# Patient Record
Sex: Female | Born: 1980 | Race: White | Hispanic: No | Marital: Married | Smoking: Never smoker
Health system: Southern US, Community
[De-identification: ages and names within clinical notes are randomized; demographics above are authoritative.]

## PROBLEM LIST (undated history)

## (undated) DIAGNOSIS — Z9221 Personal history of antineoplastic chemotherapy: Secondary | ICD-10-CM

## (undated) DIAGNOSIS — K219 Gastro-esophageal reflux disease without esophagitis: Secondary | ICD-10-CM

## (undated) DIAGNOSIS — J45909 Unspecified asthma, uncomplicated: Secondary | ICD-10-CM

## (undated) DIAGNOSIS — Z923 Personal history of irradiation: Secondary | ICD-10-CM

## (undated) DIAGNOSIS — C50411 Malignant neoplasm of upper-outer quadrant of right female breast: Secondary | ICD-10-CM

## (undated) DIAGNOSIS — Z170 Estrogen receptor positive status [ER+]: Secondary | ICD-10-CM

## (undated) DIAGNOSIS — R030 Elevated blood-pressure reading, without diagnosis of hypertension: Secondary | ICD-10-CM

## (undated) DIAGNOSIS — C50919 Malignant neoplasm of unspecified site of unspecified female breast: Secondary | ICD-10-CM

## (undated) DIAGNOSIS — Z1379 Encounter for other screening for genetic and chromosomal anomalies: Principal | ICD-10-CM

## (undated) HISTORY — PX: NO PAST SURGERIES: SHX2092

## (undated) HISTORY — DX: Encounter for other screening for genetic and chromosomal anomalies: Z13.79

## (undated) HISTORY — DX: Malignant neoplasm of unspecified site of unspecified female breast: C50.919

---

## 2015-11-28 ENCOUNTER — Encounter: Payer: Self-pay | Admitting: Podiatry

## 2015-11-28 ENCOUNTER — Ambulatory Visit (INDEPENDENT_AMBULATORY_CARE_PROVIDER_SITE_OTHER): Payer: 59

## 2015-11-28 ENCOUNTER — Ambulatory Visit (INDEPENDENT_AMBULATORY_CARE_PROVIDER_SITE_OTHER): Payer: 59 | Admitting: Podiatry

## 2015-11-28 VITALS — BP 119/86 | HR 110 | Resp 18

## 2015-11-28 DIAGNOSIS — M6740 Ganglion, unspecified site: Secondary | ICD-10-CM | POA: Diagnosis not present

## 2015-11-28 DIAGNOSIS — R52 Pain, unspecified: Secondary | ICD-10-CM

## 2015-11-28 NOTE — Patient Instructions (Signed)
Ganglion Cyst  A ganglion cyst is a noncancerous, fluid-filled lump that occurs near joints or tendons. The ganglion cyst grows out of a joint or the lining of a tendon. It most often develops in the hand or wrist, but it can also develop in the shoulder, elbow, hip, knee, ankle, or foot. The round or oval ganglion cyst can be the size of a pea or larger than a grape. Increased activity may enlarge the size of the cyst because more fluid starts to build up.   CAUSES  It is not known what causes a ganglion cyst to grow. However, it may be related to:  · Inflammation or irritation around the joint.  · An injury.  · Repetitive movements or overuse.  · Arthritis.  RISK FACTORS  Risk factors include:  · Being a woman.  · Being age 20-50.  SIGNS AND SYMPTOMS  Symptoms may include:   · A lump. This most often appears on the hand or wrist, but it can occur in other areas of the body.  · Tingling.  · Pain.  · Numbness.  · Muscle weakness.  · Weak grip.  · Less movement in a joint.  DIAGNOSIS  Ganglion cysts are most often diagnosed based on a physical exam. Your health care provider will feel the lump and may shine a light alongside it. If it is a ganglion cyst, a light often shines through it. Your health care provider may order an X-ray, ultrasound, or MRI to rule out other conditions.  TREATMENT  Ganglion cysts usually go away on their own without treatment. If pain or other symptoms are involved, treatment may be needed. Treatment is also needed if the ganglion cyst limits your movement or if it gets infected. Treatment may include:  · Wearing a brace or splint on your wrist or finger.  · Taking anti-inflammatory medicine.  · Draining fluid from the lump with a needle (aspiration).  · Injecting a steroid into the joint.  · Surgery to remove the ganglion cyst.  HOME CARE INSTRUCTIONS  · Do not press on the ganglion cyst, poke it with a needle, or hit it.  · Take medicines only as directed by your health care  provider.  · Wear your brace or splint as directed by your health care provider.  · Watch your ganglion cyst for any changes.  · Keep all follow-up visits as directed by your health care provider. This is important.  SEEK MEDICAL CARE IF:  · Your ganglion cyst becomes larger or more painful.  · You have increased redness, red streaks, or swelling.  · You have pus coming from the lump.  · You have weakness or numbness in the affected area.  · You have a fever or chills.     This information is not intended to replace advice given to you by your health care provider. Make sure you discuss any questions you have with your health care provider.     Document Released: 06/28/2000 Document Revised: 07/22/2014 Document Reviewed: 12/14/2013  Elsevier Interactive Patient Education ©2016 [employer]

## 2015-11-28 NOTE — Progress Notes (Signed)
   Subjective:    Patient ID: Anne Wu, female    DOB: October 08, 1980, 35 y.o.   MRN: [id number]  HPI  2 she'll female presents the also concerns of a knot on the top of her left foot which is been ongoing for about a month however she states that it does not hurt and does not cause any discomfort was shoes or with pressure. No redness over the area no recent injury or trauma. She is in no previous treatment. She states that she will get a check to see what it is.   Review of Systems  All other systems reviewed and are negative.      Objective:   Physical Exam General: AAO x3, NAD  Dermatological: Skin is warm, dry and supple bilateral. Nails x 10 are well manicured; remaining integument appears unremarkable at this time. There are no open sores, no preulcerative lesions, no rash or signs of infection present.  Vascular: Dorsalis Pedis artery and Posterior Tibial artery pedal pulses are 2/4 bilateral with immedate capillary fill time. Pedal hair growth present. No varicosities and no lower extremity edema present bilateral. There is no pain with calf compression, swelling, warmth, erythema.   Neruologic: Grossly intact via light touch bilateral. Vibratory intact via tuning fork bilateral. Protective threshold with Semmes Wienstein monofilament intact to all pedal sites bilateral. Patellar and Achilles deep tendon reflexes 2+ bilateral. No Babinski or clonus noted bilateral.   Musculoskeletal: On the dorsal lateral aspect of left foot approximately Lisfranc joint is a lateral 1.2 x 1 cm fluid-filled mobile soft tissue mass appears to be more of a ganglion cyst. There is a small bony exostosis palpable underneath this area. There is no overlying erythema or increase in warmth. There is no skin breakdown. No pain, crepitus, or limitation noted with foot and ankle range of motion bilateral. Muscular strength 5/5 in all groups tested bilateral.  Gait: Unassisted, Nonantalgic.       Assessment  & Plan:  35 year-old female left foot ganglion cyst likely -Treatment options discussed including all alternatives, risks, and complications -Etiology of symptoms were discussed -X-rays were obtained and reviewed with the patient. Nose of foreign body. No acute osseous deformity is present. -I discussed with the only way to definitively no is to remove the mass or to tried aspirated. She wishes to hold off on this for now. She'll continue to monitor. If it becomes painful or increases in size she'll call the office.  Celesta Gentile, DPM

## 2016-07-15 DIAGNOSIS — C50919 Malignant neoplasm of unspecified site of unspecified female breast: Secondary | ICD-10-CM

## 2016-07-15 HISTORY — DX: Malignant neoplasm of unspecified site of unspecified female breast: C50.919

## 2017-06-10 ENCOUNTER — Other Ambulatory Visit: Payer: Self-pay | Admitting: Nurse Practitioner

## 2017-06-10 DIAGNOSIS — N6310 Unspecified lump in the right breast, unspecified quadrant: Secondary | ICD-10-CM

## 2017-06-12 ENCOUNTER — Other Ambulatory Visit: Payer: Self-pay | Admitting: Nurse Practitioner

## 2017-06-12 ENCOUNTER — Ambulatory Visit: Payer: Self-pay | Admitting: Physician Assistant

## 2017-06-12 DIAGNOSIS — N6310 Unspecified lump in the right breast, unspecified quadrant: Secondary | ICD-10-CM

## 2017-06-20 ENCOUNTER — Other Ambulatory Visit: Payer: Self-pay | Admitting: Nurse Practitioner

## 2017-06-20 ENCOUNTER — Ambulatory Visit
Admission: RE | Admit: 2017-06-20 | Discharge: 2017-06-20 | Disposition: A | Discharge status: Home / Self Care | Payer: 59 | Source: Ambulatory Visit | Attending: Nurse Practitioner | Admitting: Nurse Practitioner

## 2017-06-20 ENCOUNTER — Encounter: Payer: Self-pay | Admitting: Radiology

## 2017-06-20 DIAGNOSIS — R928 Other abnormal and inconclusive findings on diagnostic imaging of breast: Secondary | ICD-10-CM

## 2017-06-20 DIAGNOSIS — N6310 Unspecified lump in the right breast, unspecified quadrant: Secondary | ICD-10-CM

## 2017-06-20 DIAGNOSIS — N63 Unspecified lump in unspecified breast: Secondary | ICD-10-CM | POA: Diagnosis present

## 2017-06-20 DIAGNOSIS — C50411 Malignant neoplasm of upper-outer quadrant of right female breast: Secondary | ICD-10-CM | POA: Diagnosis not present

## 2017-06-20 DIAGNOSIS — Z170 Estrogen receptor positive status [ER+]: Secondary | ICD-10-CM | POA: Diagnosis not present

## 2017-06-20 HISTORY — PX: BREAST BIOPSY: SHX20

## 2017-06-23 ENCOUNTER — Other Ambulatory Visit: Payer: Self-pay | Admitting: Pathology

## 2017-06-26 NOTE — Progress Notes (Signed)
  Oncology Nurse Navigator Documentation  Navigator Location: [facility] (06/26/17 1600)   )Navigator Encounter Type: Introductory phone call (06/26/17 1600)   Abnormal Finding Date: 06/20/17 (06/26/17 1600) Confirmed Diagnosis Date: 06/20/17 (06/26/17 1600)                   Barriers/Navigation Needs: Education;Coordination of Care (06/26/17 1600)   Interventions: Coordination of Care;Education (06/26/17 1600)   Coordination of Care: Appts (06/26/17 1600) Education Method: Written;Verbal (06/26/17 1600)                Time Spent with Patient: 90 (06/26/17 1600)   Introduced patient to IT trainer. Met with her after work to give her Breast Cancer Treatment Handbook/folder with hospital services.  Confirmed appointments with Dr. [name], and Dr. Tasia Catchings. Patient has strong family history of breast cancer.  Aunt tested BRCA negative.  Patient has information.

## 2017-07-01 NOTE — Progress Notes (Signed)
Hematology/Oncology Consult note [facility] [phone] [phone]   Patient Care Team: [name], NP as PCP - General (Nurse Practitioner)  REFERRING PROVIDER: [name], NP CHIEF COMPLAINTS/PURPOSE OF CONSULTATION:  Evaluation of breast cancer   HISTORY OF PRESENTING ILLNESS:  Anne Wu is a  36 y.o.  female with PMH listed below who was referred to me for evaluation of newly diagnosed breast cancer.  She has significant family history on both maternal side as well as paternal side. She felt a right breast mass 2 months ago and diagnostic mammogram showed right upper quandrant 2.2 cm mass, which was confirmed on [place] and biopsied. Right axillary no clinically suspicious lymph node. Pathology showed invasive mammary carcinoma, ER/PR positive,, HER2 IHC  equivocal, FISH negative.  Patient has been evaluated by Dr.Smith, lumpectomy vs mastectomy options were discussed.   Patient is married, no children. She has no complaints today. Parents and husband accompanied her to clinic today.   Review of Systems  Constitutional: Negative for chills, fever and weight loss.  HENT: Negative for hearing loss.   Eyes: Negative for blurred vision.  Respiratory: Negative for cough.   Cardiovascular: Negative for chest pain.  Gastrointestinal: Negative for heartburn.  Genitourinary: Negative for dysuria.  Musculoskeletal: Negative for myalgias.  Skin: Negative for rash.  Neurological: Negative for dizziness.  Endo/Heme/Allergies: Does not bruise/bleed easily.  Psychiatric/Behavioral: Negative for depression.    MEDICAL HISTORY:  Past Medical History:  Diagnosis Date  . Breast cancer ([place])     SURGICAL HISTORY: History reviewed. No pertinent surgical history.  SOCIAL HISTORY: Social History   Socioeconomic History  . Marital status: Married    Spouse name: Not on file  . Number of children: Not on file  . Years of  education: Not on file  . Highest education level: Not on file  Social Needs  . Financial resource strain: Not on file  . Food insecurity - worry: Not on file  . Food insecurity - inability: Not on file  . Transportation needs - medical: Not on file  . Transportation needs - non-medical: Not on file  Occupational History  . Not on file  Tobacco Use  . Smoking status: Never Smoker  . Smokeless tobacco: Never Used  Substance and Sexual Activity  . Alcohol use: No    Alcohol/week: 0.0 oz  . Drug use: No  . Sexual activity: Yes  Other Topics Concern  . Not on file  Social History Narrative  . Not on file    FAMILY HISTORY: Family History  Problem Relation Age of Onset  . Breast cancer Maternal Aunt   . Breast cancer Maternal Grandmother   . Breast cancer Cousin   . Cancer Maternal Uncle   . Breast cancer Paternal Aunt   . Breast cancer Paternal Grandmother     ALLERGIES:  is allergic to codeine.  MEDICATIONS:  Current Outpatient Medications  Medication Sig Dispense Refill  . acetaminophen (TYLENOL) 325 MG tablet Take 650 mg by mouth every 6 (six) hours as needed.    . Multiple Vitamin (MULTIVITAMIN) tablet Take 1 tablet by mouth daily.     No current facility-administered medications for this visit.      PHYSICAL EXAMINATION: ECOG PERFORMANCE STATUS: 0 - Asymptomatic Vitals:   07/02/17 1047  BP: (!) 148/93  Pulse: (!) 105  Resp: 16  Temp: 98.5 F (36.9 C)   Filed Weights   07/02/17 1047  Weight: 168 lb (76.2 kg)  Physical Exam  Constitutional: She is oriented to person, place, and time and well-developed, well-nourished, and in no distress. No distress.  HENT:  Head: Normocephalic and atraumatic.  Eyes: EOM are normal. Pupils are equal, round, and reactive to light. Left eye exhibits no discharge. No scleral icterus.  Neck: Normal range of motion. Neck supple. No JVD present.  Cardiovascular: Normal rate, regular rhythm and normal heart sounds.  No  murmur heard. Pulmonary/Chest: Effort normal and breath sounds normal. No respiratory distress.  Abdominal: Soft. Bowel sounds are normal. She exhibits no distension.  Musculoskeletal: Normal range of motion. She exhibits no edema.  Lymphadenopathy:    She has no cervical adenopathy.  Neurological: She is alert and oriented to person, place, and time.  Skin: Skin is warm and dry.  Psychiatric: Affect and judgment normal.  Breast exam was performed in seated and lying down position. No nipple discharge. Right breast 10'oclock palpable mobile 2cm mass. No evidence of right axillary adenopathy No evidence of any palpable mass on left breast. No evidence of left axillary adenopathy.    No results found for: WBC, HGB, HCT, MCV, PLT No results for input(s): NA, K, CL, CO2, GLUCOSE, BUN, CREATININE, CALCIUM, GFRNONAA, GFRAA, PROT, ALBUMIN, AST, ALT, ALKPHOS, BILITOT, BILIDIR, IBILI in the last 8760 hours.     ASSESSMENT & PLAN:  1. Malignant neoplasm of upper-outer quadrant of right breast in female, estrogen receptor positive ([place])    cT2N0M0, stage IIA disease, grade 2 Image results and pathological reports discussed with patient. Treatment options was pending on discussion with surgery.  Discussed with Dr.Smith and he feels that neoadjuvant chemotherapy may help to achieve better margin status. Will offer neoadjuvant chemotherapy with AC->T. Chemotherapy was discussed with patient and her family members. Patient does not desire fertility. Discussed with patient and advice her to see gyn physician to discuss about non hormone contraceptive options. Will test urine HCG prior to chemotherapy.   Refer to Ina for Medi port placement and chemotherapy class. Will meet with patient and discuss more details about chemotherapy    All questions were answered. The patient knows to call the clinic with any problems questions or concerns.  Return visit as scheduled.  Thank you for this kind  referral and the opportunity to participate in the care of this patient. A copy of today's note is routed to referring provider    [name], MD, PhD Hematology Oncology [facility] Pager- [phone] 07/02/2017

## 2017-07-02 ENCOUNTER — Encounter: Payer: Self-pay | Admitting: *Deleted

## 2017-07-02 ENCOUNTER — Encounter: Payer: Self-pay | Admitting: Oncology

## 2017-07-02 ENCOUNTER — Inpatient Hospital Stay: Payer: 59 | Attending: Oncology | Admitting: Oncology

## 2017-07-02 VITALS — BP 148/93 | HR 105 | Temp 98.5°F | Resp 16 | Ht 64.3 in | Wt 168.0 lb

## 2017-07-02 DIAGNOSIS — C50411 Malignant neoplasm of upper-outer quadrant of right female breast: Secondary | ICD-10-CM | POA: Diagnosis present

## 2017-07-02 DIAGNOSIS — Z170 Estrogen receptor positive status [ER+]: Secondary | ICD-10-CM

## 2017-07-02 LAB — SURGICAL PATHOLOGY

## 2017-07-02 NOTE — Progress Notes (Signed)
New consult for initial evaluation and treatment planning. Patient states that she feels well and denies having any pain.

## 2017-07-03 ENCOUNTER — Encounter: Payer: Self-pay | Admitting: Diagnostic Radiology

## 2017-07-06 ENCOUNTER — Encounter: Payer: Self-pay | Admitting: Oncology

## 2017-07-06 NOTE — Progress Notes (Signed)
START ON PATHWAY REGIMEN - Breast   Doxorubicin + Cyclophosphamide (AC):   A cycle is every 21 days:     Doxorubicin      Cyclophosphamide   **Always confirm dose/schedule in your pharmacy ordering system**    Paclitaxel 80 mg/m2 Weekly:   Administer weekly:     Paclitaxel   **Always confirm dose/schedule in your pharmacy ordering system**    Patient Characteristics: Preoperative or Nonsurgical Candidate (Clinical Staging), Neoadjuvant Therapy followed by Surgery, Invasive Disease, Chemotherapy, HER2 Negative/Unknown/Equivocal, ER Positive Therapeutic Status: Preoperative or Nonsurgical Candidate (Clinical Staging) AJCC M Category: cM0 AJCC Grade: G2 Breast Surgical Plan: Neoadjuvant Therapy followed by Surgery ER Status: Positive (+) AJCC 8 Stage Grouping: IB HER2 Status: Negative (-) AJCC T Category: cT2 AJCC N Category: cN0 PR Status: Positive (+) Intent of Therapy: Curative Intent, Discussed with Patient 

## 2017-07-09 ENCOUNTER — Other Ambulatory Visit: Payer: Self-pay

## 2017-07-09 ENCOUNTER — Encounter
Admission: RE | Admit: 2017-07-09 | Discharge: 2017-07-09 | Disposition: A | Discharge status: Home / Self Care | Payer: 59 | Source: Ambulatory Visit | Attending: Surgery | Admitting: Surgery

## 2017-07-09 HISTORY — DX: Unspecified asthma, uncomplicated: J45.909

## 2017-07-09 HISTORY — DX: Gastro-esophageal reflux disease without esophagitis: K21.9

## 2017-07-09 NOTE — Patient Instructions (Signed)
  Your procedure is scheduled on: 07-11-17 Report to Same Day Surgery 2nd floor medical mall [address] [facility] Entrance-take elevator on left to 2nd floor.  Check in with surgery information desk.) To find out your arrival time please call [phone] between 1PM - 3PM on 07-10-17  Remember: Instructions that are not followed completely may result in serious medical risk, up to and including death, or upon the discretion of your surgeon and anesthesiologist your surgery may need to be rescheduled.    _x___ 1. Do not eat food after midnight the night before your procedure. NO GUM OR CANDY AFTER MIDNIGHT. You may drink clear liquids up to 2 hours before you are scheduled to arrive at the hospital for your procedure.  Do not drink clear liquids within 2 hours of your scheduled arrival to the hospital.  Clear liquids include  --Water or Apple juice without pulp  --Clear carbohydrate beverage such as ClearFast or Gatorade  --Black Coffee or Clear Tea (No milk, no creamers, do not add anything to the coffee or Tea     __x__ 2. No Alcohol for 24 hours before or after surgery.   __x__3. No Smoking for 24 prior to surgery.   ____  4. Bring all medications with you on the day of surgery if instructed.    __x__ 5. Notify your doctor if there is any change in your medical condition     (cold, fever, infections).     Do not wear jewelry, make-up, hairpins, clips or nail polish.  Do not wear lotions, powders, or perfumes. You may wear deodorant.  Do not shave 48 hours prior to surgery. Men may shave face and neck.  Do not bring valuables to the hospital.    [facility] is not responsible for any belongings or valuables.               Contacts, dentures or bridgework may not be worn into surgery.  Leave your suitcase in the car. After surgery it may be brought to your room.  For patients admitted to the hospital, discharge time is determined by your treatment team.   Patients discharged the day of  surgery will not be allowed to drive home.  You will need someone to drive you home and stay with you the night of your procedure.     ____ Take anti-hypertensive listed below, cardiac, seizure, asthma, anti-reflux and psychiatric medicines. These include:  1. NONE  2.  3.  4.  5.  6.  ____Fleets enema or Magnesium Citrate as directed.   ____ Use CHG Soap or sage wipes as directed on instruction sheet   ____ Use inhalers on the day of surgery and bring to hospital day of surgery  ____ Stop Metformin and Janumet 2 days prior to surgery.    ____ Take 1/2 of usual insulin dose the night before surgery and none on the morning surgery.   ____ Follow recommendations from Cardiologist, Pulmonologist or PCP regarding stopping Aspirin, Coumadin, Plavix ,Eliquis, Effient, or Pradaxa, and Pletal.  X____Stop Anti-inflammatories such as Advil, Aleve, Ibuprofen, Motrin, Naproxen, Naprosyn, Goodies powders or aspirin products NOW-OK to take Tylenol-PT HAS ALREADY STOPPED HER IBUPROFEN   ____ Stop supplements until after surgery.     ____ Bring C-Pap to the hospital.

## 2017-07-10 ENCOUNTER — Encounter: Payer: Self-pay | Admitting: *Deleted

## 2017-07-10 MED ORDER — CEFAZOLIN SODIUM-DEXTROSE 2-4 GM/100ML-% IV SOLN
2.0000 g | Freq: Once | INTRAVENOUS | Status: AC
  Administered 2017-07-11: 2 g via INTRAVENOUS

## 2017-07-11 ENCOUNTER — Encounter
Admission: RE | Disposition: A | Discharge status: Home / Self Care | Payer: Self-pay | Source: Ambulatory Visit | Attending: Surgery

## 2017-07-11 ENCOUNTER — Ambulatory Visit: Payer: 59

## 2017-07-11 ENCOUNTER — Encounter: Payer: Self-pay | Admitting: Anesthesiology

## 2017-07-11 ENCOUNTER — Other Ambulatory Visit: Payer: Self-pay

## 2017-07-11 ENCOUNTER — Ambulatory Visit: Payer: 59 | Admitting: Certified Registered"

## 2017-07-11 ENCOUNTER — Ambulatory Visit
Admission: RE | Admit: 2017-07-11 | Discharge: 2017-07-11 | Disposition: A | Discharge status: Home / Self Care | Payer: 59 | Source: Ambulatory Visit | Attending: Surgery | Admitting: Surgery

## 2017-07-11 DIAGNOSIS — Z79899 Other long term (current) drug therapy: Secondary | ICD-10-CM | POA: Diagnosis not present

## 2017-07-11 DIAGNOSIS — Z170 Estrogen receptor positive status [ER+]: Principal | ICD-10-CM

## 2017-07-11 DIAGNOSIS — K219 Gastro-esophageal reflux disease without esophagitis: Secondary | ICD-10-CM | POA: Diagnosis not present

## 2017-07-11 DIAGNOSIS — C50911 Malignant neoplasm of unspecified site of right female breast: Secondary | ICD-10-CM | POA: Insufficient documentation

## 2017-07-11 DIAGNOSIS — J45909 Unspecified asthma, uncomplicated: Secondary | ICD-10-CM | POA: Insufficient documentation

## 2017-07-11 DIAGNOSIS — I10 Essential (primary) hypertension: Secondary | ICD-10-CM | POA: Diagnosis not present

## 2017-07-11 DIAGNOSIS — C50411 Malignant neoplasm of upper-outer quadrant of right female breast: Secondary | ICD-10-CM

## 2017-07-11 HISTORY — PX: PORTACATH PLACEMENT: SHX2246

## 2017-07-11 HISTORY — DX: Elevated blood-pressure reading, without diagnosis of hypertension: R03.0

## 2017-07-11 LAB — POCT PREGNANCY, URINE: Preg Test, Ur: NEGATIVE

## 2017-07-11 SURGERY — INSERTION, TUNNELED CENTRAL VENOUS DEVICE, WITH PORT
Anesthesia: General | Wound class: Clean

## 2017-07-11 MED ORDER — LIDOCAINE HCL (PF) 1 % IJ SOLN
INTRAMUSCULAR | Status: DC | PRN
  Administered 2017-07-11: 7 mL

## 2017-07-11 MED ORDER — HYDROCODONE-ACETAMINOPHEN 5-325 MG PO TABS
1.0000 | ORAL_TABLET | Freq: Once | ORAL | Status: AC
Start: 2017-07-11 — End: 2017-07-11
  Administered 2017-07-11: 1 via ORAL

## 2017-07-11 MED ORDER — FAMOTIDINE 20 MG PO TABS
20.0000 mg | ORAL_TABLET | Freq: Once | ORAL | Status: AC
  Administered 2017-07-11: 20 mg via ORAL

## 2017-07-11 MED ORDER — PROPOFOL 10 MG/ML IV BOLUS
INTRAVENOUS | Status: AC
  Filled 2017-07-11: qty 20

## 2017-07-11 MED ORDER — GLYCOPYRROLATE 0.2 MG/ML IJ SOLN
INTRAMUSCULAR | Status: AC
  Filled 2017-07-11: qty 1

## 2017-07-11 MED ORDER — CEFAZOLIN SODIUM-DEXTROSE 2-4 GM/100ML-% IV SOLN
INTRAVENOUS | Status: AC
  Filled 2017-07-11: qty 100

## 2017-07-11 MED ORDER — HYDROCODONE-ACETAMINOPHEN 5-325 MG PO TABS
ORAL_TABLET | ORAL | Status: AC
  Administered 2017-07-11: 1 via ORAL
  Filled 2017-07-11: qty 1

## 2017-07-11 MED ORDER — LIDOCAINE 2% (20 MG/ML) 5 ML SYRINGE
INTRAMUSCULAR | Status: DC | PRN
  Administered 2017-07-11: 50 mg via INTRAVENOUS

## 2017-07-11 MED ORDER — MIDAZOLAM HCL 2 MG/2ML IJ SOLN
INTRAMUSCULAR | Status: AC
  Filled 2017-07-11: qty 2

## 2017-07-11 MED ORDER — SODIUM CHLORIDE 0.9 % IJ SOLN
INTRAMUSCULAR | Status: AC
  Filled 2017-07-11: qty 50

## 2017-07-11 MED ORDER — PROPOFOL 10 MG/ML IV BOLUS
INTRAVENOUS | Status: DC | PRN
  Administered 2017-07-11: 20 mg via INTRAVENOUS

## 2017-07-11 MED ORDER — FAMOTIDINE 20 MG PO TABS
ORAL_TABLET | ORAL | Status: AC
  Filled 2017-07-11: qty 1

## 2017-07-11 MED ORDER — HYDROCODONE-ACETAMINOPHEN 5-325 MG PO TABS: 1.0000 | ORAL_TABLET | Freq: Four times a day (QID) | ORAL | 0 refills | Status: DC | PRN

## 2017-07-11 MED ORDER — DEXAMETHASONE SODIUM PHOSPHATE 10 MG/ML IJ SOLN
INTRAMUSCULAR | Status: DC | PRN
  Administered 2017-07-11: 5 mg via INTRAVENOUS

## 2017-07-11 MED ORDER — FENTANYL CITRATE (PF) 100 MCG/2ML IJ SOLN
INTRAMUSCULAR | Status: DC | PRN
  Administered 2017-07-11 (×4): 50 ug via INTRAVENOUS

## 2017-07-11 MED ORDER — LACTATED RINGERS IV SOLN
INTRAVENOUS | Status: DC
  Administered 2017-07-11: 13:00:00 via INTRAVENOUS

## 2017-07-11 MED ORDER — ONDANSETRON HCL 4 MG/2ML IJ SOLN
INTRAMUSCULAR | Status: AC
  Filled 2017-07-11: qty 2

## 2017-07-11 MED ORDER — LIDOCAINE HCL (PF) 2 % IJ SOLN
INTRAMUSCULAR | Status: AC
  Filled 2017-07-11: qty 10

## 2017-07-11 MED ORDER — GLYCOPYRROLATE 0.2 MG/ML IJ SOLN
INTRAMUSCULAR | Status: DC | PRN
  Administered 2017-07-11: 0.2 mg via INTRAVENOUS

## 2017-07-11 MED ORDER — ONDANSETRON HCL 4 MG/2ML IJ SOLN: 4.0000 mg | Freq: Once | INTRAMUSCULAR | Status: DC | PRN

## 2017-07-11 MED ORDER — FENTANYL CITRATE (PF) 100 MCG/2ML IJ SOLN: 25.0000 ug | INTRAMUSCULAR | Status: DC | PRN

## 2017-07-11 MED ORDER — MIDAZOLAM HCL 5 MG/5ML IJ SOLN
INTRAMUSCULAR | Status: DC | PRN
  Administered 2017-07-11 (×2): 2 mg via INTRAVENOUS

## 2017-07-11 MED ORDER — HEPARIN SODIUM (PORCINE) 5000 UNIT/ML IJ SOLN
INTRAMUSCULAR | Status: AC
  Filled 2017-07-11: qty 1

## 2017-07-11 MED ORDER — PROPOFOL 500 MG/50ML IV EMUL
INTRAVENOUS | Status: DC | PRN
  Administered 2017-07-11: 50 ug/kg/min via INTRAVENOUS

## 2017-07-11 MED ORDER — ONDANSETRON HCL 4 MG/2ML IJ SOLN
INTRAMUSCULAR | Status: DC | PRN
  Administered 2017-07-11: 4 mg via INTRAVENOUS

## 2017-07-11 MED ORDER — FENTANYL CITRATE (PF) 100 MCG/2ML IJ SOLN
INTRAMUSCULAR | Status: AC
  Filled 2017-07-11: qty 2

## 2017-07-11 MED ORDER — LIDOCAINE HCL (PF) 1 % IJ SOLN
INTRAMUSCULAR | Status: AC
  Filled 2017-07-11: qty 30

## 2017-07-11 SURGICAL SUPPLY — 23 items
CANISTER SUCT 1200ML W/VALVE (MISCELLANEOUS) ×3 IMPLANT
CHLORAPREP W/TINT 26ML (MISCELLANEOUS) ×3 IMPLANT
COVER LIGHT HANDLE STERIS (MISCELLANEOUS) ×6 IMPLANT
DERMABOND ADVANCED (GAUZE/BANDAGES/DRESSINGS) ×2
DERMABOND ADVANCED .7 DNX12 (GAUZE/BANDAGES/DRESSINGS) ×1 IMPLANT
DRAPE C-ARM XRAY 36X54 (DRAPES) ×3 IMPLANT
ELECT REM PT RETURN 9FT ADLT (ELECTROSURGICAL) ×3
ELECTRODE REM PT RTRN 9FT ADLT (ELECTROSURGICAL) ×1 IMPLANT
GLOVE BIO SURGEON STRL SZ7.5 (GLOVE) ×3 IMPLANT
GOWN STRL REUS W/ TWL LRG LVL3 (GOWN DISPOSABLE) ×3 IMPLANT
GOWN STRL REUS W/TWL LRG LVL3 (GOWN DISPOSABLE) ×6
KIT PORT POWER 8FR ISP CVUE (Miscellaneous) ×3 IMPLANT
KIT RM TURNOVER STRD PROC AR (KITS) ×3 IMPLANT
LABEL OR SOLS (LABEL) ×3 IMPLANT
NEEDLE FILTER BLUNT 18X 1/2SAF (NEEDLE) ×2
NEEDLE FILTER BLUNT 18X1 1/2 (NEEDLE) ×1 IMPLANT
PACK PORT-A-CATH (MISCELLANEOUS) ×3 IMPLANT
SUT MNCRL+ 5-0 UNDYED PC-3 (SUTURE) ×1 IMPLANT
SUT MONOCRYL 5-0 (SUTURE) ×2
SUT SILK 4 0 SH (SUTURE) ×3 IMPLANT
SUT VIC AB 5-0 RB1 27 (SUTURE) ×3 IMPLANT
SYR 10ML LL (SYRINGE) ×3 IMPLANT
SYR 3ML LL SCALE MARK (SYRINGE) ×3 IMPLANT

## 2017-07-11 NOTE — H&P (Signed)
  She comes in today prepared for insertion of central venous catheter with subcutaneous infusion port.  We have reviewed her recent findings of right breast cancer.  Plan is to insert the catheter on the left side.  The left side was marked YES.  She reports no change in overall condition since the recent office visit.  I reviewed her recent lab work done on November 27.  I discussed the plan for surgery and also in anticipation of neoadjuvant chemotherapy

## 2017-07-11 NOTE — Patient Instructions (Signed)
Doxorubicin injection What is this medicine? DOXORUBICIN (dox oh ROO bi sin) is a chemotherapy drug. It is used to treat many kinds of cancer like leukemia, lymphoma, neuroblastoma, sarcoma, and Wilms' tumor. It is also used to treat bladder cancer, breast cancer, lung cancer, ovarian cancer, stomach cancer, and thyroid cancer. This medicine may be used for other purposes; ask your health care provider or pharmacist if you have questions. COMMON BRAND NAME(S): Adriamycin, Adriamycin PFS, Adriamycin RDF, Rubex What should I tell my health care provider before I take this medicine? They need to know if you have any of these conditions: -heart disease -history of low blood counts caused by a medicine -liver disease -recent or ongoing radiation therapy -an unusual or allergic reaction to doxorubicin, other chemotherapy agents, other medicines, foods, dyes, or preservatives -pregnant or trying to get pregnant -breast-feeding How should I use this medicine? This drug is given as an infusion into a vein. It is administered in a hospital or clinic by a specially trained health care professional. If you have pain, swelling, burning or any unusual feeling around the site of your injection, tell your health care professional right away. Talk to your pediatrician regarding the use of this medicine in children. Special care may be needed. Overdosage: If you think you have taken too much of this medicine contact a poison control center or emergency room at once. NOTE: This medicine is only for you. Do not share this medicine with others. What if I miss a dose? It is important not to miss your dose. Call your doctor or health care professional if you are unable to keep an appointment. What may interact with this medicine? This medicine may interact with the following medications: -6-mercaptopurine -paclitaxel -phenytoin -St. John's Wort -trastuzumab -verapamil This list may not describe all possible  interactions. Give your health care provider a list of all the medicines, herbs, non-prescription drugs, or dietary supplements you use. Also tell them if you smoke, drink alcohol, or use illegal drugs. Some items may interact with your medicine. What should I watch for while using this medicine? This drug may make you feel generally unwell. This is not uncommon, as chemotherapy can affect healthy cells as well as cancer cells. Report any side effects. Continue your course of treatment even though you feel ill unless your doctor tells you to stop. There is a maximum amount of this medicine you should receive throughout your life. The amount depends on the medical condition being treated and your overall health. Your doctor will watch how much of this medicine you receive in your lifetime. Tell your doctor if you have taken this medicine before. You may need blood work done while you are taking this medicine. Your urine may turn red for a few days after your dose. This is not blood. If your urine is dark or brown, call your doctor. In some cases, you may be given additional medicines to help with side effects. Follow all directions for their use. Call your doctor or health care professional for advice if you get a fever, chills or sore throat, or other symptoms of a cold or flu. Do not treat yourself. This drug decreases your body's ability to fight infections. Try to avoid being around people who are sick. This medicine may increase your risk to bruise or bleed. Call your doctor or health care professional if you notice any unusual bleeding. Talk to your doctor about your risk of cancer. You may be more at risk for certain   types of cancers if you take this medicine. Do not become pregnant while taking this medicine or for 6 months after stopping it. Women should inform their doctor if they wish to become pregnant or think they might be pregnant. Men should not father a child while taking this medicine and  for 6 months after stopping it. There is a potential for serious side effects to an unborn child. Talk to your health care professional or pharmacist for more information. Do not breast-feed an infant while taking this medicine. This medicine has caused ovarian failure in some women and reduced sperm counts in some men This medicine may interfere with the ability to have a child. Talk with your doctor or health care professional if you are concerned about your fertility. What side effects may I notice from receiving this medicine? Side effects that you should report to your doctor or health care professional as soon as possible: -allergic reactions like skin rash, itching or hives, swelling of the face, lips, or tongue -breathing problems -chest pain -fast or irregular heartbeat -low blood counts - this medicine may decrease the number of white blood cells, red blood cells and platelets. You may be at increased risk for infections and bleeding. -pain, redness, or irritation at site where injected -signs of infection - fever or chills, cough, sore throat, pain or difficulty passing urine -signs of decreased platelets or bleeding - bruising, pinpoint red spots on the skin, black, tarry stools, blood in the urine -swelling of the ankles, feet, hands -tiredness -weakness Side effects that usually do not require medical attention (report to your doctor or health care professional if they continue or are bothersome): -diarrhea -hair loss -mouth sores -nail discoloration or damage -nausea -red colored urine -vomiting This list may not describe all possible side effects. Call your doctor for medical advice about side effects. You may report side effects to FDA at 1-800-FDA-1088. Where should I keep my medicine? This drug is given in a hospital or clinic and will not be stored at home. NOTE: This sheet is a summary. It may not cover all possible information. If you have questions about this  medicine, talk to your doctor, pharmacist, or health care provider.  2018 Elsevier/Gold Standard (2015-08-28 11:28:51) Cyclophosphamide injection What is this medicine? CYCLOPHOSPHAMIDE (sye kloe FOSS fa mide) is a chemotherapy drug. It slows the growth of cancer cells. This medicine is used to treat many types of cancer like lymphoma, myeloma, leukemia, breast cancer, and ovarian cancer, to name a few. This medicine may be used for other purposes; ask your health care provider or pharmacist if you have questions. COMMON BRAND NAME(S): Cytoxan, Neosar What should I tell my health care provider before I take this medicine? They need to know if you have any of these conditions: -blood disorders -history of other chemotherapy -infection -kidney disease -liver disease -recent or ongoing radiation therapy -tumors in the bone marrow -an unusual or allergic reaction to cyclophosphamide, other chemotherapy, other medicines, foods, dyes, or preservatives -pregnant or trying to get pregnant -breast-feeding How should I use this medicine? This drug is usually given as an injection into a vein or muscle or by infusion into a vein. It is administered in a hospital or clinic by a specially trained health care professional. Talk to your pediatrician regarding the use of this medicine in children. Special care may be needed. Overdosage: If you think you have taken too much of this medicine contact a poison control center or emergency room at   once. NOTE: This medicine is only for you. Do not share this medicine with others. What if I miss a dose? It is important not to miss your dose. Call your doctor or health care professional if you are unable to keep an appointment. What may interact with this medicine? This medicine may interact with the following medications: -amiodarone -amphotericin B -azathioprine -certain antiviral medicines for HIV or AIDS such as protease inhibitors (e.g., indinavir,  ritonavir) and zidovudine -certain blood pressure medications such as benazepril, captopril, enalapril, fosinopril, lisinopril, moexipril, monopril, perindopril, quinapril, ramipril, trandolapril -certain cancer medications such as anthracyclines (e.g., daunorubicin, doxorubicin), busulfan, cytarabine, paclitaxel, pentostatin, tamoxifen, trastuzumab -certain diuretics such as chlorothiazide, chlorthalidone, hydrochlorothiazide, indapamide, metolazone -certain medicines that treat or prevent blood clots like warfarin -certain muscle relaxants such as succinylcholine -cyclosporine -etanercept -indomethacin -medicines to increase blood counts like filgrastim, pegfilgrastim, sargramostim -medicines used as general anesthesia -metronidazole -natalizumab This list may not describe all possible interactions. Give your health care provider a list of all the medicines, herbs, non-prescription drugs, or dietary supplements you use. Also tell them if you smoke, drink alcohol, or use illegal drugs. Some items may interact with your medicine. What should I watch for while using this medicine? Visit your doctor for checks on your progress. This drug may make you feel generally unwell. This is not uncommon, as chemotherapy can affect healthy cells as well as cancer cells. Report any side effects. Continue your course of treatment even though you feel ill unless your doctor tells you to stop. Drink water or other fluids as directed. Urinate often, even at night. In some cases, you may be given additional medicines to help with side effects. Follow all directions for their use. Call your doctor or health care professional for advice if you get a fever, chills or sore throat, or other symptoms of a cold or flu. Do not treat yourself. This drug decreases your body's ability to fight infections. Try to avoid being around people who are sick. This medicine may increase your risk to bruise or bleed. Call your doctor or  health care professional if you notice any unusual bleeding. Be careful brushing and flossing your teeth or using a toothpick because you may get an infection or bleed more easily. If you have any dental work done, tell your dentist you are receiving this medicine. You may get drowsy or dizzy. Do not drive, use machinery, or do anything that needs mental alertness until you know how this medicine affects you. Do not become pregnant while taking this medicine or for 1 year after stopping it. Women should inform their doctor if they wish to become pregnant or think they might be pregnant. Men should not father a child while taking this medicine and for 4 months after stopping it. There is a potential for serious side effects to an unborn child. Talk to your health care professional or pharmacist for more information. Do not breast-feed an infant while taking this medicine. This medicine may interfere with the ability to have a child. This medicine has caused ovarian failure in some women. This medicine has caused reduced sperm counts in some men. You should talk with your doctor or health care professional if you are concerned about your fertility. If you are going to have surgery, tell your doctor or health care professional that you have taken this medicine. What side effects may I notice from receiving this medicine? Side effects that you should report to your doctor or health care professional as   soon as possible: -allergic reactions like skin rash, itching or hives, swelling of the face, lips, or tongue -low blood counts - this medicine may decrease the number of white blood cells, red blood cells and platelets. You may be at increased risk for infections and bleeding. -signs of infection - fever or chills, cough, sore throat, pain or difficulty passing urine -signs of decreased platelets or bleeding - bruising, pinpoint red spots on the skin, black, tarry stools, blood in the urine -signs of  decreased red blood cells - unusually weak or tired, fainting spells, lightheadedness -breathing problems -dark urine -dizziness -palpitations -swelling of the ankles, feet, hands -trouble passing urine or change in the amount of urine -weight gain -yellowing of the eyes or skin Side effects that usually do not require medical attention (report to your doctor or health care professional if they continue or are bothersome): -changes in nail or skin color -hair loss -missed menstrual periods -mouth sores -nausea, vomiting This list may not describe all possible side effects. Call your doctor for medical advice about side effects. You may report side effects to FDA at 1-800-FDA-1088. Where should I keep my medicine? This drug is given in a hospital or clinic and will not be stored at home. NOTE: This sheet is a summary. It may not cover all possible information. If you have questions about this medicine, talk to your doctor, pharmacist, or health care provider.  2018 Elsevier/Gold Standard (2012-05-15 16:22:58) Paclitaxel injection What is this medicine? PACLITAXEL (PAK li TAX el) is a chemotherapy drug. It targets fast dividing cells, like cancer cells, and causes these cells to die. This medicine is used to treat ovarian cancer, breast cancer, and other cancers. This medicine may be used for other purposes; ask your health care provider or pharmacist if you have questions. COMMON BRAND NAME(S): Onxol, Taxol What should I tell my health care provider before I take this medicine? They need to know if you have any of these conditions: -blood disorders -irregular heartbeat -infection (especially a virus infection such as chickenpox, cold sores, or herpes) -liver disease -previous or ongoing radiation therapy -an unusual or allergic reaction to paclitaxel, alcohol, polyoxyethylated castor oil, other chemotherapy agents, other medicines, foods, dyes, or preservatives -pregnant or trying to  get pregnant -breast-feeding How should I use this medicine? This drug is given as an infusion into a vein. It is administered in a hospital or clinic by a specially trained health care professional. Talk to your pediatrician regarding the use of this medicine in children. Special care may be needed. Overdosage: If you think you have taken too much of this medicine contact a poison control center or emergency room at once. NOTE: This medicine is only for you. Do not share this medicine with others. What if I miss a dose? It is important not to miss your dose. Call your doctor or health care professional if you are unable to keep an appointment. What may interact with this medicine? Do not take this medicine with any of the following medications: -disulfiram -metronidazole This medicine may also interact with the following medications: -cyclosporine -diazepam -ketoconazole -medicines to increase blood counts like filgrastim, pegfilgrastim, sargramostim -other chemotherapy drugs like cisplatin, doxorubicin, epirubicin, etoposide, teniposide, vincristine -quinidine -testosterone -vaccines -verapamil Talk to your doctor or health care professional before taking any of these medicines: -acetaminophen -aspirin -ibuprofen -ketoprofen -naproxen This list may not describe all possible interactions. Give your health care provider a list of all the medicines, herbs, non-prescription drugs, or   dietary supplements you use. Also tell them if you smoke, drink alcohol, or use illegal drugs. Some items may interact with your medicine. What should I watch for while using this medicine? Your condition will be monitored carefully while you are receiving this medicine. You will need important blood work done while you are taking this medicine. This medicine can cause serious allergic reactions. To reduce your risk you will need to take other medicine(s) before treatment with this medicine. If you  experience allergic reactions like skin rash, itching or hives, swelling of the face, lips, or tongue, tell your doctor or health care professional right away. In some cases, you may be given additional medicines to help with side effects. Follow all directions for their use. This drug may make you feel generally unwell. This is not uncommon, as chemotherapy can affect healthy cells as well as cancer cells. Report any side effects. Continue your course of treatment even though you feel ill unless your doctor tells you to stop. Call your doctor or health care professional for advice if you get a fever, chills or sore throat, or other symptoms of a cold or flu. Do not treat yourself. This drug decreases your body's ability to fight infections. Try to avoid being around people who are sick. This medicine may increase your risk to bruise or bleed. Call your doctor or health care professional if you notice any unusual bleeding. Be careful brushing and flossing your teeth or using a toothpick because you may get an infection or bleed more easily. If you have any dental work done, tell your dentist you are receiving this medicine. Avoid taking products that contain aspirin, acetaminophen, ibuprofen, naproxen, or ketoprofen unless instructed by your doctor. These medicines may hide a fever. Do not become pregnant while taking this medicine. Women should inform their doctor if they wish to become pregnant or think they might be pregnant. There is a potential for serious side effects to an unborn child. Talk to your health care professional or pharmacist for more information. Do not breast-feed an infant while taking this medicine. Men are advised not to father a child while receiving this medicine. This product may contain alcohol. Ask your pharmacist or healthcare provider if this medicine contains alcohol. Be sure to tell all healthcare providers you are taking this medicine. Certain medicines, like metronidazole  and disulfiram, can cause an unpleasant reaction when taken with alcohol. The reaction includes flushing, headache, nausea, vomiting, sweating, and increased thirst. The reaction can last from 30 minutes to several hours. What side effects may I notice from receiving this medicine? Side effects that you should report to your doctor or health care professional as soon as possible: -allergic reactions like skin rash, itching or hives, swelling of the face, lips, or tongue -low blood counts - This drug may decrease the number of white blood cells, red blood cells and platelets. You may be at increased risk for infections and bleeding. -signs of infection - fever or chills, cough, sore throat, pain or difficulty passing urine -signs of decreased platelets or bleeding - bruising, pinpoint red spots on the skin, black, tarry stools, nosebleeds -signs of decreased red blood cells - unusually weak or tired, fainting spells, lightheadedness -breathing problems -chest pain -high or low blood pressure -mouth sores -nausea and vomiting -pain, swelling, redness or irritation at the injection site -pain, tingling, numbness in the hands or feet -slow or irregular heartbeat -swelling of the ankle, feet, hands Side effects that usually do not   require medical attention (report to your doctor or health care professional if they continue or are bothersome): -bone pain -complete hair loss including hair on your head, underarms, pubic hair, eyebrows, and eyelashes -changes in the color of fingernails -diarrhea -loosening of the fingernails -loss of appetite -muscle or joint pain -red flush to skin -sweating This list may not describe all possible side effects. Call your doctor for medical advice about side effects. You may report side effects to FDA at 1-800-FDA-1088. Where should I keep my medicine? This drug is given in a hospital or clinic and will not be stored at home. NOTE: This sheet is a summary. It  may not cover all possible information. If you have questions about this medicine, talk to your doctor, pharmacist, or health care provider.  2018 Elsevier/Gold Standard (2015-05-02 19:58:00)  

## 2017-07-11 NOTE — Anesthesia Postprocedure Evaluation (Signed)
Anesthesia Post Note  Patient: Anne Wu  Procedure(s) Performed: INSERTION PORT-A-CATH (N/A )  Patient location during evaluation: PACU Anesthesia Type: General Level of consciousness: awake and alert Pain management: pain level controlled Vital Signs Assessment: post-procedure vital signs reviewed and stable Respiratory status: spontaneous breathing, nonlabored ventilation, respiratory function stable and patient connected to nasal cannula oxygen Cardiovascular status: blood pressure returned to baseline and stable Postop Assessment: no apparent nausea or vomiting Anesthetic complications: no     Last Vitals:  Vitals:   07/11/17 1628 07/11/17 1637  BP: 124/74 118/67  Pulse: 85 94  Resp: 14 18  Temp: 37.1 C 37.3 C  SpO2: 100% 100%    Last Pain:  Vitals:   07/11/17 1637  TempSrc:   PainSc: 3                  Precious Haws Andrina Locken

## 2017-07-11 NOTE — Discharge Instructions (Signed)
Take Tylenol or Norco if needed for pain.  Should not drive or do anything dangerous when taking Norco.  May shower and blot dry.

## 2017-07-11 NOTE — Op Note (Addendum)
OPERATIVE REPORT  PREOPERATIVE DIAGNOSIS: Breast cancer  POSTOPERATIVE DIAGNOSIS: Breast cancer  PROCEDURE: Insertion of central venous catheter with subcutaneous infusion port.  ANESTHESIA: Local with monitored anesthesia care.  SURGEON: Rochel Brome MD  With the patient on the operating table in the supine position a rolled sheet was placed behind the shoulder blades to extend the neck. She was monitored by the anesthesia staff and sedated. The neck and chest wall were prepared with ChloraPrep and draped in a sterile manner.  The skin beneath the left clavicle was infiltrated with 1% Xylocaine. A transversely oriented 3 cm incision was made and carried down through subcutaneous tissues. Several small bleeding points were cauterized. A subcutaneous pouch was created large enough to admit the ClearView port. The jugular vein was identified with ultrasound.  The vein appeared smaller than the carotid artery and the patient was placed in Trendelenburg position.  The vein continued to look small in size.  It was compressible.  The skin overlying the jugular vein was infiltrated with 1% Xylocaine. A transversely oriented 5 mm incision was made and carried down through subcutaneous tissues. A needle was inserted into the jugular vein using ultrasound guidance.  Venous blood was aspirated.. A guidewire was advanced down through the needle however the catheter would not thread into central circulation.  The guidewire was removed.  Following this additional study with ultrasound poorly demonstrated the jugular vein and on a second attempt was not able to aspirate blood.  Therefore a needle was inserted into the subclavian vein.  A guidewire was inserted.. Fluoroscopy was used to demonstrate the location of the guidewire in the vena cava. The dilator and introducer sheath were advanced over the guidewire. The guidewire and dilator were removed. The catheter was placed down through the sheath and the sheath was  peeled away. Fluoroscopy was used to demonstrate the tip of the catheter in the superior vena cava. An image was saved for the paper chart..  The catheter was cut to fit and attached to the [place] port.  The port was placed into the subcutaneous pouch and sutured to the deep fascia with 4-0 silk.  A Huber needle was introduced into the port and aspirated a trace of blood and flushed with 10 cc of saline.  The needle was removed.  Hemostasis was intact. Subcutaneous tissues were approximated with 5-0 Vicryl l. Both skin incisions were closed with 5-0 Monocryl subcuticular suture and Dermabond. The patient tolerated surgery satisfactorily and was prepared for transfer to the recovery room.  Rochel Brome MD  ADDENDUM: The postoperative chest x-ray demonstrates good position of the catheter in the subclavian vein.  Lung fields are clear.  Darden Amber MD

## 2017-07-11 NOTE — Transfer of Care (Signed)
Immediate Anesthesia Transfer of Care Note  Patient: Anne Wu  Procedure(s) Performed: INSERTION PORT-A-CATH (N/A )  Patient Location: PACU  Anesthesia Type:General  Level of Consciousness: awake and alert   Airway & Oxygen Therapy: Patient Spontanous Breathing  Post-op Assessment: Report given to RN and Post -op Vital signs reviewed and stable  Post vital signs: Reviewed  Last Vitals:  Vitals:   07/11/17 1231 07/11/17 1526  BP: (!) 147/81 113/73  Pulse: (!) 101   Resp: 16 15  Temp: 36.8 C 36.8 C  SpO2: 100% 100%    Last Pain:  Vitals:   07/11/17 1231  TempSrc: Temporal         Complications: No apparent anesthesia complications

## 2017-07-11 NOTE — Anesthesia Preprocedure Evaluation (Addendum)
Anesthesia Evaluation  Patient identified by MRN, date of birth, ID band Patient awake    Reviewed: Allergy & Precautions, NPO status , Patient's Chart, lab work & pertinent test results, reviewed documented beta blocker date and time   Airway Mallampati: III  TM Distance: >3 FB     Dental  (+) Chipped   Pulmonary asthma ,           Cardiovascular hypertension, Pt. on medications      Neuro/Psych    GI/Hepatic GERD  Controlled,  Endo/Other    Renal/GU      Musculoskeletal   Abdominal   Peds  Hematology   Anesthesia Other Findings One broken tooth.  Reproductive/Obstetrics                           Anesthesia Physical Anesthesia Plan  ASA: II  Anesthesia Plan: MAC   Post-op Pain Management:    Induction:   PONV Risk Score and Plan:   Airway Management Planned:   Additional Equipment:   Intra-op Plan:   Post-operative Plan:   Informed Consent: I have reviewed the patients History and Physical, chart, labs and discussed the procedure including the risks, benefits and alternatives for the proposed anesthesia with the patient or authorized representative who has indicated his/her understanding and acceptance.     Plan Discussed with: CRNA  Anesthesia Plan Comments:         Anesthesia Quick Evaluation

## 2017-07-11 NOTE — Anesthesia Post-op Follow-up Note (Signed)
Anesthesia QCDR form completed.        

## 2017-07-13 ENCOUNTER — Encounter: Payer: Self-pay | Admitting: Surgery

## 2017-07-15 DIAGNOSIS — C50411 Malignant neoplasm of upper-outer quadrant of right female breast: Secondary | ICD-10-CM

## 2017-07-15 DIAGNOSIS — Z170 Estrogen receptor positive status [ER+]: Secondary | ICD-10-CM

## 2017-07-15 HISTORY — DX: Estrogen receptor positive status (ER+): Z17.0

## 2017-07-15 HISTORY — DX: Malignant neoplasm of upper-outer quadrant of right female breast: C50.411

## 2017-07-15 NOTE — Progress Notes (Signed)
Hematology/Oncology Follow up note [facility] Telephone:(336) [phone] Fax:(336) [phone]   Patient Care Team: [name], NP as PCP - General (Nurse Practitioner)  REFERRING PROVIDER: [name], NP CHIEF COMPLAINTS/PURPOSE OF CONSULTATION:  Evaluation of breast cancer   HISTORY OF PRESENTING ILLNESS:  Anne Wu is a  37 y.o.  female with PMH listed below who was referred to me for evaluation of newly diagnosed breast cancer.  She has significant family history on both maternal side as well as paternal side. She felt a right breast mass 2 months ago and diagnostic mammogram showed right upper quandrant 2.2 cm mass, which was confirmed on [place] and biopsied. Right axillary no clinically suspicious lymph node. Pathology showed invasive mammary carcinoma, ER/PR positive,, HER2 IHC  equivocal, FISH negative.  Patient has been evaluated by Dr.Smith, lumpectomy vs mastectomy options were discussed.  Patient is married, no children. She has no complaints today. Parents and husband accompanied her to clinic today.   INTERVAL HISTORY Anne Wu is a 37 y.o. female who has above history reviewed by me today presents for follow up visit for management of breast cancer She has no new compliant today   Review of Systems  Constitutional: Negative for chills, fever and weight loss.  HENT: Negative for hearing loss.   Eyes: Negative for blurred vision.  Respiratory: Negative for cough.   Cardiovascular: Negative for chest pain.  Gastrointestinal: Negative for heartburn.  Genitourinary: Negative for dysuria.  Musculoskeletal: Negative for myalgias.  Skin: Negative for rash.  Neurological: Negative for dizziness.  Endo/Heme/Allergies: Does not bruise/bleed easily.  Psychiatric/Behavioral: Negative for depression.    MEDICAL HISTORY:  Past Medical History:  Diagnosis Date  . Asthma    AS A CHILD-NO INHALERS  . Breast cancer ([place])   . Elevated blood  pressure, situational    PT STATES HER LAST COUPLE OF MD APPOINTMENTS SHE HAS HAD ELEVATED BP-NEVER HAD A PROBLEM WITH THIS PREVIOUSLY  . GERD (gastroesophageal reflux disease)    OCC- NO MEDS    SURGICAL HISTORY: Past Surgical History:  Procedure Laterality Date  . NO PAST SURGERIES    . PORTACATH PLACEMENT N/A 07/11/2017   Procedure: INSERTION PORT-A-CATH;  Surgeon: Leonie Green, MD;  Location: [facility] ORS;  Service: General;  Laterality: N/A;    SOCIAL HISTORY: Social History   Socioeconomic History  . Marital status: Married    Spouse name: Not on file  . Number of children: Not on file  . Years of education: Not on file  . Highest education level: Not on file  Social Needs  . Financial resource strain: Not on file  . Food insecurity - worry: Not on file  . Food insecurity - inability: Not on file  . Transportation needs - medical: Not on file  . Transportation needs - non-medical: Not on file  Occupational History  . Not on file  Tobacco Use  . Smoking status: Never Smoker  . Smokeless tobacco: Never Used  Substance and Sexual Activity  . Alcohol use: No    Alcohol/week: 0.0 oz  . Drug use: No  . Sexual activity: Yes  Other Topics Concern  . Not on file  Social History Narrative  . Not on file    FAMILY HISTORY: Family History  Problem Relation Age of Onset  . Breast cancer Maternal Aunt   . Breast cancer Maternal Grandmother   . Breast cancer Cousin   . Cancer Maternal Uncle   . Breast cancer Paternal Aunt   .  Breast cancer Paternal Grandmother     ALLERGIES:  is allergic to codeine.  MEDICATIONS:  Current Outpatient Medications  Medication Sig Dispense Refill  . acetaminophen (TYLENOL) 325 MG tablet Take 650 mg by mouth every 6 (six) hours as needed (for pain.).     Marland Kitchen HYDROcodone-acetaminophen (NORCO) 5-325 MG tablet Take 1-2 tablets by mouth every 6 (six) hours as needed for moderate pain. 6 tablet 0  . ibuprofen (ADVIL,MOTRIN) 200 MG tablet  Take 400 mg by mouth every 8 (eight) hours as needed (for pain.).    Marland Kitchen Multiple Vitamin (MULTIVITAMIN WITH MINERALS) TABS tablet Take 1 tablet by mouth daily.     No current facility-administered medications for this visit.      PHYSICAL EXAMINATION: ECOG PERFORMANCE STATUS: 0 - Asymptomatic There were no vitals filed for this visit. There were no vitals filed for this visit.  Physical Exam  Constitutional: She is oriented to person, place, and time and well-developed, well-nourished, and in no distress. No distress.  HENT:  Head: Normocephalic and atraumatic.  Mouth/Throat: No oropharyngeal exudate.  Eyes: EOM are normal. Pupils are equal, round, and reactive to light. Left eye exhibits no discharge. No scleral icterus.  Neck: Normal range of motion. Neck supple. No JVD present.  Cardiovascular: Normal rate, regular rhythm and normal heart sounds.  No murmur heard. Pulmonary/Chest: Effort normal and breath sounds normal. No respiratory distress. She has no wheezes.  Abdominal: Soft. Bowel sounds are normal. She exhibits no distension. There is no tenderness.  Musculoskeletal: Normal range of motion. She exhibits no edema or tenderness.  Lymphadenopathy:    She has no cervical adenopathy.  Neurological: She is alert and oriented to person, place, and time. No cranial nerve deficit.  Skin: Skin is warm and dry. No rash noted.  Psychiatric: Affect and judgment normal.  Breast exam was performed in seated and lying down position. No nipple discharge. Right breast 10'oclock palpable mobile 2cm mass. No evidence of right axillary adenopathy No evidence of any palpable mass on left breast. No evidence of left axillary adenopathy.    Lab Results  Component Value Date   WBC 12.4 (H) 07/16/2017   HGB 14.3 07/16/2017   HCT 42.5 07/16/2017   MCV 86.5 07/16/2017   PLT 382 07/16/2017   Recent Labs    07/16/17 1620  NA 138  K 4.3  CL 102  CO2 26  GLUCOSE 115*  BUN 12  CREATININE  0.95  CALCIUM 9.1  GFRNONAA >60  GFRAA >60  PROT 7.7  ALBUMIN 4.2  AST 17  ALT 14  ALKPHOS 62  BILITOT 0.1*       ASSESSMENT & PLAN:  1. Malignant neoplasm of upper-outer quadrant of right breast in female, estrogen receptor positive ([place])    cT2N0M0, stage IIA disease, grade 2 Discussed with Dr.Smith, from surgical standpoint, Dr.Smith recommend neoadjuvant chemotherapy to facilitate surgery to obtain negative margin.  Also given patient's young age, neoadjuvant chemotherapy is reasonable.   Will offer neoadjuvant chemotherapy with AC->T. Patient stated that she does not desire futher fertility.   I explained to the patient the risks and benefits of chemotherapy including all but not limited to hair loss, mouth sore, nausea, vomiting, low blood counts, bleeding, heart failure, neuropathy and risk of life threatening infection and even death, secondary malignancy etc. Patient is made aware of cold cap system and will update me if she is interested.   # Tentatively planning starting chemotherapy on 07/19/2017.  # CBC, CMP, urine  HCG prior to each cycle.  # refer for genetic counseling.   Discussed with patient and advice her to see gyn physician to discuss about non hormone contraceptive options.  # 2D echo prior to chemotherapy.   All questions were answered. The patient knows to call the clinic with any problems questions or concerns.  Return visit 07/19/2016 prior to chemotherapy    [name], MD, PhD Hematology Oncology [facility] at [facility] Pager- [phone] 07/17/2017

## 2017-07-16 ENCOUNTER — Inpatient Hospital Stay: Payer: 59 | Attending: Oncology | Admitting: Oncology

## 2017-07-16 ENCOUNTER — Other Ambulatory Visit: Payer: Self-pay

## 2017-07-16 ENCOUNTER — Inpatient Hospital Stay: Payer: 59

## 2017-07-16 VITALS — BP 143/84 | HR 103 | Temp 98.3°F | Resp 18 | Wt 189.5 lb

## 2017-07-16 DIAGNOSIS — R030 Elevated blood-pressure reading, without diagnosis of hypertension: Secondary | ICD-10-CM | POA: Insufficient documentation

## 2017-07-16 DIAGNOSIS — Z170 Estrogen receptor positive status [ER+]: Secondary | ICD-10-CM | POA: Diagnosis not present

## 2017-07-16 DIAGNOSIS — Z5111 Encounter for antineoplastic chemotherapy: Secondary | ICD-10-CM | POA: Insufficient documentation

## 2017-07-16 DIAGNOSIS — C50411 Malignant neoplasm of upper-outer quadrant of right female breast: Secondary | ICD-10-CM

## 2017-07-16 DIAGNOSIS — K219 Gastro-esophageal reflux disease without esophagitis: Secondary | ICD-10-CM | POA: Insufficient documentation

## 2017-07-16 DIAGNOSIS — Z7689 Persons encountering health services in other specified circumstances: Secondary | ICD-10-CM | POA: Insufficient documentation

## 2017-07-16 DIAGNOSIS — J45909 Unspecified asthma, uncomplicated: Secondary | ICD-10-CM | POA: Diagnosis not present

## 2017-07-16 DIAGNOSIS — Z79899 Other long term (current) drug therapy: Secondary | ICD-10-CM | POA: Insufficient documentation

## 2017-07-16 LAB — COMPREHENSIVE METABOLIC PANEL
ALBUMIN: 4.2 g/dL (ref 3.5–5.0)
ALT: 14 U/L (ref 14–54)
ANION GAP: 10 (ref 5–15)
AST: 17 U/L (ref 15–41)
Alkaline Phosphatase: 62 U/L (ref 38–126)
BUN: 12 mg/dL (ref 6–20)
CHLORIDE: 102 mmol/L (ref 101–111)
CO2: 26 mmol/L (ref 22–32)
Calcium: 9.1 mg/dL (ref 8.9–10.3)
Creatinine, Ser: 0.95 mg/dL (ref 0.44–1.00)
GFR calc Af Amer: >60 mL/min (ref >60)
Glucose, Bld: 115 mg/dL — ABNORMAL HIGH (ref 65–99)
Potassium: 4.3 mmol/L (ref 3.5–5.1)
Sodium: 138 mmol/L (ref 135–145)
Total Bilirubin: 0.1 mg/dL — ABNORMAL LOW (ref 0.3–1.2)
Total Protein: 7.7 g/dL (ref 6.5–8.1)

## 2017-07-16 LAB — CBC WITH DIFFERENTIAL/PLATELET
BASOS ABS: 0.2 10*3/uL — AB (ref 0–0.1)
Basophils Relative: 2 %
EOS PCT: 1 %
Eosinophils Absolute: 0.1 10*3/uL (ref 0–0.7)
HCT: 42.5 % (ref 35.0–47.0)
HEMOGLOBIN: 14.3 g/dL (ref 12.0–16.0)
LYMPHS ABS: 2.7 10*3/uL (ref 1.0–3.6)
LYMPHS PCT: 22 %
MCH: 29.2 pg (ref 26.0–34.0)
MCHC: 33.7 g/dL (ref 32.0–36.0)
MCV: 86.5 fL (ref 80.0–100.0)
Monocytes Absolute: 0.6 10*3/uL (ref 0.2–0.9)
Monocytes Relative: 5 %
NEUTROS ABS: 8.8 10*3/uL — AB (ref 1.4–6.5)
Neutrophils Relative %: 70 %
Platelets: 382 10*3/uL (ref 150–440)
RBC: 4.91 MIL/uL (ref 3.80–5.20)
RDW: 11.7 % (ref 11.5–14.5)
WBC: 12.4 10*3/uL — AB (ref 3.6–11.0)

## 2017-07-16 MED ORDER — LIDOCAINE-PRILOCAINE 2.5-2.5 % EX CREA: TOPICAL_CREAM | CUTANEOUS | 3 refills | Status: DC

## 2017-07-16 MED ORDER — ONDANSETRON HCL 8 MG PO TABS: 8.0000 mg | ORAL_TABLET | Freq: Two times a day (BID) | ORAL | 1 refills | Status: DC | PRN

## 2017-07-16 MED ORDER — PROCHLORPERAZINE MALEATE 10 MG PO TABS: 10.0000 mg | ORAL_TABLET | Freq: Four times a day (QID) | ORAL | 1 refills | Status: DC | PRN

## 2017-07-16 NOTE — Addendum Note (Signed)
Addended by: Magdalene Patricia B on: 07/16/2017 02:18 PM   Modules accepted: Orders

## 2017-07-16 NOTE — Progress Notes (Signed)
Here for follow up

## 2017-07-17 ENCOUNTER — Ambulatory Visit
Admission: RE | Admit: 2017-07-17 | Discharge: 2017-07-17 | Disposition: A | Discharge status: Home / Self Care | Payer: 59 | Source: Ambulatory Visit | Attending: Oncology | Admitting: Oncology

## 2017-07-17 ENCOUNTER — Encounter: Payer: Self-pay | Admitting: Oncology

## 2017-07-17 DIAGNOSIS — Z170 Estrogen receptor positive status [ER+]: Secondary | ICD-10-CM | POA: Diagnosis present

## 2017-07-17 DIAGNOSIS — C50411 Malignant neoplasm of upper-outer quadrant of right female breast: Secondary | ICD-10-CM | POA: Insufficient documentation

## 2017-07-17 NOTE — Progress Notes (Signed)
Hematology/Oncology Follow up note [facility] Telephone:(336) [phone] Fax:(336) [phone]   Patient Care Team: [name], NP as PCP - General (Nurse Practitioner)  REFERRING PROVIDER: [name], NP CHIEF COMPLAINTS/PURPOSE OF CONSULTATION:  Follow-up for chemotherapy management of breast cancer   HISTORY OF PRESENTING ILLNESS:  Anne Wu is a  37 y.o.  female with PMH listed below who was referred to me for evaluation of newly diagnosed breast cancer.  She has significant family history on both maternal side as well as paternal side. She felt a right breast mass 2 months ago and diagnostic mammogram showed right upper quandrant 2.2 cm mass, which was confirmed on [place] and biopsied. Right axillary no clinically suspicious lymph node. Pathology showed invasive mammary carcinoma, ER/PR positive,, HER2 IHC  equivocal, FISH negative.   # Patient was evaluated by Dr.Smith. From surgical standpoint, Dr.Smith recommend neoadjuvant chemotherapy to facilitate surgery to obtain negative margin. Also given patient's young age, neoadjuvant chemotherapy is reasonable.    #Patient stated that she does not desire future fertility.  # 2D Echo showed normal systolic function, mild mitral regurgitation.   INTERVAL HISTORY Anne Wu is a 37 y.o. female who has above history reviewed by me today presents for prior to cycle 1 neoadjuvant chemotherapy for treatment of breast cancer (cT2cN0cM0) She has no new compliant today except feeling little nervous. She has been to chemotherapy class. She has obtained anti-emetics and understands the medication instructions.  She tells me that she is not interested in [place] system and would like to proceed with chemotherapy today.    Review of Systems  Constitutional: Negative for chills, fever, malaise/fatigue and weight loss.  HENT: Negative for hearing loss.   Eyes: Negative for blurred vision and double vision.    Respiratory: Negative for cough.   Cardiovascular: Negative for chest pain.  Gastrointestinal: Negative for heartburn and nausea.  Genitourinary: Negative for dysuria.  Musculoskeletal: Negative for myalgias.  Skin: Negative for rash.  Neurological: Negative for dizziness and tingling.  Endo/Heme/Allergies: Does not bruise/bleed easily.  Psychiatric/Behavioral: Negative for depression and suicidal ideas.    MEDICAL HISTORY:  Past Medical History:  Diagnosis Date  . Asthma    AS A CHILD-NO INHALERS  . Breast cancer ([place])   . Elevated blood pressure, situational    PT STATES HER LAST COUPLE OF MD APPOINTMENTS SHE HAS HAD ELEVATED BP-NEVER HAD A PROBLEM WITH THIS PREVIOUSLY  . GERD (gastroesophageal reflux disease)    OCC- NO MEDS    SURGICAL HISTORY: Past Surgical History:  Procedure Laterality Date  . NO PAST SURGERIES    . PORTACATH PLACEMENT N/A 07/11/2017   Procedure: INSERTION PORT-A-CATH;  Surgeon: Leonie Green, MD;  Location: [facility] ORS;  Service: General;  Laterality: N/A;    SOCIAL HISTORY: Social History   Socioeconomic History  . Marital status: Married    Spouse name: Not on file  . Number of children: Not on file  . Years of education: Not on file  . Highest education level: Not on file  Social Needs  . Financial resource strain: Not on file  . Food insecurity - worry: Not on file  . Food insecurity - inability: Not on file  . Transportation needs - medical: Not on file  . Transportation needs - non-medical: Not on file  Occupational History  . Not on file  Tobacco Use  . Smoking status: Never Smoker  . Smokeless tobacco: Never Used  Substance and Sexual Activity  . Alcohol  use: No    Alcohol/week: 0.0 oz  . Drug use: No  . Sexual activity: Yes  Other Topics Concern  . Not on file  Social History Narrative  . Not on file    FAMILY HISTORY: Family History  Problem Relation Age of Onset  . Breast cancer Maternal Aunt   . Breast cancer  Maternal Grandmother   . Breast cancer Cousin   . Cancer Maternal Uncle   . Breast cancer Paternal Aunt   . Breast cancer Paternal Grandmother     ALLERGIES:  is allergic to codeine.  MEDICATIONS:  Current Outpatient Medications  Medication Sig Dispense Refill  . acetaminophen (TYLENOL) 325 MG tablet Take 650 mg by mouth every 6 (six) hours as needed (for pain.).     Marland Kitchen HYDROcodone-acetaminophen (NORCO) 5-325 MG tablet Take 1-2 tablets by mouth every 6 (six) hours as needed for moderate pain. (Patient not taking: Reported on 07/16/2017) 6 tablet 0  . ibuprofen (ADVIL,MOTRIN) 200 MG tablet Take 400 mg by mouth every 8 (eight) hours as needed (for pain.).    Marland Kitchen lidocaine-prilocaine (EMLA) cream Apply to affected area once (Patient not taking: Reported on 07/16/2017) 30 g 3  . Multiple Vitamin (MULTI-VITAMINS) TABS Take by mouth.    . Multiple Vitamin (MULTIVITAMIN WITH MINERALS) TABS tablet Take 1 tablet by mouth daily.    . ondansetron (ZOFRAN) 8 MG tablet Take 1 tablet (8 mg total) by mouth 2 (two) times daily as needed. Start on the third day after chemotherapy. (Patient not taking: Reported on 07/16/2017) 30 tablet 1  . prochlorperazine (COMPAZINE) 10 MG tablet Take 1 tablet (10 mg total) by mouth every 6 (six) hours as needed (Nausea or vomiting). (Patient not taking: Reported on 07/16/2017) 30 tablet 1   No current facility-administered medications for this visit.      PHYSICAL EXAMINATION: ECOG PERFORMANCE STATUS: 0 - Asymptomatic Vitals:   07/18/17 0857  BP: 132/82  Pulse: 86  Resp: 12  Temp: 98.8 F (37.1 C)   There were no vitals filed for this visit.  Physical Exam  Constitutional: She is oriented to person, place, and time and well-developed, well-nourished, and in no distress. No distress.  HENT:  Head: Normocephalic and atraumatic.  Mouth/Throat: No oropharyngeal exudate.  Eyes: EOM are normal. Pupils are equal, round, and reactive to light. Left eye exhibits no discharge.  No scleral icterus.  Neck: Normal range of motion. Neck supple. No JVD present.  Cardiovascular: Normal rate, regular rhythm and normal heart sounds. Exam reveals no friction rub.  No murmur heard. Pulmonary/Chest: Effort normal and breath sounds normal. No respiratory distress. She has no wheezes. She has no rales. She exhibits no tenderness.  Abdominal: Soft. Bowel sounds are normal. She exhibits no distension and no mass. There is no tenderness. There is no rebound.  Musculoskeletal: Normal range of motion. She exhibits no edema, tenderness or deformity.  Lymphadenopathy:    She has no cervical adenopathy.  Neurological: She is alert and oriented to person, place, and time. No cranial nerve deficit.  Skin: Skin is warm and dry. No rash noted. No erythema.  Psychiatric: Mood, affect and judgment normal.  Breast exam was performed in seated and lying down position. No nipple discharge. Right breast 10'oclock palpable mobile 2 cm mass. No evidence of right axillary adenopathy No evidence of any palpable mass on left breast. No evidence of left axillary adenopathy.    Lab Results  Component Value Date   WBC 12.4 (H) 07/16/2017  HGB 14.3 07/16/2017   HCT 42.5 07/16/2017   MCV 86.5 07/16/2017   PLT 382 07/16/2017   Recent Labs    07/16/17 1620  NA 138  K 4.3  CL 102  CO2 26  GLUCOSE 115*  BUN 12  CREATININE 0.95  CALCIUM 9.1  GFRNONAA >60  GFRAA >60  PROT 7.7  ALBUMIN 4.2  AST 17  ALT 14  ALKPHOS 62  BILITOT 0.1*    2D Echo was done which showed LVEF 55-65%, mild mitral valve regurgitation.    ASSESSMENT & PLAN:  1. Malignant neoplasm of upper-outer quadrant of right breast in female, estrogen receptor positive ([place])   2. Encounter for antineoplastic chemotherapy    cT2N0M0, stage IIA disease, grade 2 # Ok to proceed cycle 1 neoadjuvant ddAC with onpro.   I explained to the patient the risks and benefits of chemotherapy including all but not limited to hair loss,  mouth sore, nausea, vomiting, low blood counts, bleeding, heart failure, neuropathy and risk of life threatening infection and even death, secondary malignancy etc. Patient is made aware of cold cap system and will update me if she is interested.   ## CBC, CMP, urine HCG prior to each cycle.  # I have referred her for genetic counseling and testing. If she carries BRCA mutation, will discuss mastectomy.  #  Discussed with patient and advice her to see gyn physician to discuss about non hormone contraceptive options.   All questions were answered. The patient knows to call the clinic with any problems questions or concerns.  Return visit 1 week to evaluate toxicity.   [name], MD, PhD Hematology Oncology [facility] at [facility] Pager- [phone] 07/18/2017

## 2017-07-17 NOTE — Progress Notes (Signed)
*  PRELIMINARY RESULTS* Echocardiogram 2D Echocardiogram has been performed.  Anne Wu 07/17/2017, 10:52 AM

## 2017-07-18 ENCOUNTER — Inpatient Hospital Stay (HOSPITAL_BASED_OUTPATIENT_CLINIC_OR_DEPARTMENT_OTHER): Payer: 59 | Admitting: Oncology

## 2017-07-18 ENCOUNTER — Inpatient Hospital Stay: Payer: 59

## 2017-07-18 ENCOUNTER — Other Ambulatory Visit: Payer: Self-pay

## 2017-07-18 ENCOUNTER — Encounter: Payer: Self-pay | Admitting: Oncology

## 2017-07-18 VITALS — BP 132/82 | HR 86 | Temp 98.8°F | Resp 12

## 2017-07-18 DIAGNOSIS — C50411 Malignant neoplasm of upper-outer quadrant of right female breast: Secondary | ICD-10-CM

## 2017-07-18 DIAGNOSIS — Z5111 Encounter for antineoplastic chemotherapy: Secondary | ICD-10-CM | POA: Diagnosis not present

## 2017-07-18 DIAGNOSIS — Z79899 Other long term (current) drug therapy: Secondary | ICD-10-CM

## 2017-07-18 DIAGNOSIS — Z170 Estrogen receptor positive status [ER+]: Secondary | ICD-10-CM | POA: Diagnosis not present

## 2017-07-18 LAB — CBC WITH DIFFERENTIAL/PLATELET
Basophils Absolute: 0.1 10*3/uL (ref 0–0.1)
Basophils Relative: 1 %
EOS ABS: 0.1 10*3/uL (ref 0–0.7)
Eosinophils Relative: 1 %
HEMATOCRIT: 39.6 % (ref 35.0–47.0)
HEMOGLOBIN: 13.5 g/dL (ref 12.0–16.0)
LYMPHS ABS: 2.2 10*3/uL (ref 1.0–3.6)
Lymphocytes Relative: 27 %
MCH: 29.7 pg (ref 26.0–34.0)
MCHC: 34.1 g/dL (ref 32.0–36.0)
MCV: 87.1 fL (ref 80.0–100.0)
MONO ABS: 0.5 10*3/uL (ref 0.2–0.9)
Monocytes Relative: 6 %
NEUTROS ABS: 5.2 10*3/uL (ref 1.4–6.5)
NEUTROS PCT: 65 %
Platelets: 351 10*3/uL (ref 150–440)
RBC: 4.54 MIL/uL (ref 3.80–5.20)
RDW: 11.6 % (ref 11.5–14.5)
WBC: 8 10*3/uL (ref 3.6–11.0)

## 2017-07-18 LAB — COMPREHENSIVE METABOLIC PANEL
ALBUMIN: 4 g/dL (ref 3.5–5.0)
ALK PHOS: 50 U/L (ref 38–126)
ALT: 13 U/L — AB (ref 14–54)
AST: 18 U/L (ref 15–41)
Anion gap: 7 (ref 5–15)
BUN: 15 mg/dL (ref 6–20)
CALCIUM: 9.2 mg/dL (ref 8.9–10.3)
CHLORIDE: 103 mmol/L (ref 101–111)
CO2: 27 mmol/L (ref 22–32)
CREATININE: 0.57 mg/dL (ref 0.44–1.00)
GFR calc non Af Amer: >60 mL/min (ref >60)
GLUCOSE: 100 mg/dL — AB (ref 65–99)
Potassium: 4.3 mmol/L (ref 3.5–5.1)
SODIUM: 137 mmol/L (ref 135–145)
Total Bilirubin: 0.7 mg/dL (ref 0.3–1.2)
Total Protein: 7.3 g/dL (ref 6.5–8.1)

## 2017-07-18 LAB — PREGNANCY, URINE: PREG TEST UR: NEGATIVE

## 2017-07-18 MED ORDER — SODIUM CHLORIDE 0.9 % IV SOLN
600.0000 mg/m2 | Freq: Once | INTRAVENOUS | Status: AC
  Administered 2017-07-18: 1120 mg via INTRAVENOUS
  Filled 2017-07-18: qty 50

## 2017-07-18 MED ORDER — SODIUM CHLORIDE 0.9 % IV SOLN
Freq: Once | INTRAVENOUS | Status: AC
  Administered 2017-07-18: 10:00:00 via INTRAVENOUS
  Filled 2017-07-18: qty 5

## 2017-07-18 MED ORDER — CHLORHEXIDINE GLUCONATE 0.12 % MT SOLN: 15.0000 mL | Freq: Two times a day (BID) | OROMUCOSAL | 0 refills | Status: DC

## 2017-07-18 MED ORDER — PEGFILGRASTIM 6 MG/0.6ML ~~LOC~~ PSKT
6.0000 mg | PREFILLED_SYRINGE | Freq: Once | SUBCUTANEOUS | Status: AC
  Administered 2017-07-18: 6 mg via SUBCUTANEOUS
  Filled 2017-07-18: qty 0.6

## 2017-07-18 MED ORDER — HEPARIN SOD (PORK) LOCK FLUSH 100 UNIT/ML IV SOLN
500.0000 [IU] | Freq: Once | INTRAVENOUS | Status: AC | PRN
  Administered 2017-07-18: 500 [IU]
  Filled 2017-07-18: qty 5

## 2017-07-18 MED ORDER — SODIUM CHLORIDE 0.9 % IV SOLN
Freq: Once | INTRAVENOUS | Status: AC
  Administered 2017-07-18: 10:00:00 via INTRAVENOUS
  Filled 2017-07-18: qty 1000

## 2017-07-18 MED ORDER — DOXORUBICIN HCL CHEMO IV INJECTION 2 MG/ML
60.0000 mg/m2 | Freq: Once | INTRAVENOUS | Status: AC
  Administered 2017-07-18: 112 mg via INTRAVENOUS
  Filled 2017-07-18: qty 56

## 2017-07-18 MED ORDER — PALONOSETRON HCL INJECTION 0.25 MG/5ML
0.2500 mg | Freq: Once | INTRAVENOUS | Status: AC
  Administered 2017-07-18: 0.25 mg via INTRAVENOUS
  Filled 2017-07-18: qty 5

## 2017-07-18 NOTE — Progress Notes (Signed)
Patient here for first chemo treatment.

## 2017-07-21 ENCOUNTER — Telehealth: Payer: Self-pay | Admitting: Genetic Counselor

## 2017-07-21 ENCOUNTER — Encounter: Payer: Self-pay | Admitting: Genetic Counselor

## 2017-07-21 NOTE — Telephone Encounter (Signed)
Anne Wu was referred for genetic counseling by Dr. Tasia Catchings due to a personal and family history of breast cancer. I spoke with her today to schedule this telegenetics visit to be done by phone at her convenience and she opted to schedule for later this afternoon, 07/21/17 at [place], [place], [facility] Genetic Counselor Phone: [phone]

## 2017-07-21 NOTE — Telephone Encounter (Signed)
Cancer Genetics            Telegenetics Initial Visit    Patient Name: Anne Wu Patient DOB: 1981/07/14 Patient Age: 37 y.o. Phone Call Date: 07/21/2017  Referring Provider: [name], MD  Reason for Visit: Evaluate for hereditary susceptibility to cancer    Assessment and Plan:  . Anne Wu' history is suggestive of a hereditary predisposition to cancer given her young age at diagnosis in combination with her family history.   . Testing is recommended to determine whether she has a pathogenic mutation that will impact her decision for surgery once neoadjuvant chemotherapy is completed as well as her screening and risk-reduction for future cancers.   . Anne Wu did not wish to pursue genetic testing today. Testing is highly recommend and can be coordinated at any time in the future if she changes her mind. We discussed that we can also confirm her insurance coverage and potential out-of-pocket costs now, but she declined.  . Anne Wu is encouraged to remain in contact with Cancer Genetics annually so that we can update the family history and inform her of any changes in cancer genetics and testing that may be of benefit for this family.   Dr. Grayland Ormond was available for questions concerning this case. Total time spent by counseling by phone was approximately 20 minutes.   _____________________________________________________________________   History of Present Illness: Ms. Anne Wu, a 37 y.o. female, was referred for genetic counseling to discuss the possibility of a hereditary predisposition to cancer and discuss whether genetic testing is warranted. This was a telegenetics visit via phone.  Anne Wu was diagnosed with breast cancer at the age of 1. She is currently receiving neoadjuvant chemotherapy.   Past Medical History:  Diagnosis Date  . Asthma    AS A CHILD-NO INHALERS  . Breast cancer ([place])   . Elevated blood pressure, situational    PT  STATES HER LAST COUPLE OF MD APPOINTMENTS SHE HAS HAD ELEVATED BP-NEVER HAD A PROBLEM WITH THIS PREVIOUSLY  . GERD (gastroesophageal reflux disease)    OCC- NO MEDS    Past Surgical History:  Procedure Laterality Date  . NO PAST SURGERIES    . PORTACATH PLACEMENT N/A 07/11/2017   Procedure: INSERTION PORT-A-CATH;  Surgeon: Leonie Green, MD;  Location: [facility] ORS;  Service: General;  Laterality: N/A;    Family History: Significant diagnoses include the following:  Family History  Problem Relation Age of Onset  . Breast cancer Maternal Aunt 38       currently late 70s  . Breast cancer Maternal Grandmother 68       second breast vs. recurrence ca at 8; deceased at 44  . Colon cancer Maternal Uncle 55       deceased in 45s  . Breast cancer Paternal Aunt        age at dx unknown; currently in 84s  . Breast cancer Paternal Grandmother        age at dx unknown  . Melanoma Mother        on leg  . Testicular cancer Maternal Uncle 26       currently 69s    Additionally, Anne Wu has no children. She has one brother (age 31) who has a son. Her mother (age 31s) has 2 sisters and 2 brothers. Her father (age 40s) has a sister and 2 brothers.  There is no known Jewish ancestry and  no consanguinity.  Discussion: We reviewed the characteristics, features and inheritance patterns of hereditary cancer syndromes. We discussed her risk of harboring a mutation in the context of her personal and family history. We discussed the process of genetic testing, insurance coverage and implications of results: positive, negative and variant of unknown significance (VUS).   [name] questions were answered to her satisfaction today and she is welcome to call with any additional questions or concerns. Thank you for the referral and allowing [place] to share in the care of your patient.    Steele Berg, MS, [place] Certified Genetic Counselor phone: [phone]

## 2017-07-24 NOTE — Progress Notes (Signed)
Hematology/Oncology Follow up note [facility] Telephone:([phone] Fax:(336) [phone]   Patient Care Team: [name], NP as PCP - General (Nurse Practitioner)  REFERRING PROVIDER: [name], NP CHIEF COMPLAINTS/PURPOSE OF CONSULTATION:  Follow-up for chemotherapy management of breast cancer   HISTORY OF PRESENTING ILLNESS:  Anne Wu is a  37 y.o.  female with PMH listed below who was referred to me for evaluation of newly diagnosed breast cancer.  She has significant family history on both maternal side as well as paternal side. She felt a right breast mass 2 months ago and diagnostic mammogram showed right upper quandrant 2.2 cm mass, which was confirmed on [place] and biopsied. Right axillary no clinically suspicious lymph node. Pathology showed invasive mammary carcinoma, ER/PR positive,, HER2 IHC  equivocal, FISH negative.   # Patient was evaluated by Dr.Smith. From surgical standpoint, Dr.Smith recommend neoadjuvant chemotherapy to facilitate surgery to obtain negative margin. Also given patient's young age, neoadjuvant chemotherapy is reasonable.    #Patient stated that she does not desire future fertility.  # 2D Echo showed normal systolic function, mild mitral regurgitation.   INTERVAL HISTORY Anne Wu is a 37 y.o. female who has above history reviewed by me today presents for evaluation after cycle 1 neoadjuvant chemotherapy for treatment of breast cancer (cT2cN0cM0). Has no complaints today. She had mild nausea after chemotherapy, relieved by antiemetics.     Review of Systems  Constitutional: Negative for chills, fever, malaise/fatigue and weight loss.  HENT: Negative for hearing loss.   Eyes: Negative for blurred vision, double vision and pain.  Respiratory: Negative for cough.   Cardiovascular: Negative for chest pain and orthopnea.  Gastrointestinal: Negative for abdominal pain, heartburn and nausea.  Genitourinary:  Negative for dysuria and frequency.  Musculoskeletal: Negative for back pain and myalgias.  Skin: Negative for rash.  Neurological: Negative for dizziness, tingling and tremors.  Endo/Heme/Allergies: Does not bruise/bleed easily.  Psychiatric/Behavioral: Negative for depression, substance abuse and suicidal ideas.    MEDICAL HISTORY:  Past Medical History:  Diagnosis Date  . Asthma    AS A CHILD-NO INHALERS  . Breast cancer ([place])   . Elevated blood pressure, situational    PT STATES HER LAST COUPLE OF MD APPOINTMENTS SHE HAS HAD ELEVATED BP-NEVER HAD A PROBLEM WITH THIS PREVIOUSLY  . GERD (gastroesophageal reflux disease)    OCC- NO MEDS    SURGICAL HISTORY: Past Surgical History:  Procedure Laterality Date  . NO PAST SURGERIES    . PORTACATH PLACEMENT N/A 07/11/2017   Procedure: INSERTION PORT-A-CATH;  Surgeon: Leonie Green, MD;  Location: [facility] ORS;  Service: General;  Laterality: N/A;    SOCIAL HISTORY: Social History   Socioeconomic History  . Marital status: Married    Spouse name: Not on file  . Number of children: Not on file  . Years of education: Not on file  . Highest education level: Not on file  Social Needs  . Financial resource strain: Not on file  . Food insecurity - worry: Not on file  . Food insecurity - inability: Not on file  . Transportation needs - medical: Not on file  . Transportation needs - non-medical: Not on file  Occupational History  . Not on file  Tobacco Use  . Smoking status: Never Smoker  . Smokeless tobacco: Never Used  Substance and Sexual Activity  . Alcohol use: No    Alcohol/week: 0.0 oz  . Drug use: No  . Sexual activity: Yes  Other Topics Concern  . Not on file  Social History Narrative  . Not on file    FAMILY HISTORY: Family History  Problem Relation Age of Onset  . Breast cancer Maternal Aunt 93       currently late 66s  . Breast cancer Maternal Grandmother 68       second breast vs. recurrence ca at  76; deceased at 79  . Colon cancer Maternal Uncle 41       deceased in 26s  . Breast cancer Paternal Aunt        age at dx unknown; currently in 73s  . Breast cancer Paternal Grandmother        age at dx unknown  . Melanoma Mother        on leg  . Testicular cancer Maternal Uncle 44       currently 52s    ALLERGIES:  is allergic to codeine.  MEDICATIONS:  Current Outpatient Medications  Medication Sig Dispense Refill  . acetaminophen (TYLENOL) 325 MG tablet Take 650 mg by mouth every 6 (six) hours as needed (for pain.).     Marland Kitchen chlorhexidine (PERIDEX) 0.12 % solution Use as directed 15 mLs in the mouth or throat 2 (two) times daily. 473 mL 0  . HYDROcodone-acetaminophen (NORCO) 5-325 MG tablet Take 1-2 tablets by mouth every 6 (six) hours as needed for moderate pain. 6 tablet 0  . ibuprofen (ADVIL,MOTRIN) 200 MG tablet Take 400 mg by mouth every 8 (eight) hours as needed (for pain.).    Marland Kitchen lidocaine-prilocaine (EMLA) cream Apply to affected area once 30 g 3  . Multiple Vitamin (MULTI-VITAMINS) TABS Take by mouth.    . Multiple Vitamin (MULTIVITAMIN WITH MINERALS) TABS tablet Take 1 tablet by mouth daily.    . ondansetron (ZOFRAN) 8 MG tablet Take 1 tablet (8 mg total) by mouth 2 (two) times daily as needed. Start on the third day after chemotherapy. 30 tablet 1  . prochlorperazine (COMPAZINE) 10 MG tablet Take 1 tablet (10 mg total) by mouth every 6 (six) hours as needed (Nausea or vomiting). 30 tablet 1   No current facility-administered medications for this visit.      PHYSICAL EXAMINATION: ECOG PERFORMANCE STATUS: 0 - Asymptomatic There were no vitals filed for this visit. There were no vitals filed for this visit.  Physical Exam  Constitutional: She is oriented to person, place, and time and well-developed, well-nourished, and in no distress. No distress.  HENT:  Head: Normocephalic and atraumatic.  Eyes: EOM are normal. Pupils are equal, round, and reactive to light. Right  eye exhibits no discharge. Left eye exhibits no discharge.  Neck: Normal range of motion. Neck supple. No JVD present. No thyromegaly present.  Cardiovascular: Normal rate, regular rhythm and normal heart sounds. Exam reveals no gallop and no friction rub.  No murmur heard. Pulmonary/Chest: Effort normal and breath sounds normal. No respiratory distress. She has no wheezes. She has no rales. She exhibits no tenderness.  Abdominal: Soft. Bowel sounds are normal. She exhibits no distension and no mass. There is no tenderness. There is no rebound.  Musculoskeletal: Normal range of motion. She exhibits no edema, tenderness or deformity.  Neurological: She is alert and oriented to person, place, and time. She displays normal reflexes. No cranial nerve deficit.  Skin: Skin is warm and dry. No rash noted. No erythema. No pallor.  Psychiatric: Mood, affect and judgment normal.  Breast exam was performed in seated and lying down position. No  nipple discharge. Right breast 10'oclock palpable mobile 2 cm mass. No evidence of right axillary adenopathy No evidence of any palpable mass on left breast. No evidence of left axillary adenopathy.    Lab Results  Component Value Date   WBC 8.0 07/18/2017   HGB 13.5 07/18/2017   HCT 39.6 07/18/2017   MCV 87.1 07/18/2017   PLT 351 07/18/2017   Recent Labs    07/16/17 1620 07/18/17 0837  NA 138 137  K 4.3 4.3  CL 102 103  CO2 26 27  GLUCOSE 115* 100*  BUN 12 15  CREATININE 0.95 0.57  CALCIUM 9.1 9.2  GFRNONAA >60 >60  GFRAA >60 >60  PROT 7.7 7.3  ALBUMIN 4.2 4.0  AST 17 18  ALT 14 13*  ALKPHOS 62 50  BILITOT 0.1* 0.7    2D Echo was done which showed LVEF 55-65%, mild mitral valve regurgitation.    ASSESSMENT & PLAN:  1. Malignant neoplasm of upper-outer quadrant of right breast in female, estrogen receptor positive ([place])    cT2N0M0, stage IIA disease, grade 2  Tolerates cycle 1 neoadjuvant ddAC with onpro. Counts stable.   ## CBC, CMP,  urine HCG prior to each cycle.  # referred her for genetic counseling and testing. If she carries BRCA mutation, will discuss mastectomy. She declined.     Will discuss with her again.   #  Discussed with patient and advice her to see gyn physician to discuss about non hormone contraceptive options.   All questions were answered. The patient knows to call the clinic with any problems questions or concerns.  Return visit 1 week prior to cycle 2 ddAC  [name], MD, PhD Hematology Oncology [facility] Pager- [phone] 07/25/2017

## 2017-07-25 ENCOUNTER — Other Ambulatory Visit: Payer: Self-pay

## 2017-07-25 ENCOUNTER — Other Ambulatory Visit: Payer: Self-pay | Admitting: *Deleted

## 2017-07-25 ENCOUNTER — Inpatient Hospital Stay (HOSPITAL_BASED_OUTPATIENT_CLINIC_OR_DEPARTMENT_OTHER): Payer: 59 | Admitting: Oncology

## 2017-07-25 ENCOUNTER — Inpatient Hospital Stay: Payer: 59

## 2017-07-25 ENCOUNTER — Encounter: Payer: Self-pay | Admitting: Oncology

## 2017-07-25 VITALS — BP 139/83 | HR 101 | Temp 98.7°F | Wt 185.2 lb

## 2017-07-25 DIAGNOSIS — Z79899 Other long term (current) drug therapy: Secondary | ICD-10-CM | POA: Diagnosis not present

## 2017-07-25 DIAGNOSIS — Z170 Estrogen receptor positive status [ER+]: Secondary | ICD-10-CM | POA: Diagnosis not present

## 2017-07-25 DIAGNOSIS — Z5111 Encounter for antineoplastic chemotherapy: Secondary | ICD-10-CM | POA: Diagnosis not present

## 2017-07-25 DIAGNOSIS — C50411 Malignant neoplasm of upper-outer quadrant of right female breast: Secondary | ICD-10-CM | POA: Diagnosis not present

## 2017-07-25 DIAGNOSIS — C50919 Malignant neoplasm of unspecified site of unspecified female breast: Secondary | ICD-10-CM

## 2017-07-25 LAB — CBC WITH DIFFERENTIAL/PLATELET
BASOS PCT: 1 %
Basophils Absolute: 0 10*3/uL (ref 0–0.1)
EOS ABS: 0.2 10*3/uL (ref 0–0.7)
Eosinophils Relative: 3 %
HCT: 39.9 % (ref 35.0–47.0)
Hemoglobin: 13.3 g/dL (ref 12.0–16.0)
Lymphocytes Relative: 24 %
Lymphs Abs: 1.4 10*3/uL (ref 1.0–3.6)
MCH: 29.1 pg (ref 26.0–34.0)
MCHC: 33.3 g/dL (ref 32.0–36.0)
MCV: 87.6 fL (ref 80.0–100.0)
Monocytes Absolute: 0.3 10*3/uL (ref 0.2–0.9)
Monocytes Relative: 5 %
NEUTROS PCT: 67 %
Neutro Abs: 3.8 10*3/uL (ref 1.4–6.5)
PLATELETS: 237 10*3/uL (ref 150–440)
RBC: 4.56 MIL/uL (ref 3.80–5.20)
RDW: 11.7 % (ref 11.5–14.5)
WBC: 5.8 10*3/uL (ref 3.6–11.0)

## 2017-07-25 LAB — COMPREHENSIVE METABOLIC PANEL
ALT: 11 U/L — AB (ref 14–54)
ANION GAP: 8 (ref 5–15)
AST: 18 U/L (ref 15–41)
Albumin: 4.1 g/dL (ref 3.5–5.0)
Alkaline Phosphatase: 92 U/L (ref 38–126)
BUN: 13 mg/dL (ref 6–20)
CHLORIDE: 101 mmol/L (ref 101–111)
CO2: 25 mmol/L (ref 22–32)
CREATININE: 0.67 mg/dL (ref 0.44–1.00)
Calcium: 8.9 mg/dL (ref 8.9–10.3)
Glucose, Bld: 97 mg/dL (ref 65–99)
Potassium: 4.3 mmol/L (ref 3.5–5.1)
Sodium: 134 mmol/L — ABNORMAL LOW (ref 135–145)
Total Bilirubin: 0.4 mg/dL (ref 0.3–1.2)
Total Protein: 7.3 g/dL (ref 6.5–8.1)

## 2017-07-25 NOTE — Progress Notes (Signed)
Patient here today for follow up.  Patient states no new concerns today  

## 2017-07-31 NOTE — Progress Notes (Signed)
Hematology/Oncology Follow up note [facility] Telephone:(336) [phone] Fax:(336) [phone]   Patient Care Team: [name], NP as PCP - General (Nurse Practitioner)  REFERRING PROVIDER: [name], NP CHIEF COMPLAINTS/PURPOSE OF CONSULTATION:  Follow-up for chemotherapy management of breast cancer   HISTORY OF PRESENTING ILLNESS:  Anne Wu is a  37 y.o.  female with PMH listed below who was referred to me for evaluation of newly diagnosed breast cancer.  She has significant family history on both maternal side as well as paternal side. She felt a right breast mass 2 months ago and diagnostic mammogram showed right upper quandrant 2.2 cm mass, which was confirmed on [place] and biopsied. Right axillary no clinically suspicious lymph node. Pathology showed invasive mammary carcinoma, ER/PR positive,, HER2 IHC  equivocal, FISH negative.   # Patient was evaluated by Dr.Smith. From surgical standpoint, Dr.Smith recommend neoadjuvant chemotherapy to facilitate surgery to obtain negative margin. Also given patient's young age, neoadjuvant chemotherapy is reasonable.    #Patient stated that she does not desire future fertility.  # 2D Echo showed normal systolic function, mild mitral regurgitation.   INTERVAL HISTORY Anne Wu is a 37 y.o. female who has above history reviewed by me today presents for evaluation prior to cycle 2 neoadjuvant chemotherapy for treatment of breast cancer (cT2cN0cM0). Has no complaints today. Patient reports feeling completely fine. Without any concerns. Denies fever or chills mouth sores or diarrhea.    Review of Systems  Constitutional: Negative for chills and fever.  HENT: Negative for congestion and hearing loss.   Eyes: Negative for blurred vision.  Respiratory: Negative for cough and hemoptysis.   Cardiovascular: Negative for chest pain, palpitations and orthopnea.  Gastrointestinal: Negative for abdominal pain, nausea  and vomiting.  Genitourinary: Negative for dysuria, frequency and urgency.  Musculoskeletal: Negative for back pain, myalgias and neck pain.  Skin: Negative for rash.  Neurological: Negative for dizziness, tingling, sensory change and weakness.  Endo/Heme/Allergies: Does not bruise/bleed easily.  Psychiatric/Behavioral: Negative for depression, hallucinations and substance abuse.    MEDICAL HISTORY:  Past Medical History:  Diagnosis Date  . Asthma    AS A CHILD-NO INHALERS  . Breast cancer ([place])   . Elevated blood pressure, situational    PT STATES HER LAST COUPLE OF MD APPOINTMENTS SHE HAS HAD ELEVATED BP-NEVER HAD A PROBLEM WITH THIS PREVIOUSLY  . GERD (gastroesophageal reflux disease)    OCC- NO MEDS    SURGICAL HISTORY: Past Surgical History:  Procedure Laterality Date  . NO PAST SURGERIES    . PORTACATH PLACEMENT N/A 07/11/2017   Procedure: INSERTION PORT-A-CATH;  Surgeon: Leonie Green, MD;  Location: [facility] ORS;  Service: General;  Laterality: N/A;    SOCIAL HISTORY: Social History   Socioeconomic History  . Marital status: Married    Spouse name: Not on file  . Number of children: Not on file  . Years of education: Not on file  . Highest education level: Not on file  Social Needs  . Financial resource strain: Not on file  . Food insecurity - worry: Not on file  . Food insecurity - inability: Not on file  . Transportation needs - medical: Not on file  . Transportation needs - non-medical: Not on file  Occupational History  . Not on file  Tobacco Use  . Smoking status: Never Smoker  . Smokeless tobacco: Never Used  Substance and Sexual Activity  . Alcohol use: No    Alcohol/week: 0.0 oz  .  Drug use: No  . Sexual activity: Yes  Other Topics Concern  . Not on file  Social History Narrative  . Not on file    FAMILY HISTORY: Family History  Problem Relation Age of Onset  . Breast cancer Maternal Aunt 59       currently late 32s  . Breast cancer  Maternal Grandmother 68       second breast vs. recurrence ca at 79; deceased at 18  . Colon cancer Maternal Uncle 64       deceased in 82s  . Breast cancer Paternal Aunt        age at dx unknown; currently in 84s  . Breast cancer Paternal Grandmother        age at dx unknown  . Melanoma Mother        on leg  . Testicular cancer Maternal Uncle 4       currently 6s    ALLERGIES:  is allergic to codeine.  MEDICATIONS:  Current Outpatient Medications  Medication Sig Dispense Refill  . acetaminophen (TYLENOL) 325 MG tablet Take 650 mg by mouth every 6 (six) hours as needed (for pain.).     Marland Kitchen chlorhexidine (PERIDEX) 0.12 % solution Use as directed 15 mLs in the mouth or throat 2 (two) times daily. 473 mL 0  . HYDROcodone-acetaminophen (NORCO) 5-325 MG tablet Take 1-2 tablets by mouth every 6 (six) hours as needed for moderate pain. 6 tablet 0  . ibuprofen (ADVIL,MOTRIN) 200 MG tablet Take 400 mg by mouth every 8 (eight) hours as needed (for pain.).    Marland Kitchen lidocaine-prilocaine (EMLA) cream Apply to affected area once 30 g 3  . loratadine (CLARITIN) 10 MG tablet Take 10 mg by mouth daily. One tablet 3 days after treatment    . Multiple Vitamin (MULTI-VITAMINS) TABS Take by mouth.    . Multiple Vitamin (MULTIVITAMIN WITH MINERALS) TABS tablet Take 1 tablet by mouth daily.    . ondansetron (ZOFRAN) 8 MG tablet Take 1 tablet (8 mg total) by mouth 2 (two) times daily as needed. Start on the third day after chemotherapy. 30 tablet 1  . prochlorperazine (COMPAZINE) 10 MG tablet Take 1 tablet (10 mg total) by mouth every 6 (six) hours as needed (Nausea or vomiting). 30 tablet 1   No current facility-administered medications for this visit.    Facility-Administered Medications Ordered in Other Visits  Medication Dose Route Frequency Provider Last Rate Last Dose  . heparin lock flush 100 unit/mL  500 Units Intravenous Once [name], MD      . heparin lock flush 100 unit/mL  500 Units Intracatheter  Once PRN [name], MD      . sodium chloride flush (NS) 0.9 % injection 10 mL  10 mL Intravenous PRN [name], MD   10 mL at 08/01/17 0829     PHYSICAL EXAMINATION: ECOG PERFORMANCE STATUS: 0 - Asymptomatic Vitals:   08/01/17 0850  BP: 132/83  Pulse: (!) 106  Temp: 98.4 F (36.9 C)   Filed Weights   08/01/17 0850  Weight: 189 lb 2 oz (85.8 kg)    Physical Exam  Constitutional: She is oriented to person, place, and time and well-developed, well-nourished, and in no distress. No distress.  HENT:  Head: Normocephalic and atraumatic.  Mouth/Throat: No oropharyngeal exudate.  Eyes: EOM are normal. Pupils are equal, round, and reactive to light. Right eye exhibits no discharge. No scleral icterus.  Neck: Normal range of motion. Neck supple. No JVD  present.  Cardiovascular: Normal rate, regular rhythm and normal heart sounds.  No murmur heard. Pulmonary/Chest: Effort normal and breath sounds normal. She has no wheezes.  Abdominal: Soft. Bowel sounds are normal. She exhibits no distension.  Musculoskeletal: Normal range of motion. She exhibits no edema.  Lymphadenopathy:    She has no cervical adenopathy.  Neurological: She is alert and oriented to person, place, and time. She displays normal reflexes. No cranial nerve deficit. Coordination normal.  Skin: Skin is warm and dry. No erythema.  Psychiatric: Affect normal.  Breast exam was performed in seated and lying down position. No nipple discharge. Right breast 10'oclock palpable mobile 2 cm mass. No evidence of right axillary adenopathy. No evidence of any palpable mass on left breast. No evidence of left axillary adenopathy.    Lab Results  Component Value Date   WBC 5.8 07/25/2017   HGB 13.3 07/25/2017   HCT 39.9 07/25/2017   MCV 87.6 07/25/2017   PLT 237 07/25/2017   Recent Labs    07/16/17 1620 07/18/17 0837 07/25/17 1338  NA 138 137 134*  K 4.3 4.3 4.3  CL 102 103 101  CO2 '26 27 25  ' GLUCOSE 115* 100* 97  BUN  '12 15 13  ' CREATININE 0.95 0.57 0.67  CALCIUM 9.1 9.2 8.9  GFRNONAA >60 >60 >60  GFRAA >60 >60 >60  PROT 7.7 7.3 7.3  ALBUMIN 4.2 4.0 4.1  AST '17 18 18  ' ALT 14 13* 11*  ALKPHOS 62 50 92  BILITOT 0.1* 0.7 0.4    2D Echo was done which showed LVEF 55-65%, mild mitral valve regurgitation.    ASSESSMENT & PLAN:  1. Malignant neoplasm of female breast, unspecified estrogen receptor status, unspecified laterality, unspecified site of breast ([place])   2. Malignant neoplasm of upper-outer quadrant of right breast in female, estrogen receptor positive ([place])   3. Encounter for antineoplastic chemotherapy    CT2N0M0, grade 2  Tolerates cycle 1 chemotherapy. Counts stable. Okay to proceed cycle 2 neoadjuvant ddAC with onpro.   CBC CMP prior to next cycle of chemotherapy. # referred her for genetic counseling and testing. Patient declined genetic testing. Discussed with patient about the benefit of having genetic testing as did the change her surgical management. If she carries BRCA mutation, Anne discuss mastectomy. Patient would like to think about it and update me.   #  Discussed with patient and advice her to see gyn physician to discuss about non hormone contraceptive options.  Today urine pregnancy test is negative. All questions were answered. The patient knows to call the clinic with any problems questions or concerns.  Return visit: 2 weeks prior to cycle 3 neoadjuvant ddAC  [name], MD, PhD Hematology [place] [facility] Pager- [phone] 08/01/2017

## 2017-08-01 ENCOUNTER — Inpatient Hospital Stay: Payer: 59

## 2017-08-01 ENCOUNTER — Other Ambulatory Visit: Payer: Self-pay

## 2017-08-01 ENCOUNTER — Encounter: Payer: Self-pay | Admitting: Oncology

## 2017-08-01 ENCOUNTER — Inpatient Hospital Stay (HOSPITAL_BASED_OUTPATIENT_CLINIC_OR_DEPARTMENT_OTHER): Payer: 59 | Admitting: Oncology

## 2017-08-01 VITALS — BP 132/83 | HR 106 | Temp 98.4°F | Wt 189.1 lb

## 2017-08-01 DIAGNOSIS — C50919 Malignant neoplasm of unspecified site of unspecified female breast: Secondary | ICD-10-CM

## 2017-08-01 DIAGNOSIS — Z79899 Other long term (current) drug therapy: Secondary | ICD-10-CM

## 2017-08-01 DIAGNOSIS — Z170 Estrogen receptor positive status [ER+]: Principal | ICD-10-CM

## 2017-08-01 DIAGNOSIS — Z5111 Encounter for antineoplastic chemotherapy: Secondary | ICD-10-CM | POA: Diagnosis not present

## 2017-08-01 DIAGNOSIS — C50411 Malignant neoplasm of upper-outer quadrant of right female breast: Secondary | ICD-10-CM

## 2017-08-01 LAB — CBC WITH DIFFERENTIAL/PLATELET
BASOS ABS: 0.1 10*3/uL (ref 0–0.1)
Basophils Relative: 1 %
Eosinophils Absolute: 0.1 10*3/uL (ref 0–0.7)
Eosinophils Relative: 1 %
HEMATOCRIT: 39.4 % (ref 35.0–47.0)
Hemoglobin: 13.4 g/dL (ref 12.0–16.0)
LYMPHS ABS: 2.6 10*3/uL (ref 1.0–3.6)
LYMPHS PCT: 26 %
MCH: 29.5 pg (ref 26.0–34.0)
MCHC: 34.1 g/dL (ref 32.0–36.0)
MCV: 86.5 fL (ref 80.0–100.0)
MONO ABS: 0.7 10*3/uL (ref 0.2–0.9)
MONOS PCT: 7 %
NEUTROS ABS: 6.5 10*3/uL (ref 1.4–6.5)
Neutrophils Relative %: 65 %
Platelets: 250 10*3/uL (ref 150–440)
RBC: 4.56 MIL/uL (ref 3.80–5.20)
RDW: 12 % (ref 11.5–14.5)
WBC: 10 10*3/uL (ref 3.6–11.0)

## 2017-08-01 LAB — COMPREHENSIVE METABOLIC PANEL
ALBUMIN: 4 g/dL (ref 3.5–5.0)
ALT: 18 U/L (ref 14–54)
AST: 23 U/L (ref 15–41)
Alkaline Phosphatase: 72 U/L (ref 38–126)
Anion gap: 6 (ref 5–15)
BUN: 12 mg/dL (ref 6–20)
CHLORIDE: 101 mmol/L (ref 101–111)
CO2: 29 mmol/L (ref 22–32)
Calcium: 9 mg/dL (ref 8.9–10.3)
Creatinine, Ser: 0.49 mg/dL (ref 0.44–1.00)
GFR calc Af Amer: >60 mL/min (ref >60)
GFR calc non Af Amer: >60 mL/min (ref >60)
GLUCOSE: 103 mg/dL — AB (ref 65–99)
POTASSIUM: 4 mmol/L (ref 3.5–5.1)
SODIUM: 136 mmol/L (ref 135–145)
TOTAL PROTEIN: 7.2 g/dL (ref 6.5–8.1)
Total Bilirubin: 0.3 mg/dL (ref 0.3–1.2)

## 2017-08-01 LAB — PREGNANCY, URINE: PREG TEST UR: NEGATIVE

## 2017-08-01 MED ORDER — FOSAPREPITANT DIMEGLUMINE INJECTION 150 MG
Freq: Once | INTRAVENOUS | Status: AC
  Administered 2017-08-01: 10:00:00 via INTRAVENOUS
  Filled 2017-08-01: qty 5

## 2017-08-01 MED ORDER — PALONOSETRON HCL INJECTION 0.25 MG/5ML
0.2500 mg | Freq: Once | INTRAVENOUS | Status: AC
  Administered 2017-08-01: 0.25 mg via INTRAVENOUS
  Filled 2017-08-01: qty 5

## 2017-08-01 MED ORDER — HEPARIN SOD (PORK) LOCK FLUSH 100 UNIT/ML IV SOLN
500.0000 [IU] | Freq: Once | INTRAVENOUS | Status: DC | PRN
  Filled 2017-08-01: qty 5

## 2017-08-01 MED ORDER — SODIUM CHLORIDE 0.9% FLUSH
10.0000 mL | INTRAVENOUS | Status: AC | PRN
  Administered 2017-08-01: 10 mL via INTRAVENOUS
  Filled 2017-08-01: qty 10

## 2017-08-01 MED ORDER — DOXORUBICIN HCL CHEMO IV INJECTION 2 MG/ML
60.0000 mg/m2 | Freq: Once | INTRAVENOUS | Status: AC
  Administered 2017-08-01: 112 mg via INTRAVENOUS
  Filled 2017-08-01: qty 56

## 2017-08-01 MED ORDER — HEPARIN SOD (PORK) LOCK FLUSH 100 UNIT/ML IV SOLN: 500.0000 [IU] | Freq: Once | INTRAVENOUS | Status: AC

## 2017-08-01 MED ORDER — PEGFILGRASTIM 6 MG/0.6ML ~~LOC~~ PSKT
6.0000 mg | PREFILLED_SYRINGE | Freq: Once | SUBCUTANEOUS | Status: AC
  Administered 2017-08-01: 6 mg via SUBCUTANEOUS

## 2017-08-01 MED ORDER — SODIUM CHLORIDE 0.9 % IV SOLN
Freq: Once | INTRAVENOUS | Status: AC
  Administered 2017-08-01: 10:00:00 via INTRAVENOUS
  Filled 2017-08-01: qty 1000

## 2017-08-01 MED ORDER — SODIUM CHLORIDE 0.9 % IV SOLN
600.0000 mg/m2 | Freq: Once | INTRAVENOUS | Status: AC
  Administered 2017-08-01: 1120 mg via INTRAVENOUS
  Filled 2017-08-01: qty 6

## 2017-08-14 NOTE — Progress Notes (Signed)
Hematology/Oncology Follow up note [facility] Telephone:([phone] Fax:([phone]   Patient Care Team: [name], NP as PCP - General (Nurse Practitioner)  REFERRING PROVIDER: [name], NP   REASON FOR VISIT on chemotherapy management of breast cancer   HISTORY OF PRESENTING ILLNESS:  Anne Wu is a  37 y.o.  female with diagnosis of cT2N0M0, grade 2 invasive mammary carcinoma.  She has significant family history on both maternal side as well as paternal side. She felt a right breast mass 2 months ago and diagnostic mammogram showed right upper quandrant 2.2 cm mass, which was confirmed on [place] and biopsied. Right axillary no clinically suspicious lymph node. Pathology showed invasive mammary carcinoma, ER/PR positive,, HER2 IHC  equivocal, FISH negative.   # Patient was evaluated by Dr.Smith. From surgical standpoint, Dr.Smith recommend neoadjuvant chemotherapy to facilitate surgery to obtain negative margin and better surgical outcome. Also given patient's young age, neoadjuvant chemotherapy is reasonable.    #Patient stated that she does not desire future fertility.  # 2D Echo showed normal systolic function, mild mitral regurgitation.   INTERVAL HISTORY Anne Wu is a 37 y.o. female who has above history reviewed by me today presents for evaluation prior to cycle 3  neoadjuvant chemotherapy for treatment of breast cancer (cT2cN0cM0).  Reports feeling fatigue.  Mild nausea relieved by antiemetics.. Denies fever or chills mouth sores or diarrhea.  Hair loss   Review of Systems  Constitutional: Negative for chills and fever.  HENT: Negative for congestion and hearing loss.   Respiratory: Negative for cough and hemoptysis.   Cardiovascular: Negative for chest pain, palpitations and orthopnea.  Gastrointestinal: Positive for nausea. Negative for abdominal pain and vomiting.  Musculoskeletal: Negative for back pain, myalgias and neck  pain.  Skin: Negative for rash.  Neurological: Negative for dizziness, tingling, sensory change and weakness.  Endo/Heme/Allergies: Does not bruise/bleed easily.  Psychiatric/Behavioral: Negative for depression.    MEDICAL HISTORY:  Past Medical History:  Diagnosis Date  . Asthma    AS A CHILD-NO INHALERS  . Breast cancer ([place])   . Elevated blood pressure, situational    PT STATES HER LAST COUPLE OF MD APPOINTMENTS SHE HAS HAD ELEVATED BP-NEVER HAD A PROBLEM WITH THIS PREVIOUSLY  . GERD (gastroesophageal reflux disease)    OCC- NO MEDS    SURGICAL HISTORY: Past Surgical History:  Procedure Laterality Date  . NO PAST SURGERIES    . PORTACATH PLACEMENT N/A 07/11/2017   Procedure: INSERTION PORT-A-CATH;  Surgeon: Leonie Green, MD;  Location: [facility] ORS;  Service: General;  Laterality: N/A;    SOCIAL HISTORY: Social History   Socioeconomic History  . Marital status: Married    Spouse name: Not on file  . Number of children: Not on file  . Years of education: Not on file  . Highest education level: Not on file  Social Needs  . Financial resource strain: Not on file  . Food insecurity - worry: Not on file  . Food insecurity - inability: Not on file  . Transportation needs - medical: Not on file  . Transportation needs - non-medical: Not on file  Occupational History  . Not on file  Tobacco Use  . Smoking status: Never Smoker  . Smokeless tobacco: Never Used  Substance and Sexual Activity  . Alcohol use: No    Alcohol/week: 0.0 oz  . Drug use: No  . Sexual activity: Yes  Other Topics Concern  . Not on file  Social  History Narrative  . Not on file    FAMILY HISTORY: Family History  Problem Relation Age of Onset  . Breast cancer Maternal Aunt 11       currently late 64s  . Breast cancer Maternal Grandmother 68       second breast vs. recurrence ca at 35; deceased at 58  . Colon cancer Maternal Uncle 33       deceased in 10s  . Breast cancer Paternal  Aunt        age at dx unknown; currently in 82s  . Breast cancer Paternal Grandmother        age at dx unknown  . Melanoma Mother        on leg  . Testicular cancer Maternal Uncle 71       currently 60s    ALLERGIES:  is allergic to codeine.  MEDICATIONS:  Current Outpatient Medications  Medication Sig Dispense Refill  . acetaminophen (TYLENOL) 325 MG tablet Take 650 mg by mouth every 6 (six) hours as needed (for pain.).     Marland Kitchen chlorhexidine (PERIDEX) 0.12 % solution Use as directed 15 mLs in the mouth or throat 2 (two) times daily. 473 mL 0  . HYDROcodone-acetaminophen (NORCO) 5-325 MG tablet Take 1-2 tablets by mouth every 6 (six) hours as needed for moderate pain. 6 tablet 0  . ibuprofen (ADVIL,MOTRIN) 200 MG tablet Take 400 mg by mouth every 8 (eight) hours as needed (for pain.).    Marland Kitchen lidocaine-prilocaine (EMLA) cream Apply to affected area once 30 g 3  . loratadine (CLARITIN) 10 MG tablet Take 10 mg by mouth daily. One tablet 3 days after treatment    . Multiple Vitamin (MULTI-VITAMINS) TABS Take by mouth.    . Multiple Vitamin (MULTIVITAMIN WITH MINERALS) TABS tablet Take 1 tablet by mouth daily.    . ondansetron (ZOFRAN) 8 MG tablet Take 1 tablet (8 mg total) by mouth 2 (two) times daily as needed. Start on the third day after chemotherapy. 30 tablet 1  . prochlorperazine (COMPAZINE) 10 MG tablet Take 1 tablet (10 mg total) by mouth every 6 (six) hours as needed (Nausea or vomiting). 30 tablet 1   No current facility-administered medications for this visit.    Facility-Administered Medications Ordered in Other Visits  Medication Dose Route Frequency Provider Last Rate Last Dose  . heparin lock flush 100 unit/mL  500 Units Intravenous Once [name], MD      . sodium chloride flush (NS) 0.9 % injection 10 mL  10 mL Intravenous PRN [name], MD   10 mL at 08/01/17 0829     PHYSICAL EXAMINATION: ECOG PERFORMANCE STATUS: 0 - Asymptomatic Vitals:   08/15/17 0838  BP: 139/85    Pulse: (!) 101  Temp: 98.8 F (37.1 C)   Filed Weights   08/15/17 0838  Weight: 188 lb 1 oz (85.3 kg)    Physical Exam  Constitutional: She is oriented to person, place, and time and well-developed, well-nourished, and in no distress. No distress.  HENT:  Head: Normocephalic and atraumatic.  Mouth/Throat: No oropharyngeal exudate.  Eyes: EOM are normal. Pupils are equal, round, and reactive to light. Right eye exhibits no discharge. No scleral icterus.  Neck: Normal range of motion. Neck supple. No JVD present.  Cardiovascular: Normal rate, regular rhythm and normal heart sounds.  No murmur heard. Pulmonary/Chest: Effort normal and breath sounds normal. She has no wheezes.  Abdominal: Soft. Bowel sounds are normal. She exhibits no distension.  Musculoskeletal: Normal range of motion. She exhibits no edema.  Lymphadenopathy:    She has no cervical adenopathy.  Neurological: She is alert and oriented to person, place, and time. No cranial nerve deficit.  Skin: Skin is warm and dry. No erythema.  Psychiatric: Affect and judgment normal.  Breast exam was performed in seated and lying down position. No nipple discharge. Right breast 10'oclock palpable mobile 2 cm mass, size has not changed much. No evidence of right axillary adenopathy. No evidence of any palpable mass on left breast. No evidence of left axillary adenopathy.    Lab Results  Component Value Date   WBC 10.0 08/01/2017   HGB 13.4 08/01/2017   HCT 39.4 08/01/2017   MCV 86.5 08/01/2017   PLT 250 08/01/2017   Recent Labs    07/18/17 0837 07/25/17 1338 08/01/17 0833  NA 137 134* 136  K 4.3 4.3 4.0  CL 103 101 101  CO2 '27 25 29  ' GLUCOSE 100* 97 103*  BUN '15 13 12  ' CREATININE 0.57 0.67 0.49  CALCIUM 9.2 8.9 9.0  GFRNONAA >60 >60 >60  GFRAA >60 >60 >60  PROT 7.3 7.3 7.2  ALBUMIN 4.0 4.1 4.0  AST '18 18 23  ' ALT 13* 11* 18  ALKPHOS 50 92 72  BILITOT 0.7 0.4 0.3    2D Echo was done which showed LVEF  55-65%, mild mitral valve regurgitation.    ASSESSMENT & PLAN:  1. Malignant neoplasm of upper-outer quadrant of both breasts in female, estrogen receptor positive ([place])   2. Encounter for antineoplastic chemotherapy   3. Family history of breast cancer    cT2N0M0, grade 2 Patient tolerated 2 cycles of dd AC counts are stable.  Okay to proceed with cycle 3 neoadjuvant chemotherapy ddAC with onpro-today..  CBC CMP prior to next cycle of chemotherapy. # referred her for genetic counseling and testing. Patient initially declined genetic testing. Discussed with patient about the benefit of having genetic testing may change her surgical management.  Today we had another lengthy discussion and the patient finally agreed to talk to genetic counselor.  Will refer.  #  Discussed with patient and advice her to see gyn physician to discuss about non hormone contraceptive options.  Today urine pregnancy test is negative. All questions were answered. The patient knows to call the clinic with any problems questions or concerns.  Return visit: 2 weeks prior to cycle 4 neoadjuvant ddAC. She will see my colleague Dr.Rao who covers me that day.  Plan obtain interim mammogram to evaluate treatment response.   [name], MD, PhD Hematology Oncology [facility] Pager- [phone] 08/15/2017

## 2017-08-15 ENCOUNTER — Encounter: Payer: Self-pay | Admitting: Oncology

## 2017-08-15 ENCOUNTER — Inpatient Hospital Stay (HOSPITAL_BASED_OUTPATIENT_CLINIC_OR_DEPARTMENT_OTHER): Payer: 59 | Admitting: Oncology

## 2017-08-15 ENCOUNTER — Inpatient Hospital Stay: Payer: 59

## 2017-08-15 ENCOUNTER — Inpatient Hospital Stay: Payer: 59 | Attending: Oncology

## 2017-08-15 ENCOUNTER — Other Ambulatory Visit: Payer: Self-pay

## 2017-08-15 VITALS — BP 139/85 | HR 101 | Temp 98.8°F | Wt 188.1 lb

## 2017-08-15 DIAGNOSIS — Z79899 Other long term (current) drug therapy: Secondary | ICD-10-CM | POA: Insufficient documentation

## 2017-08-15 DIAGNOSIS — Z5111 Encounter for antineoplastic chemotherapy: Secondary | ICD-10-CM | POA: Diagnosis present

## 2017-08-15 DIAGNOSIS — J45909 Unspecified asthma, uncomplicated: Secondary | ICD-10-CM | POA: Diagnosis not present

## 2017-08-15 DIAGNOSIS — C50411 Malignant neoplasm of upper-outer quadrant of right female breast: Secondary | ICD-10-CM | POA: Insufficient documentation

## 2017-08-15 DIAGNOSIS — C50412 Malignant neoplasm of upper-outer quadrant of left female breast: Secondary | ICD-10-CM | POA: Diagnosis not present

## 2017-08-15 DIAGNOSIS — Z170 Estrogen receptor positive status [ER+]: Secondary | ICD-10-CM | POA: Insufficient documentation

## 2017-08-15 DIAGNOSIS — Z803 Family history of malignant neoplasm of breast: Secondary | ICD-10-CM

## 2017-08-15 LAB — COMPREHENSIVE METABOLIC PANEL
ALBUMIN: 4.1 g/dL (ref 3.5–5.0)
ALT: 15 U/L (ref 14–54)
AST: 26 U/L (ref 15–41)
Alkaline Phosphatase: 70 U/L (ref 38–126)
Anion gap: 8 (ref 5–15)
BILIRUBIN TOTAL: 0.3 mg/dL (ref 0.3–1.2)
BUN: 8 mg/dL (ref 6–20)
CALCIUM: 8.9 mg/dL (ref 8.9–10.3)
CO2: 27 mmol/L (ref 22–32)
Chloride: 101 mmol/L (ref 101–111)
Creatinine, Ser: 0.53 mg/dL (ref 0.44–1.00)
GFR calc Af Amer: >60 mL/min (ref >60)
GFR calc non Af Amer: >60 mL/min (ref >60)
GLUCOSE: 95 mg/dL (ref 65–99)
POTASSIUM: 3.9 mmol/L (ref 3.5–5.1)
SODIUM: 136 mmol/L (ref 135–145)
TOTAL PROTEIN: 7.2 g/dL (ref 6.5–8.1)

## 2017-08-15 LAB — CBC WITH DIFFERENTIAL/PLATELET
BASOS ABS: 0.1 10*3/uL (ref 0–0.1)
Basophils Relative: 1 %
EOS PCT: 0 %
Eosinophils Absolute: 0 10*3/uL (ref 0–0.7)
HEMATOCRIT: 39.3 % (ref 35.0–47.0)
Hemoglobin: 13.5 g/dL (ref 12.0–16.0)
LYMPHS PCT: 20 %
Lymphs Abs: 1.8 10*3/uL (ref 1.0–3.6)
MCH: 29.6 pg (ref 26.0–34.0)
MCHC: 34.3 g/dL (ref 32.0–36.0)
MCV: 86.4 fL (ref 80.0–100.0)
MONO ABS: 0.7 10*3/uL (ref 0.2–0.9)
MONOS PCT: 8 %
Neutro Abs: 6.4 10*3/uL (ref 1.4–6.5)
Neutrophils Relative %: 71 %
Platelets: 281 10*3/uL (ref 150–440)
RBC: 4.55 MIL/uL (ref 3.80–5.20)
RDW: 12.6 % (ref 11.5–14.5)
WBC: 9 10*3/uL (ref 3.6–11.0)

## 2017-08-15 LAB — PREGNANCY, URINE: PREG TEST UR: NEGATIVE

## 2017-08-15 MED ORDER — CYCLOPHOSPHAMIDE CHEMO INJECTION 1 GM
600.0000 mg/m2 | Freq: Once | INTRAMUSCULAR | Status: AC
  Administered 2017-08-15: 1120 mg via INTRAVENOUS
  Filled 2017-08-15: qty 50

## 2017-08-15 MED ORDER — PALONOSETRON HCL INJECTION 0.25 MG/5ML
0.2500 mg | Freq: Once | INTRAVENOUS | Status: AC
  Administered 2017-08-15: 0.25 mg via INTRAVENOUS
  Filled 2017-08-15: qty 5

## 2017-08-15 MED ORDER — PEGFILGRASTIM 6 MG/0.6ML ~~LOC~~ PSKT
6.0000 mg | PREFILLED_SYRINGE | Freq: Once | SUBCUTANEOUS | Status: AC
  Administered 2017-08-15: 6 mg via SUBCUTANEOUS
  Filled 2017-08-15: qty 0.6

## 2017-08-15 MED ORDER — SODIUM CHLORIDE 0.9 % IV SOLN
Freq: Once | INTRAVENOUS | Status: AC
  Administered 2017-08-15: 10:00:00 via INTRAVENOUS
  Filled 2017-08-15: qty 5

## 2017-08-15 MED ORDER — SODIUM CHLORIDE 0.9 % IV SOLN
Freq: Once | INTRAVENOUS | Status: AC
  Administered 2017-08-15: 10:00:00 via INTRAVENOUS
  Filled 2017-08-15: qty 1000

## 2017-08-15 MED ORDER — HEPARIN SOD (PORK) LOCK FLUSH 100 UNIT/ML IV SOLN
500.0000 [IU] | Freq: Once | INTRAVENOUS | Status: AC
  Administered 2017-08-15: 500 [IU] via INTRAVENOUS
  Filled 2017-08-15: qty 5

## 2017-08-15 MED ORDER — SODIUM CHLORIDE 0.9% FLUSH
10.0000 mL | INTRAVENOUS | Status: DC | PRN
  Administered 2017-08-15: 10 mL via INTRAVENOUS
  Filled 2017-08-15: qty 10

## 2017-08-15 MED ORDER — DOXORUBICIN HCL CHEMO IV INJECTION 2 MG/ML
60.0000 mg/m2 | Freq: Once | INTRAVENOUS | Status: AC
  Administered 2017-08-15: 112 mg via INTRAVENOUS
  Filled 2017-08-15: qty 56

## 2017-08-15 NOTE — Progress Notes (Signed)
Patient here today for follow up.   

## 2017-08-15 NOTE — Telephone Encounter (Signed)
She has already left the office, she'll be back in 2 weeks

## 2017-08-15 NOTE — Telephone Encounter (Signed)
Please contact patient about testing again, Dr Tasia Catchings discussed with her again today and is agreeable

## 2017-08-29 ENCOUNTER — Inpatient Hospital Stay: Payer: 59

## 2017-08-29 ENCOUNTER — Inpatient Hospital Stay (HOSPITAL_BASED_OUTPATIENT_CLINIC_OR_DEPARTMENT_OTHER): Payer: 59 | Admitting: Oncology

## 2017-08-29 ENCOUNTER — Encounter: Payer: Self-pay | Admitting: Oncology

## 2017-08-29 VITALS — BP 130/82 | HR 82 | Temp 98.3°F | Resp 16 | Ht 64.0 in | Wt 190.6 lb

## 2017-08-29 DIAGNOSIS — Z79899 Other long term (current) drug therapy: Secondary | ICD-10-CM | POA: Diagnosis not present

## 2017-08-29 DIAGNOSIS — Z170 Estrogen receptor positive status [ER+]: Principal | ICD-10-CM

## 2017-08-29 DIAGNOSIS — Z5111 Encounter for antineoplastic chemotherapy: Secondary | ICD-10-CM

## 2017-08-29 DIAGNOSIS — C50411 Malignant neoplasm of upper-outer quadrant of right female breast: Secondary | ICD-10-CM

## 2017-08-29 DIAGNOSIS — C50412 Malignant neoplasm of upper-outer quadrant of left female breast: Secondary | ICD-10-CM

## 2017-08-29 LAB — CBC WITH DIFFERENTIAL/PLATELET
BASOS PCT: 1 %
Basophils Absolute: 0.1 10*3/uL (ref 0–0.1)
EOS ABS: 0.1 10*3/uL (ref 0–0.7)
Eosinophils Relative: 1 %
HEMATOCRIT: 37.1 % (ref 35.0–47.0)
HEMOGLOBIN: 12.7 g/dL (ref 12.0–16.0)
Lymphocytes Relative: 13 %
Lymphs Abs: 1.6 10*3/uL (ref 1.0–3.6)
MCH: 29.7 pg (ref 26.0–34.0)
MCHC: 34.2 g/dL (ref 32.0–36.0)
MCV: 86.8 fL (ref 80.0–100.0)
MONO ABS: 1 10*3/uL — AB (ref 0.2–0.9)
MONOS PCT: 8 %
NEUTROS ABS: 9.1 10*3/uL — AB (ref 1.4–6.5)
NEUTROS PCT: 77 %
Platelets: 306 10*3/uL (ref 150–440)
RBC: 4.27 MIL/uL (ref 3.80–5.20)
RDW: 13.5 % (ref 11.5–14.5)
WBC: 11.8 10*3/uL — ABNORMAL HIGH (ref 3.6–11.0)

## 2017-08-29 LAB — COMPREHENSIVE METABOLIC PANEL
ALK PHOS: 83 U/L (ref 38–126)
ALT: 15 U/L (ref 14–54)
ANION GAP: 11 (ref 5–15)
AST: 23 U/L (ref 15–41)
Albumin: 4.2 g/dL (ref 3.5–5.0)
BILIRUBIN TOTAL: 0.4 mg/dL (ref 0.3–1.2)
BUN: 13 mg/dL (ref 6–20)
CALCIUM: 8.9 mg/dL (ref 8.9–10.3)
CO2: 22 mmol/L (ref 22–32)
Chloride: 104 mmol/L (ref 101–111)
Creatinine, Ser: 0.45 mg/dL (ref 0.44–1.00)
GFR calc non Af Amer: >60 mL/min (ref >60)
Glucose, Bld: 106 mg/dL — ABNORMAL HIGH (ref 65–99)
Potassium: 3.8 mmol/L (ref 3.5–5.1)
Sodium: 137 mmol/L (ref 135–145)
Total Protein: 7 g/dL (ref 6.5–8.1)

## 2017-08-29 LAB — PREGNANCY, URINE: PREG TEST UR: NEGATIVE

## 2017-08-29 MED ORDER — PEGFILGRASTIM 6 MG/0.6ML ~~LOC~~ PSKT
6.0000 mg | PREFILLED_SYRINGE | Freq: Once | SUBCUTANEOUS | Status: AC
  Administered 2017-08-29: 6 mg via SUBCUTANEOUS
  Filled 2017-08-29: qty 0.6

## 2017-08-29 MED ORDER — DOXORUBICIN HCL CHEMO IV INJECTION 2 MG/ML
60.0000 mg/m2 | Freq: Once | INTRAVENOUS | Status: AC
  Administered 2017-08-29: 112 mg via INTRAVENOUS
  Filled 2017-08-29: qty 56

## 2017-08-29 MED ORDER — SODIUM CHLORIDE 0.9 % IV SOLN
600.0000 mg/m2 | Freq: Once | INTRAVENOUS | Status: AC
  Administered 2017-08-29: 1120 mg via INTRAVENOUS
  Filled 2017-08-29: qty 50

## 2017-08-29 MED ORDER — SODIUM CHLORIDE 0.9 % IV SOLN
Freq: Once | INTRAVENOUS | Status: AC
  Administered 2017-08-29: 10:00:00 via INTRAVENOUS
  Filled 2017-08-29: qty 5

## 2017-08-29 MED ORDER — SODIUM CHLORIDE 0.9% FLUSH
10.0000 mL | Freq: Once | INTRAVENOUS | Status: AC
  Administered 2017-08-29: 10 mL via INTRAVENOUS
  Filled 2017-08-29: qty 10

## 2017-08-29 MED ORDER — SODIUM CHLORIDE 0.9 % IV SOLN
Freq: Once | INTRAVENOUS | Status: AC
  Administered 2017-08-29: 09:00:00 via INTRAVENOUS
  Filled 2017-08-29: qty 1000

## 2017-08-29 MED ORDER — HEPARIN SOD (PORK) LOCK FLUSH 100 UNIT/ML IV SOLN
500.0000 [IU] | Freq: Once | INTRAVENOUS | Status: AC
  Administered 2017-08-29: 500 [IU] via INTRAVENOUS
  Filled 2017-08-29: qty 5

## 2017-08-29 MED ORDER — PALONOSETRON HCL INJECTION 0.25 MG/5ML
0.2500 mg | Freq: Once | INTRAVENOUS | Status: AC
  Administered 2017-08-29: 0.25 mg via INTRAVENOUS
  Filled 2017-08-29: qty 5

## 2017-08-29 NOTE — Progress Notes (Signed)
Hematology/Oncology Consult note [facility]  [phone] Fax:([phone]  Patient Care Team: [name], NP as PCP - General (Nurse Practitioner)   Name of the patient: Anne Wu  [id number]  Feb 01, 1981   Date of visit: 08/29/17  Diagnosis-invasive mammary carcinoma cT2 N0 M0 grade 2  Chief complaint/ Reason for visit-on treatment assessment prior to cycle #4 of dose dense AC chemotherapy  Heme/Onc history: Patient is a 37 year old female diagnosed with cT2 N0 M0 grade 2 invasive mammary carcinoma.  Diagnostic mammogram showed right upper quadrant 2.2 cm mass which was confirmed on biopsy to be ER PR positive and HER-2 FISH negative.  Patient was seen by Dr. [name] and he recommended neoadjuvant chemotherapy to facilitate surgery to obtain a negative margin and a better surgical outcome.  This patient sees Dr. Tasia Catchings and plan per her was to proceed with neoadjuvant chemotherapy.  She has received 3 cycles of dose dense AC so far  Interval history- patient mainly reports fatigue from chemotherapy but denies other complaints.  She denies any nausea or vomiting  ECOG PS- 0 Pain scale- 0   Review of systems- Review of Systems  Constitutional: Positive for malaise/fatigue. Negative for chills, fever and weight loss.  HENT: Negative for congestion, ear discharge and nosebleeds.   Eyes: Negative for blurred vision.  Respiratory: Negative for cough, hemoptysis, sputum production, shortness of breath and wheezing.   Cardiovascular: Negative for chest pain, palpitations, orthopnea and claudication.  Gastrointestinal: Negative for abdominal pain, blood in stool, constipation, diarrhea, heartburn, melena, nausea and vomiting.  Genitourinary: Negative for dysuria, flank pain, frequency, hematuria and urgency.  Musculoskeletal: Negative for back pain, joint pain and myalgias.  Skin: Negative for rash.  Neurological: Negative for dizziness,  tingling, focal weakness, seizures, weakness and headaches.  Endo/Heme/Allergies: Does not bruise/bleed easily.  Psychiatric/Behavioral: Negative for depression and suicidal ideas. The patient does not have insomnia.        Allergies  Allergen Reactions  . Codeine Other (See Comments)    Hands peeled     Past Medical History:  Diagnosis Date  . Asthma    AS A CHILD-NO INHALERS  . Breast cancer ([place])   . Elevated blood pressure, situational    PT STATES HER LAST COUPLE OF MD APPOINTMENTS SHE HAS HAD ELEVATED BP-NEVER HAD A PROBLEM WITH THIS PREVIOUSLY  . GERD (gastroesophageal reflux disease)    OCC- NO MEDS     Past Surgical History:  Procedure Laterality Date  . NO PAST SURGERIES    . PORTACATH PLACEMENT N/A 07/11/2017   Procedure: INSERTION PORT-A-CATH;  Surgeon: Leonie Green, MD;  Location: [facility] ORS;  Service: General;  Laterality: N/A;    Social History   Socioeconomic History  . Marital status: Married    Spouse name: Not on file  . Number of children: Not on file  . Years of education: Not on file  . Highest education level: Not on file  Social Needs  . Financial resource strain: Not on file  . Food insecurity - worry: Not on file  . Food insecurity - inability: Not on file  . Transportation needs - medical: Not on file  . Transportation needs - non-medical: Not on file  Occupational History  . Not on file  Tobacco Use  . Smoking status: Never Smoker  . Smokeless tobacco: Never Used  Substance and Sexual Activity  . Alcohol use: No    Alcohol/week: 0.0 oz  . Drug use: No  .  Sexual activity: Yes  Other Topics Concern  . Not on file  Social History Narrative  . Not on file    Family History  Problem Relation Age of Onset  . Breast cancer Maternal Aunt 48       currently late 10s  . Breast cancer Maternal Grandmother 68       second breast vs. recurrence ca at 45; deceased at 37  . Colon cancer Maternal Uncle 28       deceased in 67s    . Breast cancer Paternal Aunt        age at dx unknown; currently in 19s  . Breast cancer Paternal Grandmother        age at dx unknown  . Melanoma Mother        on leg  . Testicular cancer Maternal Uncle 69       currently 60s     Current Outpatient Medications:  .  acetaminophen (TYLENOL) 325 MG tablet, Take 650 mg by mouth every 6 (six) hours as needed (for pain.). , Disp: , Rfl:  .  HYDROcodone-acetaminophen (NORCO) 5-325 MG tablet, Take 1-2 tablets by mouth every 6 (six) hours as needed for moderate pain., Disp: 6 tablet, Rfl: 0 .  lidocaine-prilocaine (EMLA) cream, Apply to affected area once, Disp: 30 g, Rfl: 3 .  loratadine (CLARITIN) 10 MG tablet, Take 10 mg by mouth daily. One tablet 3 days after treatment, Disp: , Rfl:  .  Multiple Vitamin (MULTI-VITAMINS) TABS, Take by mouth., Disp: , Rfl:  .  Multiple Vitamin (MULTIVITAMIN WITH MINERALS) TABS tablet, Take 1 tablet by mouth daily., Disp: , Rfl:  .  ondansetron (ZOFRAN) 8 MG tablet, Take 1 tablet (8 mg total) by mouth 2 (two) times daily as needed. Start on the third day after chemotherapy., Disp: 30 tablet, Rfl: 1 .  prochlorperazine (COMPAZINE) 10 MG tablet, Take 1 tablet (10 mg total) by mouth every 6 (six) hours as needed (Nausea or vomiting)., Disp: 30 tablet, Rfl: 1 .  chlorhexidine (PERIDEX) 0.12 % solution, Use as directed 15 mLs in the mouth or throat 2 (two) times daily. (Patient not taking: Reported on 08/29/2017), Disp: 473 mL, Rfl: 0 .  ibuprofen (ADVIL,MOTRIN) 200 MG tablet, Take 400 mg by mouth every 8 (eight) hours as needed (for pain.)., Disp: , Rfl:  No current facility-administered medications for this visit.   Facility-Administered Medications Ordered in Other Visits:  .  heparin lock flush 100 unit/mL, 500 Units, Intravenous, Once, [name], MD .  sodium chloride flush (NS) 0.9 % injection 10 mL, 10 mL, Intravenous, PRN, [name], MD, 10 mL at 08/01/17 0829  Physical exam:  Vitals:   08/29/17 0844  BP:  130/82  Pulse: 82  Resp: 16  Temp: 98.3 F (36.8 C)  TempSrc: Tympanic  Weight: 190 lb 9.6 oz (86.5 kg)  Height: _0  (1.626 m)   Physical Exam  Constitutional: She is oriented to person, place, and time and well-developed, well-nourished, and in no distress.  HENT:  Head: Normocephalic and atraumatic.  Eyes: EOM are normal. Pupils are equal, round, and reactive to light.  Neck: Normal range of motion.  Cardiovascular: Normal rate, regular rhythm and normal heart sounds.  Pulmonary/Chest: Effort normal and breath sounds normal.  Abdominal: Soft. Bowel sounds are normal.  Neurological: She is alert and oriented to person, place, and time.  Skin: Skin is warm and dry.  Breast exam not done today  CMP Latest Ref Rng & Units  08/29/2017  Glucose 65 - 99 mg/dL 106(H)  BUN 6 - 20 mg/dL 13  Creatinine 0.44 - 1.00 mg/dL 0.45  Sodium 135 - 145 mmol/L 137  Potassium 3.5 - 5.1 mmol/L 3.8  Chloride 101 - 111 mmol/L 104  CO2 22 - 32 mmol/L 22  Calcium 8.9 - 10.3 mg/dL 8.9  Total Protein 6.5 - 8.1 g/dL 7.0  Total Bilirubin 0.3 - 1.2 mg/dL 0.4  Alkaline Phos 38 - 126 U/L 83  AST 15 - 41 U/L 23  ALT 14 - 54 U/L 15   CBC Latest Ref Rng & Units 08/29/2017  WBC 3.6 - 11.0 K/uL 11.8(H)  Hemoglobin 12.0 - 16.0 g/dL 12.7  Hematocrit 35.0 - 47.0 % 37.1  Platelets 150 - 440 K/uL 306     Assessment and plan- Patient is a 37 y.o. female with grade 2CT2N0M0 invasive mammary carcinoma currently undergoing neoadjuvant chemotherapy here for on treatment assessment prior to cycle #4 of dose dense AC chemotherapy  Counts okay to proceed with cycle #4 of dose dense AC chemotherapy today with on pro-Neulasta support.  Dr. Tasia Catchings will see her in 2 weeks time with CBC and CMP to start cycle 1 of Taxol  I will obtain a mammogram and ultrasound in the interim to assess response to neoadjuvant chemotherapy so far.  She would also be getting her specimen collected for genetic testing    Visit Diagnosis 1.  Malignant neoplasm of upper-outer quadrant of right breast in female, estrogen receptor positive ([place])   2. Encounter for antineoplastic chemotherapy      Dr. Randa Evens, MD, MPH [facility] Pager- [phone] 08/29/2017 11:24 AM

## 2017-08-29 NOTE — Progress Notes (Signed)
No new changes today. 

## 2017-09-05 ENCOUNTER — Encounter: Payer: Self-pay | Admitting: Genetic Counselor

## 2017-09-05 ENCOUNTER — Telehealth: Payer: Self-pay | Admitting: Genetic Counselor

## 2017-09-05 DIAGNOSIS — Z1379 Encounter for other screening for genetic and chromosomal anomalies: Secondary | ICD-10-CM

## 2017-09-05 HISTORY — DX: Encounter for other screening for genetic and chromosomal anomalies: Z13.79

## 2017-09-05 NOTE — Telephone Encounter (Signed)
Cancer Genetics             Telegenetics Results Disclosure   Patient Name: Anne Wu Patient DOB: [birth year] Patient Age: 37 y.o. Phone Call Date: 09/05/2017  Referring Provider: [name], MD    [name] was called today to discuss genetic test results. Please see the Genetics telephone note from 07/21/17 for a detailed discussion of her personal and family histories and the recommendations provided.  Genetic Testing: At the time of [name]'s telegenetics visit, she decided to pursue genetic testing of multiple genes associated with hereditary susceptibility to cancer. Testing included sequencing and deletion/duplication analysis. Testing did not reveal any pathogenic mutation in any of these genes.  A copy of the genetic test report will be scanned into Epic under the Media tab.  The genes analyzed were the 23 genes on [facility]'s Breast/GYN panel (ATM, BARD1, BRCA1, BRCA2, BRIP1, CDH1, CHEK2, DICER1, EPCAM, MLH1,  MSH2, MSH6, NBN, NF1, PALB2, PMS2, PTEN, RAD50, RAD51C, RAD51D,SMARCA4, STK11, and TP53).  Since the current test is not perfect, it is possible that there may be a gene mutation that current testing cannot detect, but that chance is small. It is possible that a different genetic factor, which has not yet been discovered or is not on this panel, is responsible for the cancer diagnoses in the family. Again, the likelihood of this is low. No additional testing is recommended at this time for [name].  Cancer Screening: [name] is recommended to follow the cancer screening guidelines provided by her physicians.   Family Members: Given the young age of breast cancer in the family, and particularly in [name] (age 69), women are recommended to speak with their own providers about initiating breast screenings in their late 72s.  Any relative who had cancer at a young age or had a particularly rare cancer may also wish to pursue genetic testing.  Genetic counselors can be located in other cities, by visiting the website of the [employer] (Artist[employer].se) and Field seismologist for a Dietitian by zip code.    Lastly, cancer genetics is a rapidly advancing field and it is possible that new genetic tests will be appropriate for [name] in the future. We encourage [name] to remain in contact with Genetics on an annual basis so her personal and family histories can be updated.    Steele Berg, MS, [place] Certified Genetic Counselor phone: [phone]

## 2017-09-08 ENCOUNTER — Ambulatory Visit
Admission: RE | Admit: 2017-09-08 | Discharge: 2017-09-08 | Disposition: A | Discharge status: Home / Self Care | Payer: 59 | Source: Ambulatory Visit | Attending: Oncology | Admitting: Oncology

## 2017-09-08 ENCOUNTER — Encounter: Payer: Self-pay | Admitting: Radiology

## 2017-09-08 DIAGNOSIS — C50412 Malignant neoplasm of upper-outer quadrant of left female breast: Secondary | ICD-10-CM | POA: Insufficient documentation

## 2017-09-08 DIAGNOSIS — C50411 Malignant neoplasm of upper-outer quadrant of right female breast: Secondary | ICD-10-CM

## 2017-09-08 DIAGNOSIS — Z170 Estrogen receptor positive status [ER+]: Principal | ICD-10-CM

## 2017-09-08 HISTORY — DX: Personal history of antineoplastic chemotherapy: Z92.21

## 2017-09-09 ENCOUNTER — Encounter: Payer: Self-pay | Admitting: Oncology

## 2017-09-12 ENCOUNTER — Encounter: Payer: Self-pay | Admitting: Oncology

## 2017-09-12 ENCOUNTER — Inpatient Hospital Stay: Payer: 59

## 2017-09-12 ENCOUNTER — Inpatient Hospital Stay (HOSPITAL_BASED_OUTPATIENT_CLINIC_OR_DEPARTMENT_OTHER): Payer: 59 | Admitting: Oncology

## 2017-09-12 ENCOUNTER — Inpatient Hospital Stay: Payer: 59 | Attending: Oncology

## 2017-09-12 VITALS — BP 127/85 | HR 99 | Temp 99.0°F

## 2017-09-12 VITALS — BP 119/82 | HR 112 | Temp 98.0°F | Resp 18 | Ht 64.0 in | Wt 185.8 lb

## 2017-09-12 DIAGNOSIS — C50412 Malignant neoplasm of upper-outer quadrant of left female breast: Principal | ICD-10-CM

## 2017-09-12 DIAGNOSIS — Z79899 Other long term (current) drug therapy: Secondary | ICD-10-CM

## 2017-09-12 DIAGNOSIS — Z5111 Encounter for antineoplastic chemotherapy: Secondary | ICD-10-CM | POA: Diagnosis present

## 2017-09-12 DIAGNOSIS — C50411 Malignant neoplasm of upper-outer quadrant of right female breast: Secondary | ICD-10-CM | POA: Diagnosis not present

## 2017-09-12 DIAGNOSIS — Z170 Estrogen receptor positive status [ER+]: Principal | ICD-10-CM

## 2017-09-12 DIAGNOSIS — K219 Gastro-esophageal reflux disease without esophagitis: Secondary | ICD-10-CM | POA: Insufficient documentation

## 2017-09-12 DIAGNOSIS — J45909 Unspecified asthma, uncomplicated: Secondary | ICD-10-CM | POA: Insufficient documentation

## 2017-09-12 DIAGNOSIS — I10 Essential (primary) hypertension: Secondary | ICD-10-CM | POA: Insufficient documentation

## 2017-09-12 DIAGNOSIS — Z803 Family history of malignant neoplasm of breast: Secondary | ICD-10-CM

## 2017-09-12 LAB — COMPREHENSIVE METABOLIC PANEL
ALT: 18 U/L (ref 14–54)
ANION GAP: 9 (ref 5–15)
AST: 27 U/L (ref 15–41)
Albumin: 4.1 g/dL (ref 3.5–5.0)
Alkaline Phosphatase: 86 U/L (ref 38–126)
BILIRUBIN TOTAL: 0.3 mg/dL (ref 0.3–1.2)
BUN: 11 mg/dL (ref 6–20)
CO2: 26 mmol/L (ref 22–32)
Calcium: 9.2 mg/dL (ref 8.9–10.3)
Chloride: 102 mmol/L (ref 101–111)
Creatinine, Ser: 0.53 mg/dL (ref 0.44–1.00)
Glucose, Bld: 96 mg/dL (ref 65–99)
POTASSIUM: 3.9 mmol/L (ref 3.5–5.1)
Sodium: 137 mmol/L (ref 135–145)
TOTAL PROTEIN: 7.3 g/dL (ref 6.5–8.1)

## 2017-09-12 LAB — CBC WITH DIFFERENTIAL/PLATELET
BASOS ABS: 0 10*3/uL (ref 0–0.1)
Basophils Relative: 1 %
EOS PCT: 0 %
Eosinophils Absolute: 0 10*3/uL (ref 0–0.7)
HCT: 36.7 % (ref 35.0–47.0)
HEMOGLOBIN: 12.8 g/dL (ref 12.0–16.0)
LYMPHS PCT: 19 %
Lymphs Abs: 1.5 10*3/uL (ref 1.0–3.6)
MCH: 30.2 pg (ref 26.0–34.0)
MCHC: 34.8 g/dL (ref 32.0–36.0)
MCV: 86.8 fL (ref 80.0–100.0)
Monocytes Absolute: 0.8 10*3/uL (ref 0.2–0.9)
Monocytes Relative: 10 %
NEUTROS ABS: 5.6 10*3/uL (ref 1.4–6.5)
Neutrophils Relative %: 70 %
Platelets: 333 10*3/uL (ref 150–440)
RBC: 4.23 MIL/uL (ref 3.80–5.20)
RDW: 14.9 % — AB (ref 11.5–14.5)
WBC: 7.9 10*3/uL (ref 3.6–11.0)

## 2017-09-12 LAB — PREGNANCY, URINE: PREG TEST UR: NEGATIVE

## 2017-09-12 MED ORDER — HEPARIN SOD (PORK) LOCK FLUSH 100 UNIT/ML IV SOLN
500.0000 [IU] | Freq: Once | INTRAVENOUS | Status: AC | PRN
  Administered 2017-09-12: 500 [IU]
  Filled 2017-09-12: qty 5

## 2017-09-12 MED ORDER — SODIUM CHLORIDE 0.9 % IV SOLN
20.0000 mg | Freq: Once | INTRAVENOUS | Status: AC
  Administered 2017-09-12: 20 mg via INTRAVENOUS
  Filled 2017-09-12: qty 2

## 2017-09-12 MED ORDER — FAMOTIDINE IN NACL 20-0.9 MG/50ML-% IV SOLN: 20.0000 mg | Freq: Once | INTRAVENOUS | Status: DC

## 2017-09-12 MED ORDER — SODIUM CHLORIDE 0.9 % IV SOLN
Freq: Once | INTRAVENOUS | Status: AC
  Administered 2017-09-12: 10:00:00 via INTRAVENOUS
  Filled 2017-09-12: qty 1000

## 2017-09-12 MED ORDER — DIPHENHYDRAMINE HCL 50 MG/ML IJ SOLN
50.0000 mg | Freq: Once | INTRAMUSCULAR | Status: AC
  Administered 2017-09-12: 50 mg via INTRAVENOUS
  Filled 2017-09-12: qty 1

## 2017-09-12 MED ORDER — SODIUM CHLORIDE 0.9 % IV SOLN
80.0000 mg/m2 | Freq: Once | INTRAVENOUS | Status: AC
  Administered 2017-09-12: 150 mg via INTRAVENOUS
  Filled 2017-09-12: qty 25

## 2017-09-12 MED ORDER — SODIUM CHLORIDE 0.9 % IV SOLN
Freq: Once | INTRAVENOUS | Status: AC
  Administered 2017-09-12: 11:00:00 via INTRAVENOUS
  Filled 2017-09-12: qty 100

## 2017-09-12 NOTE — Progress Notes (Signed)
Hematology/Oncology Follow up note [facility] Telephone:([phone] Fax:([phone]   Patient Care Team: [name], NP as PCP - General (Nurse Practitioner)  REFERRING PROVIDER: [name], NP   REASON FOR VISIT on chemotherapy management of breast cancer   HISTORY OF PRESENTING ILLNESS:  Anne Wu is a  37 y.o.  female with diagnosis of cT2N0M0, grade 2 invasive mammary carcinoma.  She has significant family history on both maternal side as well as paternal side. She felt a right breast mass 2 months ago and diagnostic mammogram showed right upper quandrant 2.2 cm mass, which was confirmed on [place] and biopsied. Right axillary no clinically suspicious lymph node. Pathology showed invasive mammary carcinoma, ER/PR positive,, HER2 IHC  equivocal, FISH negative.   # Patient was evaluated by Dr.Smith. From surgical standpoint, Dr.Smith recommend neoadjuvant chemotherapy to facilitate surgery to obtain negative margin and better surgical outcome. Also given patient's young age, neoadjuvant chemotherapy is reasonable.    #Patient stated that she does not desire future fertility.  # 2D Echo showed normal systolic function, mild mitral regurgitation.   # Testing did not reveal any pathogenic mutation in any of these genes. A copy of the genetic test report will be scanned into Epic under the Media tab.The genes analyzed were the 23 genes on [facility]'s Breast/GYN panel (ATM, BARD1, BRCA1, BRCA2, BRIP1, CDH1, CHEK2, DICER1, EPCAM, MLH1,  MSH2, MSH6, NBN, NF1, PALB2, PMS2, PTEN, RAD50, RAD51C, RAD51D,SMARCA4, STK11, and TP53).    INTERVAL HISTORY Anne Wu is a 37 y.o. female who has above history reviewed by me today presents for evaluation prior to cycle 1  neoadjuvant Taxol for treatment of breast cancer (cT2cN0cM0).  She reports feeling well. She has had interval mammogram done which showed good treatment response. Denies nausea vomiting or  diarrhea.    Review of Systems  Constitutional: Negative for chills and fever.  HENT: Negative for congestion and hearing loss.   Respiratory: Negative for cough and hemoptysis.   Cardiovascular: Negative for chest pain, palpitations and orthopnea.  Gastrointestinal: Positive for nausea. Negative for abdominal pain and vomiting.  Musculoskeletal: Negative for back pain, myalgias and neck pain.  Skin: Negative for rash.  Neurological: Negative for dizziness, tingling, sensory change and weakness.  Endo/Heme/Allergies: Does not bruise/bleed easily.  Psychiatric/Behavioral: Negative for depression.    MEDICAL HISTORY:  Past Medical History:  Diagnosis Date  . Asthma    AS A CHILD-NO INHALERS  . Breast cancer ([employer])   . Elevated blood pressure, situational    PT STATES HER LAST COUPLE OF MD APPOINTMENTS SHE HAS HAD ELEVATED BP-NEVER HAD A PROBLEM WITH THIS PREVIOUSLY  . Genetic testing 09/05/2017   Breast/GYN panel (23 genes) @ [facility] - No pathogenic mutations detected  . GERD (gastroesophageal reflux disease)    OCC- NO MEDS  . Personal history of chemotherapy     SURGICAL HISTORY: Past Surgical History:  Procedure Laterality Date  . BREAST BIOPSY Right 06/20/2017   Invasive Mammary Carcinoma  . NO PAST SURGERIES    . PORTACATH PLACEMENT N/A 07/11/2017   Procedure: INSERTION PORT-A-CATH;  Surgeon: Leonie Green, MD;  Location: [facility] ORS;  Service: General;  Laterality: N/A;    SOCIAL HISTORY: Social History   Socioeconomic History  . Marital status: Married    Spouse name: Not on file  . Number of children: Not on file  . Years of education: Not on file  . Highest education level: Not on file  Social Needs  .  Financial resource strain: Not on file  . Food insecurity - worry: Not on file  . Food insecurity - inability: Not on file  . Transportation needs - medical: Not on file  . Transportation needs - non-medical: Not on file  Occupational History  . Not on  file  Tobacco Use  . Smoking status: Never Smoker  . Smokeless tobacco: Never Used  Substance and Sexual Activity  . Alcohol use: No    Alcohol/week: 0.0 oz  . Drug use: No  . Sexual activity: Yes  Other Topics Concern  . Not on file  Social History Narrative  . Not on file    FAMILY HISTORY: Family History  Problem Relation Age of Onset  . Breast cancer Maternal Aunt 78       currently late 17s  . Breast cancer Maternal Grandmother 68       second breast vs. recurrence ca at 2; deceased at 42  . Colon cancer Maternal Uncle 24       deceased in 63s  . Breast cancer Paternal Aunt        age at dx unknown; currently in 93s  . Breast cancer Paternal Grandmother        age at dx unknown  . Melanoma Mother        on leg  . Testicular cancer Maternal Uncle 58       currently 34s    ALLERGIES:  is allergic to codeine.  MEDICATIONS:  Current Outpatient Medications  Medication Sig Dispense Refill  . acetaminophen (TYLENOL) 325 MG tablet Take 650 mg by mouth every 6 (six) hours as needed (for pain.).     Marland Kitchen chlorhexidine (PERIDEX) 0.12 % solution Use as directed 15 mLs in the mouth or throat 2 (two) times daily. 473 mL 0  . HYDROcodone-acetaminophen (NORCO) 5-325 MG tablet Take 1-2 tablets by mouth every 6 (six) hours as needed for moderate pain. 6 tablet 0  . lidocaine-prilocaine (EMLA) cream Apply to affected area once 30 g 3  . loratadine (CLARITIN) 10 MG tablet Take 10 mg by mouth daily. One tablet 3 days after treatment    . Multiple Vitamin (MULTIVITAMIN WITH MINERALS) TABS tablet Take 1 tablet by mouth daily.    . ondansetron (ZOFRAN) 8 MG tablet Take 1 tablet (8 mg total) by mouth 2 (two) times daily as needed. Start on the third day after chemotherapy. 30 tablet 1  . prochlorperazine (COMPAZINE) 10 MG tablet Take 1 tablet (10 mg total) by mouth every 6 (six) hours as needed (Nausea or vomiting). 30 tablet 1  . ibuprofen (ADVIL,MOTRIN) 200 MG tablet Take 400 mg by mouth  every 8 (eight) hours as needed (for pain.).    Marland Kitchen Multiple Vitamin (MULTI-VITAMINS) TABS Take by mouth.     No current facility-administered medications for this visit.    Facility-Administered Medications Ordered in Other Visits  Medication Dose Route Frequency Provider Last Rate Last Dose  . 0.9 %  sodium chloride infusion   Intravenous Once [name], MD      . dexamethasone (DECADRON) 20 mg in sodium chloride 0.9 % 50 mL IVPB  20 mg Intravenous Once [name], MD      . diphenhydrAMINE (BENADRYL) injection 50 mg  50 mg Intravenous Once [name], MD      . heparin lock flush 100 unit/mL  500 Units Intravenous Once [name], MD      . heparin lock flush 100 unit/mL  500 Units Intracatheter Once  PRN [name], MD      . PACLitaxel (TAXOL) 150 mg in sodium chloride 0.9 % 250 mL chemo infusion (</= 1m/m2)  80 mg/m2 (Treatment Plan Recorded) Intravenous Once YEarlie Server MD      . sodium chloride 0.9 % 100 mL with famotidine (PEPCID) 20 mg infusion   Intravenous Once YEarlie Server MD      . sodium chloride flush (NS) 0.9 % injection 10 mL  10 mL Intravenous PRN YEarlie Server MD   10 mL at 08/01/17 0829     PHYSICAL EXAMINATION: ECOG PERFORMANCE STATUS: 0 - Asymptomatic Vitals:   09/12/17 0850  BP: 119/82  Pulse: (!) 112  Resp: 18  Temp: 98 F (36.7 C)   Filed Weights   09/12/17 0850  Weight: 185 lb 12.8 oz (84.3 kg)    Physical Exam  Constitutional: She is oriented to person, place, and time and well-developed, well-nourished, and in no distress. No distress.  HENT:  Head: Normocephalic and atraumatic.  Mouth/Throat: Oropharynx is clear and moist. No oropharyngeal exudate.  Eyes: EOM are normal. Pupils are equal, round, and reactive to light. Right eye exhibits no discharge. Left eye exhibits no discharge. No scleral icterus.  Neck: Normal range of motion. Neck supple.  Cardiovascular: Normal rate, regular rhythm, normal heart sounds and intact distal pulses.  No murmur  heard. Pulmonary/Chest: Effort normal and breath sounds normal. No stridor. She has no wheezes. She has no rales.  Abdominal: Soft. Bowel sounds are normal. She exhibits no distension and no mass. There is no rebound and no guarding.  Musculoskeletal: Normal range of motion. She exhibits no edema or deformity.  Lymphadenopathy:    She has no cervical adenopathy.  Neurological: She is alert and oriented to person, place, and time. No cranial nerve deficit.  Skin: Skin is warm and dry. No rash noted. She is not diaphoretic. No erythema.  Psychiatric: Mood, memory, affect and judgment normal.      Lab Results  Component Value Date   WBC 7.9 09/12/2017   HGB 12.8 09/12/2017   HCT 36.7 09/12/2017   MCV 86.8 09/12/2017   PLT 333 09/12/2017   Recent Labs    08/15/17 0814 08/29/17 0810 09/12/17 0830  NA 136 137 137  K 3.9 3.8 3.9  CL 101 104 102  CO2 '27 22 26  ' GLUCOSE 95 106* 96  BUN '8 13 11  ' CREATININE 0.53 0.45 0.53  CALCIUM 8.9 8.9 9.2  GFRNONAA >60 >60 >60  GFRAA >60 >60 >60  PROT 7.2 7.0 7.3  ALBUMIN 4.1 4.2 4.1  AST '26 23 27  ' ALT '15 15 18  ' ALKPHOS 70 83 86  BILITOT 0.3 0.4 0.3    2D Echo was done which showed LVEF 55-65%, mild mitral valve regurgitation.    ASSESSMENT & PLAN:  1. Malignant neoplasm of upper-outer quadrant of right breast in [place], [place]   2. Encounter for antineoplastic chemotherapy   3. Family history of breast cancer    cT2N0M0, grade 2 Patient tolerated 4 cycles of dd AC counts are stable.  Today urine pregnancy test is negative. Okay to proceed with cycle one Taxol today..  CBC CMP prior to next cycle of chemotherapy.  # Genetic testing negative.  #  All questions were answered. The patient knows to call the clinic with any problems questions or concerns.  Return visit: 1 week for cycle 2 taxol.  ZEarlie Server MD, PhD [facility]ematology [facility]  [facility] Pager-  [phone] 09/12/2017

## 2017-09-12 NOTE — Progress Notes (Signed)
No new changes noted today 

## 2017-09-19 ENCOUNTER — Inpatient Hospital Stay: Payer: 59

## 2017-09-19 ENCOUNTER — Other Ambulatory Visit: Payer: Self-pay

## 2017-09-19 ENCOUNTER — Encounter: Payer: Self-pay | Admitting: Oncology

## 2017-09-19 ENCOUNTER — Inpatient Hospital Stay (HOSPITAL_BASED_OUTPATIENT_CLINIC_OR_DEPARTMENT_OTHER): Payer: 59 | Admitting: Oncology

## 2017-09-19 VITALS — BP 134/82 | HR 107 | Temp 98.7°F | Wt 187.3 lb

## 2017-09-19 DIAGNOSIS — Z170 Estrogen receptor positive status [ER+]: Principal | ICD-10-CM

## 2017-09-19 DIAGNOSIS — Z5111 Encounter for antineoplastic chemotherapy: Secondary | ICD-10-CM

## 2017-09-19 DIAGNOSIS — C50412 Malignant neoplasm of upper-outer quadrant of left female breast: Principal | ICD-10-CM

## 2017-09-19 DIAGNOSIS — C50411 Malignant neoplasm of upper-outer quadrant of right female breast: Secondary | ICD-10-CM | POA: Diagnosis not present

## 2017-09-19 DIAGNOSIS — Z79899 Other long term (current) drug therapy: Secondary | ICD-10-CM | POA: Diagnosis not present

## 2017-09-19 LAB — COMPREHENSIVE METABOLIC PANEL
ALBUMIN: 3.9 g/dL (ref 3.5–5.0)
ALT: 39 U/L (ref 14–54)
ANION GAP: 7 (ref 5–15)
AST: 41 U/L (ref 15–41)
Alkaline Phosphatase: 57 U/L (ref 38–126)
BUN: 11 mg/dL (ref 6–20)
CO2: 27 mmol/L (ref 22–32)
Calcium: 8.9 mg/dL (ref 8.9–10.3)
Chloride: 103 mmol/L (ref 101–111)
Creatinine, Ser: 0.48 mg/dL (ref 0.44–1.00)
GFR calc Af Amer: >60 mL/min (ref >60)
GFR calc non Af Amer: >60 mL/min (ref >60)
GLUCOSE: 122 mg/dL — AB (ref 65–99)
POTASSIUM: 3.9 mmol/L (ref 3.5–5.1)
SODIUM: 137 mmol/L (ref 135–145)
Total Bilirubin: 0.4 mg/dL (ref 0.3–1.2)
Total Protein: 6.7 g/dL (ref 6.5–8.1)

## 2017-09-19 LAB — PREGNANCY, URINE: Preg Test, Ur: NEGATIVE

## 2017-09-19 LAB — CBC WITH DIFFERENTIAL/PLATELET
Basophils Absolute: 0 10*3/uL (ref 0–0.1)
Basophils Relative: 1 %
Eosinophils Absolute: 0 10*3/uL (ref 0–0.7)
Eosinophils Relative: 1 %
HEMATOCRIT: 33.7 % — AB (ref 35.0–47.0)
Hemoglobin: 11.7 g/dL — ABNORMAL LOW (ref 12.0–16.0)
LYMPHS PCT: 17 %
Lymphs Abs: 0.7 10*3/uL — ABNORMAL LOW (ref 1.0–3.6)
MCH: 30.5 pg (ref 26.0–34.0)
MCHC: 34.7 g/dL (ref 32.0–36.0)
MCV: 87.8 fL (ref 80.0–100.0)
MONO ABS: 0.4 10*3/uL (ref 0.2–0.9)
MONOS PCT: 9 %
NEUTROS ABS: 3.2 10*3/uL (ref 1.4–6.5)
Neutrophils Relative %: 72 %
Platelets: 328 10*3/uL (ref 150–440)
RBC: 3.83 MIL/uL (ref 3.80–5.20)
RDW: 15.3 % — AB (ref 11.5–14.5)
WBC: 4.4 10*3/uL (ref 3.6–11.0)

## 2017-09-19 MED ORDER — FAMOTIDINE IN NACL 20-0.9 MG/50ML-% IV SOLN
20.0000 mg | Freq: Once | INTRAVENOUS | Status: DC
  Filled 2017-09-19: qty 50

## 2017-09-19 MED ORDER — SODIUM CHLORIDE 0.9 % IV SOLN
Freq: Once | INTRAVENOUS | Status: AC
  Administered 2017-09-19: 11:00:00 via INTRAVENOUS
  Filled 2017-09-19: qty 100

## 2017-09-19 MED ORDER — SODIUM CHLORIDE 0.9 % IV SOLN
Freq: Once | INTRAVENOUS | Status: AC
  Administered 2017-09-19: 11:00:00 via INTRAVENOUS
  Filled 2017-09-19: qty 1000

## 2017-09-19 MED ORDER — DIPHENHYDRAMINE HCL 50 MG/ML IJ SOLN
50.0000 mg | Freq: Once | INTRAMUSCULAR | Status: AC
  Administered 2017-09-19: 50 mg via INTRAVENOUS
  Filled 2017-09-19: qty 1

## 2017-09-19 MED ORDER — SODIUM CHLORIDE 0.9 % IV SOLN
80.0000 mg/m2 | Freq: Once | INTRAVENOUS | Status: AC
  Administered 2017-09-19: 150 mg via INTRAVENOUS
  Filled 2017-09-19: qty 25

## 2017-09-19 MED ORDER — SODIUM CHLORIDE 0.9 % IV SOLN
20.0000 mg | Freq: Once | INTRAVENOUS | Status: AC
  Administered 2017-09-19: 20 mg via INTRAVENOUS
  Filled 2017-09-19: qty 2

## 2017-09-19 MED ORDER — HEPARIN SOD (PORK) LOCK FLUSH 100 UNIT/ML IV SOLN
500.0000 [IU] | Freq: Once | INTRAVENOUS | Status: AC
  Administered 2017-09-19: 500 [IU] via INTRAVENOUS
  Filled 2017-09-19: qty 5

## 2017-09-19 NOTE — Progress Notes (Signed)
Hematology/Oncology Follow up note [facility] Telephone:([phone] Fax:(336) [phone]   Patient Care Team: [name], NP as PCP - General (Nurse Practitioner)  REFERRING PROVIDER: [name], NP   REASON FOR VISIT on chemotherapy management of breast cancer   HISTORY OF PRESENTING ILLNESS:  Anne Wu is a  37 y.o.  female with diagnosis of cT2N0M0, grade 2 invasive mammary carcinoma.  She has significant family history on both maternal side as well as paternal side. She felt a right breast mass 2 months ago and diagnostic mammogram showed right upper quandrant 2.2 cm mass, which was confirmed on [place] and biopsied. Right axillary no clinically suspicious lymph node. Pathology showed invasive mammary carcinoma, ER/PR positive,, HER2 IHC  equivocal, FISH negative.   # Patient was evaluated by Dr.Smith. From surgical standpoint, Dr.Smith recommend neoadjuvant chemotherapy to facilitate surgery to obtain negative margin and better surgical outcome. Also given patient's young age, neoadjuvant chemotherapy is reasonable.    #Patient stated that she does not desire future fertility.  # 2D Echo showed normal systolic function, mild mitral regurgitation.   # Testing did not reveal any pathogenic mutation in any of these genes. A copy of the genetic test report will be scanned into Epic under the Media tab.The genes analyzed were the 23 genes on [facility]'s Breast/GYN panel (ATM, BARD1, BRCA1, BRCA2, BRIP1, CDH1, CHEK2, DICER1, EPCAM, MLH1,  MSH2, MSH6, NBN, NF1, PALB2, PMS2, PTEN, RAD50, RAD51C, RAD51D,SMARCA4, STK11, and TP53).  09/08/2017 Interval mammogram after 4 cycle of AC, showed good treatment response. The biopsy-proven malignancy in the upper outer quadrant of the right breast at posterior depth, associated with scattered microcalcifications and architectural distortion, has significantly decreased in size since the mammogram 06/20/2017. On today's  mammogram, the mass measures approximately 1.7 x 2.2 x 1.2 cm (previously 2.4 x 1.6 x 2.7 cm).   INTERVAL HISTORY Anne Wu is a 37 y.o. female who has above history reviewed by me today presents for evaluation prior to cycle 2  neoadjuvant Taxol for treatment of breast cancer (cT2cN0cM0).  She reports feeling well.  Denies nausea vomiting or diarrhea. Denies any neuropathy symptoms.    Review of Systems  Constitutional: Negative for chills and fever.  HENT: Negative for congestion and hearing loss.   Respiratory: Negative for cough and hemoptysis.   Cardiovascular: Negative for chest pain, palpitations and orthopnea.  Gastrointestinal: Negative for abdominal pain, nausea and vomiting.  Musculoskeletal: Negative for back pain, myalgias and neck pain.  Skin: Negative for rash.  Neurological: Negative for dizziness, tingling, sensory change and weakness.  Endo/Heme/Allergies: Does not bruise/bleed easily.  Psychiatric/Behavioral: Negative for depression.    MEDICAL HISTORY:  Past Medical History:  Diagnosis Date  . Asthma    AS A CHILD-NO INHALERS  . Breast cancer ([place])   . Elevated blood pressure, situational    PT STATES HER LAST COUPLE OF MD APPOINTMENTS SHE HAS HAD ELEVATED BP-NEVER HAD A PROBLEM WITH THIS PREVIOUSLY  . Genetic testing 09/05/2017   Breast/GYN panel (23 genes) @ [facility] - No pathogenic mutations detected  . GERD (gastroesophageal reflux disease)    OCC- NO MEDS  . Personal history of chemotherapy     SURGICAL HISTORY: Past Surgical History:  Procedure Laterality Date  . BREAST BIOPSY Right 06/20/2017   Invasive Mammary Carcinoma  . NO PAST SURGERIES    . PORTACATH PLACEMENT N/A 07/11/2017   Procedure: INSERTION PORT-A-CATH;  Surgeon: Leonie Green, MD;  Location: [facility] ORS;  Service: General;  Laterality: N/A;    SOCIAL HISTORY: Social History   Socioeconomic History  . Marital status: Married    Spouse name: Not on file  . Number of  children: Not on file  . Years of education: Not on file  . Highest education level: Not on file  Social Needs  . Financial resource strain: Not on file  . Food insecurity - worry: Not on file  . Food insecurity - inability: Not on file  . Transportation needs - medical: Not on file  . Transportation needs - non-medical: Not on file  Occupational History  . Not on file  Tobacco Use  . Smoking status: Never Smoker  . Smokeless tobacco: Never Used  Substance and Sexual Activity  . Alcohol use: No    Alcohol/week: 0.0 oz  . Drug use: No  . Sexual activity: Yes  Other Topics Concern  . Not on file  Social History Narrative  . Not on file    FAMILY HISTORY: Family History  Problem Relation Age of Onset  . Breast cancer Maternal Aunt 24       currently late 85s  . Breast cancer Maternal Grandmother 68       second breast vs. recurrence ca at 55; deceased at 71  . Colon cancer Maternal Uncle 90       deceased in 83s  . Breast cancer Paternal Aunt        age at dx unknown; currently in 68s  . Breast cancer Paternal Grandmother        age at dx unknown  . Melanoma Mother        on leg  . Testicular cancer Maternal Uncle 11       currently 54s    ALLERGIES:  is allergic to codeine.  MEDICATIONS:  Current Outpatient Medications  Medication Sig Dispense Refill  . acetaminophen (TYLENOL) 325 MG tablet Take 650 mg by mouth every 6 (six) hours as needed (for pain.).     Marland Kitchen chlorhexidine (PERIDEX) 0.12 % solution Use as directed 15 mLs in the mouth or throat 2 (two) times daily. 473 mL 0  . HYDROcodone-acetaminophen (NORCO) 5-325 MG tablet Take 1-2 tablets by mouth every 6 (six) hours as needed for moderate pain. 6 tablet 0  . ibuprofen (ADVIL,MOTRIN) 200 MG tablet Take 400 mg by mouth every 8 (eight) hours as needed (for pain.).    Marland Kitchen lidocaine-prilocaine (EMLA) cream Apply to affected area once 30 g 3  . loratadine (CLARITIN) 10 MG tablet Take 10 mg by mouth daily. One tablet 3  days after treatment    . Multiple Vitamin (MULTI-VITAMINS) TABS Take by mouth.    . Multiple Vitamin (MULTIVITAMIN WITH MINERALS) TABS tablet Take 1 tablet by mouth daily.    . ondansetron (ZOFRAN) 8 MG tablet Take 1 tablet (8 mg total) by mouth 2 (two) times daily as needed. Start on the third day after chemotherapy. 30 tablet 1  . prochlorperazine (COMPAZINE) 10 MG tablet Take 1 tablet (10 mg total) by mouth every 6 (six) hours as needed (Nausea or vomiting). 30 tablet 1   No current facility-administered medications for this visit.    Facility-Administered Medications Ordered in Other Visits  Medication Dose Route Frequency Provider Last Rate Last Dose  . dexamethasone (DECADRON) 20 mg in sodium chloride 0.9 % 50 mL IVPB  20 mg Intravenous Once [name], MD   20 mg at 09/19/17 1110  . heparin lock flush 100 unit/mL  500 Units Intravenous  Once [name], MD      . heparin lock flush 100 unit/mL  500 Units Intravenous Once [name], MD      . PACLitaxel (TAXOL) 150 mg in sodium chloride 0.9 % 250 mL chemo infusion (</= 26m/m2)  80 mg/m2 (Treatment Plan Recorded) Intravenous Once YEarlie Server MD      . sodium chloride flush (NS) 0.9 % injection 10 mL  10 mL Intravenous PRN YEarlie Server MD   10 mL at 08/01/17 0829     PHYSICAL EXAMINATION: ECOG PERFORMANCE STATUS: 0 - Asymptomatic Vitals:   09/19/17 0935  BP: 134/82  Pulse: (!) 107  Temp: 98.7 F (37.1 C)   Filed Weights   09/19/17 0935  Weight: 187 lb 5 oz (85 kg)    Physical Exam  Constitutional: She is oriented to person, place, and time and well-developed, well-nourished, and in no distress. No distress.  HENT:  Head: Normocephalic and atraumatic.  Nose: Nose normal.  Mouth/Throat: Oropharynx is clear and moist. No oropharyngeal exudate.  Eyes: EOM are normal. Pupils are equal, round, and reactive to light. Right eye exhibits no discharge. Left eye exhibits no discharge. No scleral icterus.  Neck: Normal range of motion. Neck  supple.  Cardiovascular: Normal rate, regular rhythm, normal heart sounds and intact distal pulses.  No murmur heard. Pulmonary/Chest: Effort normal and breath sounds normal. No stridor. No respiratory distress. She has no wheezes. She has no rales.  Abdominal: Soft. Bowel sounds are normal. She exhibits no distension and no mass. There is no tenderness. There is no rebound and no guarding.  Musculoskeletal: Normal range of motion. She exhibits no edema or deformity.  Lymphadenopathy:    She has no cervical adenopathy.  Neurological: She is alert and oriented to person, place, and time. No cranial nerve deficit. Coordination normal.  Skin: Skin is warm and dry. No rash noted. She is not diaphoretic. No erythema. No pallor.  Psychiatric: Mood, memory, affect and judgment normal.  Breast exam was performed in seated and lying down position. No nipple discharge. Right breast 10'oclock palpable mobile mass decreased in size. No evidence of right axillary adenopathy No evidence of any palpable mass on left breast. No evidence of left axillary adenopathy.      Lab Results  Component Value Date   WBC 4.4 09/19/2017   HGB 11.7 (L) 09/19/2017   HCT 33.7 (L) 09/19/2017   MCV 87.8 09/19/2017   PLT 328 09/19/2017   Recent Labs    08/29/17 0810 09/12/17 0830 09/19/17 0916  NA 137 137 137  K 3.8 3.9 3.9  CL 104 102 103  CO2 '22 26 27  ' GLUCOSE 106* 96 122*  BUN '13 11 11  ' CREATININE 0.45 0.53 0.48  CALCIUM 8.9 9.2 8.9  GFRNONAA >60 >60 >60  GFRAA >60 >60 >60  PROT 7.0 7.3 6.7  ALBUMIN 4.2 4.1 3.9  AST 23 27 41  ALT 15 18 39  ALKPHOS 83 86 57  BILITOT 0.4 0.3 0.4    2D Echo was done which showed LVEF 55-65%, mild mitral valve regurgitation.    ASSESSMENT & PLAN:  1. Malignant neoplasm of upper-outer quadrant of right breast in [place], estrogen receptor positive [place]   2. Encounter for antineoplastic chemotherapy    cT2N0M0, grade 2 Patient tolerated 4 cycles of dd AC counts  are stable.  Today urine pregnancy test is negative. Okay to proceed with cycle 2 Taxol today..  CBC CMP prior to next cycle of chemotherapy.  #  Genetic testing negative. Discussed with patient.  # Patient's surgeon Dr.Smith is retiring this month. Will need to establish care with another surgeon for upcoming surgery after   #  All questions were answered. The patient knows to call the clinic with any problems questions or concerns.  Return visit: 1 week for cycle 2 taxol.  [name], MD, PhD Hematology Oncology [facility] Pager- [phone] 09/19/2017

## 2017-09-26 ENCOUNTER — Inpatient Hospital Stay: Payer: 59

## 2017-09-26 ENCOUNTER — Encounter: Payer: Self-pay | Admitting: Oncology

## 2017-09-26 ENCOUNTER — Inpatient Hospital Stay (HOSPITAL_BASED_OUTPATIENT_CLINIC_OR_DEPARTMENT_OTHER): Payer: 59 | Admitting: Oncology

## 2017-09-26 ENCOUNTER — Other Ambulatory Visit: Payer: Self-pay

## 2017-09-26 VITALS — BP 135/83 | HR 108 | Temp 97.4°F | Resp 18 | Wt 186.9 lb

## 2017-09-26 DIAGNOSIS — Z170 Estrogen receptor positive status [ER+]: Secondary | ICD-10-CM

## 2017-09-26 DIAGNOSIS — C50411 Malignant neoplasm of upper-outer quadrant of right female breast: Secondary | ICD-10-CM

## 2017-09-26 DIAGNOSIS — C50412 Malignant neoplasm of upper-outer quadrant of left female breast: Principal | ICD-10-CM

## 2017-09-26 DIAGNOSIS — Z79899 Other long term (current) drug therapy: Secondary | ICD-10-CM

## 2017-09-26 DIAGNOSIS — Z5111 Encounter for antineoplastic chemotherapy: Secondary | ICD-10-CM | POA: Diagnosis not present

## 2017-09-26 LAB — CBC WITH DIFFERENTIAL/PLATELET
Basophils Absolute: 0.1 10*3/uL (ref 0–0.1)
Basophils Relative: 1 %
EOS ABS: 0.1 10*3/uL (ref 0–0.7)
Eosinophils Relative: 1 %
HEMATOCRIT: 34.5 % — AB (ref 35.0–47.0)
HEMOGLOBIN: 12.1 g/dL (ref 12.0–16.0)
LYMPHS ABS: 1.2 10*3/uL (ref 1.0–3.6)
Lymphocytes Relative: 21 %
MCH: 30.9 pg (ref 26.0–34.0)
MCHC: 35 g/dL (ref 32.0–36.0)
MCV: 88.2 fL (ref 80.0–100.0)
MONO ABS: 0.4 10*3/uL (ref 0.2–0.9)
Monocytes Relative: 8 %
NEUTROS PCT: 69 %
Neutro Abs: 4 10*3/uL (ref 1.4–6.5)
Platelets: 394 10*3/uL (ref 150–440)
RBC: 3.91 MIL/uL (ref 3.80–5.20)
RDW: 15.9 % — AB (ref 11.5–14.5)
WBC: 5.7 10*3/uL (ref 3.6–11.0)

## 2017-09-26 LAB — COMPREHENSIVE METABOLIC PANEL
ALBUMIN: 3.8 g/dL (ref 3.5–5.0)
ALK PHOS: 48 U/L (ref 38–126)
ALT: 55 U/L — AB (ref 14–54)
AST: 47 U/L — AB (ref 15–41)
Anion gap: 8 (ref 5–15)
BUN: 10 mg/dL (ref 6–20)
CHLORIDE: 103 mmol/L (ref 101–111)
CO2: 25 mmol/L (ref 22–32)
CREATININE: 0.48 mg/dL (ref 0.44–1.00)
Calcium: 9.1 mg/dL (ref 8.9–10.3)
GFR calc non Af Amer: >60 mL/min (ref >60)
GLUCOSE: 113 mg/dL — AB (ref 65–99)
Potassium: 3.8 mmol/L (ref 3.5–5.1)
Sodium: 136 mmol/L (ref 135–145)
Total Bilirubin: 0.6 mg/dL (ref 0.3–1.2)
Total Protein: 7.1 g/dL (ref 6.5–8.1)

## 2017-09-26 LAB — PREGNANCY, URINE: PREG TEST UR: NEGATIVE

## 2017-09-26 MED ORDER — SODIUM CHLORIDE 0.9 % IV SOLN
80.0000 mg/m2 | Freq: Once | INTRAVENOUS | Status: AC
  Administered 2017-09-26: 150 mg via INTRAVENOUS
  Filled 2017-09-26: qty 25

## 2017-09-26 MED ORDER — DIPHENHYDRAMINE HCL 50 MG/ML IJ SOLN
50.0000 mg | Freq: Once | INTRAMUSCULAR | Status: AC
  Administered 2017-09-26: 50 mg via INTRAVENOUS
  Filled 2017-09-26: qty 1

## 2017-09-26 MED ORDER — HEPARIN SOD (PORK) LOCK FLUSH 100 UNIT/ML IV SOLN
500.0000 [IU] | Freq: Once | INTRAVENOUS | Status: AC
  Administered 2017-09-26: 500 [IU] via INTRAVENOUS
  Filled 2017-09-26: qty 5

## 2017-09-26 MED ORDER — SODIUM CHLORIDE 0.9% FLUSH
10.0000 mL | INTRAVENOUS | Status: DC | PRN
  Administered 2017-09-26: 10 mL via INTRAVENOUS
  Filled 2017-09-26: qty 10

## 2017-09-26 MED ORDER — SODIUM CHLORIDE 0.9 % IV SOLN
20.0000 mg | Freq: Once | INTRAVENOUS | Status: AC
  Administered 2017-09-26: 20 mg via INTRAVENOUS
  Filled 2017-09-26: qty 2

## 2017-09-26 MED ORDER — PACLITAXEL CHEMO INJECTION 300 MG/50ML: 80.0000 mg/m2 | Freq: Once | INTRAVENOUS | Status: DC

## 2017-09-26 MED ORDER — FAMOTIDINE IN NACL 20-0.9 MG/50ML-% IV SOLN: 20.0000 mg | Freq: Once | INTRAVENOUS | Status: DC

## 2017-09-26 MED ORDER — SODIUM CHLORIDE 0.9 % IV SOLN
Freq: Once | INTRAVENOUS | Status: AC
  Administered 2017-09-26: 10:00:00 via INTRAVENOUS
  Filled 2017-09-26: qty 1000

## 2017-09-26 MED ORDER — SODIUM CHLORIDE 0.9 % IV SOLN
INTRAVENOUS | Status: DC
  Administered 2017-09-26: 11:00:00 via INTRAVENOUS
  Filled 2017-09-26 (×2): qty 100

## 2017-09-26 NOTE — Progress Notes (Signed)
Here for follow up.stated "doing fine " no voiced c/o

## 2017-09-26 NOTE — Progress Notes (Signed)
Hematology/Oncology Follow up note [facility] Telephone:(336) [phone] Fax:([phone]   Patient Care Team: [name], NP as PCP - General (Nurse Practitioner)  REFERRING PROVIDER: [name], NP   REASON FOR VISIT on chemotherapy management of breast cancer   HISTORY OF PRESENTING ILLNESS:  Anne Wu is a  37 y.o.  female with diagnosis of cT2N0M0, grade 2 invasive mammary carcinoma.  She has significant family history on both maternal side as well as paternal side. She felt a right breast mass 2 months ago and diagnostic mammogram showed right upper quandrant 2.2 cm mass, which was confirmed on [place] and biopsied. Right axillary no clinically suspicious lymph node. Pathology showed invasive mammary carcinoma, ER/PR positive,, HER2 IHC  equivocal, FISH negative.   # Patient was evaluated by Dr.Smith. From surgical standpoint, Dr.Smith recommend neoadjuvant chemotherapy to facilitate surgery to obtain negative margin and better surgical outcome. Also given patient's young age, neoadjuvant chemotherapy is reasonable.    #Patient stated that she does not desire future fertility.  # 2D Echo showed normal systolic function, mild mitral regurgitation.   # Testing did not reveal any pathogenic mutation in any of these genes. A copy of the genetic test report will be scanned into Epic under the Media tab.The genes analyzed were the 23 genes on [facility]'s Breast/GYN panel (ATM, BARD1, BRCA1, BRCA2, BRIP1, CDH1, CHEK2, DICER1, EPCAM, MLH1,  MSH2, MSH6, NBN, NF1, PALB2, PMS2, PTEN, RAD50, RAD51C, RAD51D,SMARCA4, STK11, and TP53).  09/08/2017 Interval mammogram after 4 cycle of AC, showed good treatment response. The biopsy-proven malignancy in the upper outer quadrant of the right breast at posterior depth, associated with scattered microcalcifications and architectural distortion, has significantly decreased in size since the mammogram 06/20/2017. On today's  mammogram, the mass measures approximately 1.7 x 2.2 x 1.2 cm (previously 2.4 x 1.6 x 2.7 cm).   INTERVAL HISTORY Anne Wu is a 37 y.o. female who has above history reviewed by me today presents for evaluation prior to cycle 3  neoadjuvant Taxol for treatment of breast cancer (cT2cN0cM0).  She reports feeling well.  Denies any nausea vomiting, abdominal pain, fatigue, numbness or tingling.   Review of Systems  Constitutional: Negative.  Negative for chills and fever.  HENT: Negative for congestion and hearing loss.   Eyes: Negative.   Respiratory: Negative.  Negative for cough and hemoptysis.   Cardiovascular: Negative.  Negative for chest pain, palpitations and orthopnea.  Gastrointestinal: Negative.  Negative for abdominal pain, nausea and vomiting.  Genitourinary: Negative.   Musculoskeletal: Negative.  Negative for back pain, myalgias and neck pain.  Skin: Negative for rash.  Neurological: Negative for dizziness, tingling, sensory change and weakness.  Endo/Heme/Allergies: Does not bruise/bleed easily.  Psychiatric/Behavioral: Negative for depression.    MEDICAL HISTORY:  Past Medical History:  Diagnosis Date  . Asthma    AS A CHILD-NO INHALERS  . Breast cancer ([place])   . Elevated blood pressure, situational    PT STATES HER LAST COUPLE OF MD APPOINTMENTS SHE HAS HAD ELEVATED BP-NEVER HAD A PROBLEM WITH THIS PREVIOUSLY  . Genetic testing 09/05/2017   Breast/GYN panel (23 genes) @ [facility] - No pathogenic mutations detected  . GERD (gastroesophageal reflux disease)    OCC- NO MEDS  . Personal history of chemotherapy     SURGICAL HISTORY: Past Surgical History:  Procedure Laterality Date  . BREAST BIOPSY Right 06/20/2017   Invasive Mammary Carcinoma  . NO PAST SURGERIES    . PORTACATH PLACEMENT N/A 07/11/2017  Procedure: INSERTION PORT-A-CATH;  Surgeon: Leonie Green, MD;  Location: [facility] ORS;  Service: General;  Laterality: N/A;    SOCIAL HISTORY: Social  History   Socioeconomic History  . Marital status: Married    Spouse name: Not on file  . Number of children: Not on file  . Years of education: Not on file  . Highest education level: Not on file  Social Needs  . Financial resource strain: Not on file  . Food insecurity - worry: Not on file  . Food insecurity - inability: Not on file  . Transportation needs - medical: Not on file  . Transportation needs - non-medical: Not on file  Occupational History  . Not on file  Tobacco Use  . Smoking status: Never Smoker  . Smokeless tobacco: Never Used  Substance and Sexual Activity  . Alcohol use: No    Alcohol/week: 0.0 oz  . Drug use: No  . Sexual activity: Yes  Other Topics Concern  . Not on file  Social History Narrative  . Not on file    FAMILY HISTORY: Family History  Problem Relation Age of Onset  . Breast cancer Maternal Aunt 101       currently late 29s  . Breast cancer Maternal Grandmother 68       second breast vs. recurrence ca at 25; deceased at 32  . Colon cancer Maternal Uncle 76       deceased in 77s  . Breast cancer Paternal Aunt        age at dx unknown; currently in 51s  . Breast cancer Paternal Grandmother        age at dx unknown  . Melanoma Mother        on leg  . Testicular cancer Maternal Uncle 50       currently 90s    ALLERGIES:  is allergic to codeine.  MEDICATIONS:  Current Outpatient Medications  Medication Sig Dispense Refill  . lidocaine-prilocaine (EMLA) cream Apply to affected area once 30 g 3  . Multiple Vitamin (MULTI-VITAMINS) TABS Take by mouth.    Marland Kitchen acetaminophen (TYLENOL) 325 MG tablet Take 650 mg by mouth every 6 (six) hours as needed (for pain.).     Marland Kitchen chlorhexidine (PERIDEX) 0.12 % solution Use as directed 15 mLs in the mouth or throat 2 (two) times daily. (Patient not taking: Reported on 09/26/2017) 473 mL 0  . HYDROcodone-acetaminophen (NORCO) 5-325 MG tablet Take 1-2 tablets by mouth every 6 (six) hours as needed for  moderate pain. (Patient not taking: Reported on 09/26/2017) 6 tablet 0  . ibuprofen (ADVIL,MOTRIN) 200 MG tablet Take 400 mg by mouth every 8 (eight) hours as needed (for pain.).    Marland Kitchen loratadine (CLARITIN) 10 MG tablet Take 10 mg by mouth daily. One tablet 3 days after treatment    . ondansetron (ZOFRAN) 8 MG tablet Take 1 tablet (8 mg total) by mouth 2 (two) times daily as needed. Start on the third day after chemotherapy. (Patient not taking: Reported on 09/26/2017) 30 tablet 1  . prochlorperazine (COMPAZINE) 10 MG tablet Take 1 tablet (10 mg total) by mouth every 6 (six) hours as needed (Nausea or vomiting). (Patient not taking: Reported on 09/26/2017) 30 tablet 1   No current facility-administered medications for this visit.    Facility-Administered Medications Ordered in Other Visits  Medication Dose Route Frequency Provider Last Rate Last Dose  . heparin lock flush 100 unit/mL  500 Units Intravenous Once [name], MD      .  sodium chloride flush (NS) 0.9 % injection 10 mL  10 mL Intravenous PRN [name], MD   10 mL at 08/01/17 0829     PHYSICAL EXAMINATION: ECOG PERFORMANCE STATUS: 0 - Asymptomatic Vitals:   09/26/17 0922  BP: 135/83  Pulse: (!) 108  Resp: 18  Temp: (!) 97.4 F (36.3 C)   Filed Weights   09/26/17 0922  Weight: 186 lb 14.4 oz (84.8 kg)    Physical Exam  Constitutional: She is oriented to person, place, and time and well-developed, well-nourished, and in no distress. No distress.  HENT:  Head: Normocephalic and atraumatic.  Mouth/Throat: Oropharynx is clear and moist. No oropharyngeal exudate.  Eyes: EOM are normal. Pupils are equal, round, and reactive to light. Right eye exhibits no discharge. No scleral icterus.  Neck: Normal range of motion. Neck supple.  Cardiovascular: Normal rate, regular rhythm, normal heart sounds and intact distal pulses.  No murmur heard. Pulmonary/Chest: Effort normal and breath sounds normal. No respiratory distress. She has no  rales.  Abdominal: Soft. Bowel sounds are normal. She exhibits no distension. There is no rebound.  Musculoskeletal: Normal range of motion. She exhibits no edema or deformity.  Lymphadenopathy:    She has no cervical adenopathy.  Neurological: She is alert and oriented to person, place, and time. Coordination normal.  Skin: Skin is warm and dry. No rash noted. She is not diaphoretic.  Psychiatric: Mood, memory, affect and judgment normal.       Lab Results  Component Value Date   WBC 5.7 09/26/2017   HGB 12.1 09/26/2017   HCT 34.5 (L) 09/26/2017   MCV 88.2 09/26/2017   PLT 394 09/26/2017   Recent Labs    09/12/17 0830 09/19/17 0916 09/26/17 0910  NA 137 137 136  K 3.9 3.9 3.8  CL 102 103 103  CO2 '26 27 25  ' GLUCOSE 96 122* 113*  BUN '11 11 10  ' CREATININE 0.53 0.48 0.48  CALCIUM 9.2 8.9 9.1  GFRNONAA >60 >60 >60  GFRAA >60 >60 >60  PROT 7.3 6.7 7.1  ALBUMIN 4.1 3.9 3.8  AST 27 41 47*  ALT 18 39 55*  ALKPHOS 86 57 48  BILITOT 0.3 0.4 0.6    2D Echo was done which showed LVEF 55-65%, mild mitral valve regurgitation.    ASSESSMENT & PLAN:  1. Malignant neoplasm of upper-outer quadrant of right breast in female, estrogen receptor positive ([facility])   2. Encounter for antineoplastic chemotherapy    cT2N0M0, grade 2 Patient tolerated 4 cycles of dd AC counts are stable.  Today urine pregnancy test is negative.  She tolerates weekly Taxol really well. Okay to proceed with cycle 3 Taxol today..  CBC CMP prior to next cycle of chemotherapy. # Patient's surgeon Dr.Smith is retiring this month. She will establish care with Dr.Cintron for upcoming surgery after chemotherapy. Dr.Cintron aware about the case.   #  All questions were answered. The patient knows to call the clinic with any problems questions or concerns.  Return visit: 1 week for cycle 4 taxol.  [name], MD, PhD Hematology Oncology [facility] at [facility] Pager- [phone] 09/26/2017

## 2017-10-03 ENCOUNTER — Encounter: Payer: Self-pay | Admitting: Oncology

## 2017-10-03 ENCOUNTER — Inpatient Hospital Stay: Payer: 59

## 2017-10-03 ENCOUNTER — Other Ambulatory Visit: Payer: Self-pay

## 2017-10-03 ENCOUNTER — Inpatient Hospital Stay (HOSPITAL_BASED_OUTPATIENT_CLINIC_OR_DEPARTMENT_OTHER): Payer: 59 | Admitting: Oncology

## 2017-10-03 VITALS — BP 143/90 | HR 96 | Temp 97.6°F | Resp 18 | Wt 188.8 lb

## 2017-10-03 DIAGNOSIS — C50411 Malignant neoplasm of upper-outer quadrant of right female breast: Secondary | ICD-10-CM | POA: Diagnosis not present

## 2017-10-03 DIAGNOSIS — Z170 Estrogen receptor positive status [ER+]: Secondary | ICD-10-CM

## 2017-10-03 DIAGNOSIS — Z5111 Encounter for antineoplastic chemotherapy: Secondary | ICD-10-CM

## 2017-10-03 LAB — COMPREHENSIVE METABOLIC PANEL
ALBUMIN: 3.8 g/dL (ref 3.5–5.0)
ALT: 29 U/L (ref 14–54)
AST: 30 U/L (ref 15–41)
Alkaline Phosphatase: 62 U/L (ref 38–126)
Anion gap: 9 (ref 5–15)
BUN: 15 mg/dL (ref 6–20)
CHLORIDE: 102 mmol/L (ref 101–111)
CO2: 24 mmol/L (ref 22–32)
Calcium: 9.1 mg/dL (ref 8.9–10.3)
Creatinine, Ser: 0.52 mg/dL (ref 0.44–1.00)
Glucose, Bld: 118 mg/dL — ABNORMAL HIGH (ref 65–99)
POTASSIUM: 3.9 mmol/L (ref 3.5–5.1)
SODIUM: 135 mmol/L (ref 135–145)
Total Bilirubin: 0.5 mg/dL (ref 0.3–1.2)
Total Protein: 7 g/dL (ref 6.5–8.1)

## 2017-10-03 LAB — CBC WITH DIFFERENTIAL/PLATELET
BASOS PCT: 1 %
Basophils Absolute: 0.1 10*3/uL (ref 0–0.1)
EOS PCT: 2 %
Eosinophils Absolute: 0.1 10*3/uL (ref 0–0.7)
HEMATOCRIT: 33.9 % — AB (ref 35.0–47.0)
Hemoglobin: 12 g/dL (ref 12.0–16.0)
Lymphocytes Relative: 20 %
Lymphs Abs: 1.1 10*3/uL (ref 1.0–3.6)
MCH: 31.5 pg (ref 26.0–34.0)
MCHC: 35.5 g/dL (ref 32.0–36.0)
MCV: 88.6 fL (ref 80.0–100.0)
MONO ABS: 0.5 10*3/uL (ref 0.2–0.9)
MONOS PCT: 8 %
NEUTROS ABS: 3.9 10*3/uL (ref 1.4–6.5)
Neutrophils Relative %: 69 %
Platelets: 314 10*3/uL (ref 150–440)
RBC: 3.82 MIL/uL (ref 3.80–5.20)
RDW: 15.9 % — AB (ref 11.5–14.5)
WBC: 5.7 10*3/uL (ref 3.6–11.0)

## 2017-10-03 LAB — PREGNANCY, URINE: Preg Test, Ur: NEGATIVE

## 2017-10-03 MED ORDER — SODIUM CHLORIDE 0.9 % IV SOLN
Freq: Once | INTRAVENOUS | Status: AC
  Administered 2017-10-03: 09:00:00 via INTRAVENOUS
  Filled 2017-10-03: qty 1000

## 2017-10-03 MED ORDER — SODIUM CHLORIDE 0.9 % IV SOLN
80.0000 mg/m2 | Freq: Once | INTRAVENOUS | Status: AC
  Administered 2017-10-03: 150 mg via INTRAVENOUS
  Filled 2017-10-03: qty 25

## 2017-10-03 MED ORDER — SODIUM CHLORIDE 0.9 % IV SOLN
20.0000 mg | Freq: Once | INTRAVENOUS | Status: AC
  Administered 2017-10-03: 20 mg via INTRAVENOUS
  Filled 2017-10-03: qty 2

## 2017-10-03 MED ORDER — SODIUM CHLORIDE 0.9% FLUSH
10.0000 mL | INTRAVENOUS | Status: DC | PRN
  Administered 2017-10-03: 10 mL
  Filled 2017-10-03: qty 10

## 2017-10-03 MED ORDER — SODIUM CHLORIDE 0.9 % IV SOLN
INTRAVENOUS | Status: DC
  Administered 2017-10-03: 10:00:00 via INTRAVENOUS
  Filled 2017-10-03 (×2): qty 100

## 2017-10-03 MED ORDER — HEPARIN SOD (PORK) LOCK FLUSH 100 UNIT/ML IV SOLN
500.0000 [IU] | Freq: Once | INTRAVENOUS | Status: AC | PRN
  Administered 2017-10-03: 500 [IU]
  Filled 2017-10-03: qty 5

## 2017-10-03 MED ORDER — FAMOTIDINE IN NACL 20-0.9 MG/50ML-% IV SOLN: 20.0000 mg | Freq: Once | INTRAVENOUS | Status: DC

## 2017-10-03 MED ORDER — DIPHENHYDRAMINE HCL 50 MG/ML IJ SOLN
50.0000 mg | Freq: Once | INTRAMUSCULAR | Status: AC
  Administered 2017-10-03: 50 mg via INTRAVENOUS
  Filled 2017-10-03: qty 1

## 2017-10-03 NOTE — Progress Notes (Signed)
Hematology/Oncology Follow up note [facility] Telephone:(336) [phone] Fax:(336) [phone]   Patient Care Team: [name], NP as PCP - General (Nurse Practitioner)  REFERRING PROVIDER: [name], NP   REASON FOR VISIT on chemotherapy management of breast cancer   HISTORY OF PRESENTING ILLNESS:  Anne Wu is a  37 y.o.  female with diagnosis of cT2N0M0, grade 2 invasive mammary carcinoma.  She has significant family history on both maternal side as well as paternal side. She felt a right breast mass 2 months ago and diagnostic mammogram showed right upper quandrant 2.2 cm mass, which was confirmed on [place] and biopsied. Right axillary no clinically suspicious lymph node. Pathology showed invasive mammary carcinoma, ER/PR positive,, HER2 IHC  equivocal, FISH negative.   # Patient was evaluated by Dr.Smith. From surgical standpoint, Dr.Smith recommend neoadjuvant chemotherapy to facilitate surgery to obtain negative margin and better surgical outcome. Also given patient's young age, neoadjuvant chemotherapy is reasonable.    #Patient stated that she does not desire future fertility.  # 2D Echo showed normal systolic function, mild mitral regurgitation.   # Testing did not reveal any pathogenic mutation in any of these genes. A copy of the genetic test report will be scanned into Epic under the Media tab.The genes analyzed were the 23 genes on [facility]'s Breast/GYN panel (ATM, BARD1, BRCA1, BRCA2, BRIP1, CDH1, CHEK2, DICER1, EPCAM, MLH1,  MSH2, MSH6, NBN, NF1, PALB2, PMS2, PTEN, RAD50, RAD51C, RAD51D,SMARCA4, STK11, and TP53).  09/08/2017 Interval mammogram after 4 cycle of AC, showed good treatment response. The biopsy-proven malignancy in the upper outer quadrant of the right breast at posterior depth, associated with scattered microcalcifications and architectural distortion, has significantly decreased in size since the mammogram 06/20/2017. On today's  mammogram, the mass measures approximately 1.7 x 2.2 x 1.2 cm (previously 2.4 x 1.6 x 2.7 cm).   INTERVAL HISTORY Anne Wu is a 37 y.o. female who has above history reviewed by me today presents for evaluation prior to cycle 4 neoadjuvant Taxol for treatment of breast cancer (cT2cN0cM0).  She reports doing well without any complaints.  Denies any nausea vomiting, abdominal pain, fatigue, numbness or tingling.   Review of Systems  Constitutional: Negative.  Negative for chills and fever.  HENT: Negative for congestion, ear discharge and hearing loss.   Eyes: Negative for photophobia and pain.  Respiratory: Negative.  Negative for cough and hemoptysis.   Cardiovascular: Negative for chest pain, palpitations and orthopnea.  Gastrointestinal: Negative for abdominal pain, nausea and vomiting.  Musculoskeletal: Negative for back pain, myalgias and neck pain.  Skin: Negative for rash.  Neurological: Negative for dizziness, tingling, tremors, sensory change and weakness.  Endo/Heme/Allergies: Negative for environmental allergies. Does not bruise/bleed easily.  Psychiatric/Behavioral: Negative for depression and substance abuse.    MEDICAL HISTORY:  Past Medical History:  Diagnosis Date  . Asthma    AS A CHILD-NO INHALERS  . Breast cancer ([place])   . Elevated blood pressure, situational    PT STATES HER LAST COUPLE OF MD APPOINTMENTS SHE HAS HAD ELEVATED BP-NEVER HAD A PROBLEM WITH THIS PREVIOUSLY  . Genetic testing 09/05/2017   Breast/GYN panel (23 genes) @ [facility] - No pathogenic mutations detected  . GERD (gastroesophageal reflux disease)    OCC- NO MEDS  . Personal history of chemotherapy     SURGICAL HISTORY: Past Surgical History:  Procedure Laterality Date  . BREAST BIOPSY Right 06/20/2017   Invasive Mammary Carcinoma  . NO PAST SURGERIES    .  PORTACATH PLACEMENT N/A 07/11/2017   Procedure: INSERTION PORT-A-CATH;  Surgeon: Leonie Green, MD;  Location: [facility] ORS;   Service: General;  Laterality: N/A;    SOCIAL HISTORY: Social History   Socioeconomic History  . Marital status: Married    Spouse name: Not on file  . Number of children: Not on file  . Years of education: Not on file  . Highest education level: Not on file  Occupational History  . Not on file  Social Needs  . Financial resource strain: Not on file  . Food insecurity:    Worry: Not on file    Inability: Not on file  . Transportation needs:    Medical: Not on file    Non-medical: Not on file  Tobacco Use  . Smoking status: Never Smoker  . Smokeless tobacco: Never Used  Substance and Sexual Activity  . Alcohol use: No    Alcohol/week: 0.0 oz  . Drug use: No  . Sexual activity: Yes  Lifestyle  . Physical activity:    Days per week: Not on file    Minutes per session: Not on file  . Stress: Not on file  Relationships  . Social connections:    Talks on phone: Not on file    Gets together: Not on file    Attends religious service: Not on file    Active member of club or organization: Not on file    Attends meetings of clubs or organizations: Not on file    Relationship status: Not on file  . Intimate partner violence:    Fear of current or ex partner: Not on file    Emotionally abused: Not on file    Physically abused: Not on file    Forced sexual activity: Not on file  Other Topics Concern  . Not on file  Social History Narrative  . Not on file    FAMILY HISTORY: Family History  Problem Relation Age of Onset  . Breast cancer Maternal Aunt 60       currently late 33s  . Breast cancer Maternal Grandmother 68       second breast vs. recurrence ca at 70; deceased at 7  . Colon cancer Maternal Uncle 29       deceased in 32s  . Breast cancer Paternal Aunt        age at dx unknown; currently in 67s  . Breast cancer Paternal Grandmother        age at dx unknown  . Melanoma Mother        on leg  . Testicular cancer Maternal Uncle 60       currently 52s     ALLERGIES:  is allergic to codeine.  MEDICATIONS:  Current Outpatient Medications  Medication Sig Dispense Refill  . lidocaine-prilocaine (EMLA) cream Apply to affected area once 30 g 3  . Multiple Vitamin (MULTI-VITAMINS) TABS Take by mouth.    . ondansetron (ZOFRAN) 8 MG tablet Take 1 tablet (8 mg total) by mouth 2 (two) times daily as needed. Start on the third day after chemotherapy. 30 tablet 1  . acetaminophen (TYLENOL) 325 MG tablet Take 650 mg by mouth every 6 (six) hours as needed (for pain.).     Marland Kitchen chlorhexidine (PERIDEX) 0.12 % solution Use as directed 15 mLs in the mouth or throat 2 (two) times daily. (Patient not taking: Reported on 09/26/2017) 473 mL 0  . HYDROcodone-acetaminophen (NORCO) 5-325 MG tablet Take 1-2 tablets by mouth every 6 (  six) hours as needed for moderate pain. (Patient not taking: Reported on 09/26/2017) 6 tablet 0  . ibuprofen (ADVIL,MOTRIN) 200 MG tablet Take 400 mg by mouth every 8 (eight) hours as needed (for pain.).    Marland Kitchen loratadine (CLARITIN) 10 MG tablet Take 10 mg by mouth daily. One tablet 3 days after treatment    . prochlorperazine (COMPAZINE) 10 MG tablet Take 1 tablet (10 mg total) by mouth every 6 (six) hours as needed (Nausea or vomiting). (Patient not taking: Reported on 09/26/2017) 30 tablet 1   No current facility-administered medications for this visit.    Facility-Administered Medications Ordered in Other Visits  Medication Dose Route Frequency Provider Last Rate Last Dose  . dexamethasone (DECADRON) 20 mg in sodium chloride 0.9 % 50 mL IVPB  20 mg Intravenous Once [name], MD      . diphenhydrAMINE (BENADRYL) injection 50 mg  50 mg Intravenous Once [name], MD      . heparin lock flush 100 unit/mL  500 Units Intravenous Once [name], MD      . heparin lock flush 100 unit/mL  500 Units Intracatheter Once PRN [name], MD      . PACLitaxel (TAXOL) 150 mg in sodium chloride 0.9 % 250 mL chemo infusion (</= 74m/m2)  80 mg/m2 (Treatment  Plan Recorded) Intravenous Once YEarlie Server MD      . sodium chloride 0.9 % 100 mL with famotidine (PEPCID) 20 mg infusion   Intravenous Continuous YEarlie Server MD      . sodium chloride flush (NS) 0.9 % injection 10 mL  10 mL Intravenous PRN YEarlie Server MD   10 mL at 08/01/17 0829  . sodium chloride flush (NS) 0.9 % injection 10 mL  10 mL Intracatheter PRN YEarlie Server MD   10 mL at 10/03/17 0819     PHYSICAL EXAMINATION: ECOG PERFORMANCE STATUS: 0 - Asymptomatic Vitals:   10/03/17 0840  BP: (!) 143/90  Pulse: 96  Resp: 18  Temp: 97.6 F (36.4 C)   Filed Weights   10/03/17 0840  Weight: 188 lb 12.8 oz (85.6 kg)    Physical Exam  Constitutional: She is oriented to person, place, and time and well-developed, well-nourished, and in no distress. No distress.  HENT:  Head: Normocephalic and atraumatic.  Mouth/Throat: Oropharynx is clear and moist. No oropharyngeal exudate.  Eyes: Pupils are equal, round, and reactive to light. EOM are normal. Right eye exhibits no discharge. No scleral icterus.  Neck: Normal range of motion. Neck supple. No JVD present.  Cardiovascular: Normal rate, regular rhythm, normal heart sounds and intact distal pulses.  No murmur heard. Pulmonary/Chest: Effort normal and breath sounds normal. No respiratory distress. She has no rales.  Abdominal: Soft. Bowel sounds are normal. She exhibits no distension. There is no tenderness. There is no rebound.  Musculoskeletal: Normal range of motion. She exhibits no edema or deformity.  Lymphadenopathy:    She has no cervical adenopathy.  Neurological: She is alert and oriented to person, place, and time. Coordination normal.  Skin: Skin is warm and dry. No rash noted. She is not diaphoretic. No erythema.  Psychiatric: Memory, affect and judgment normal.  Breast exam was performed in seated and lying down position. No nipple discharge. Right breast 10'oclock palpable mobile mass barely palpable.No evidence of right axillary  adenopathy No evidence of any palpable mass on left breast. No evidence ofleftaxillary adenopathy.       Lab Results  Component Value Date  WBC 5.7 10/03/2017   HGB 12.0 10/03/2017   HCT 33.9 (L) 10/03/2017   MCV 88.6 10/03/2017   PLT 314 10/03/2017   Recent Labs    09/19/17 0916 09/26/17 0910 10/03/17 0818  NA 137 136 135  K 3.9 3.8 3.9  CL 103 103 102  CO2 _0 GLUCOSE 122* 113* 118*  BUN _1 CREATININE 0.48 0.48 0.52  CALCIUM 8.9 9.1 9.1  GFRNONAA >60 >60 >60  GFRAA >60 >60 >60  PROT 6.7 7.1 7.0  ALBUMIN 3.9 3.8 3.8  AST 41 47* 30  ALT 39 55* 29  ALKPHOS 57 48 62  BILITOT 0.4 0.6 0.5    2D Echo was done which showed LVEF 55-65%, mild mitral valve regurgitation.    ASSESSMENT & PLAN:  1. Encounter for antineoplastic chemotherapy   2. Malignant neoplasm of upper-outer quadrant of right breast in female, estrogen receptor positive ([place])    cT2N0M0, grade 2 Today urine pregnancy test is negative.  She tolerates weekly Taxol really well. Okay to proceed with cycle 4Taxol today..  CBC CMP, urine pregnancy test prior to next cycle of chemotherapy. # Patient's surgeon Dr.Smith is retiring this month. She will establish care with Dr.Cintron for upcoming surgery after chemotherapy. Dr.Cintron aware about hercase.   #  All questions were answered. The patient knows to call the clinic with any problems questions or concerns.  Return visit: 1 week for cycle 4 taxol and 2 weeks to see me prior to cycle 5 taxol treatment.   [name], MD, PhD Hematology Oncology [facility] at [facility] Pager- [phone] 10/03/2017

## 2017-10-03 NOTE — Progress Notes (Signed)
Here for follow up. Doing well overall she stated. 

## 2017-10-10 ENCOUNTER — Inpatient Hospital Stay: Payer: 59

## 2017-10-10 DIAGNOSIS — C50411 Malignant neoplasm of upper-outer quadrant of right female breast: Secondary | ICD-10-CM

## 2017-10-10 DIAGNOSIS — Z170 Estrogen receptor positive status [ER+]: Principal | ICD-10-CM

## 2017-10-10 DIAGNOSIS — Z5111 Encounter for antineoplastic chemotherapy: Secondary | ICD-10-CM | POA: Diagnosis not present

## 2017-10-10 LAB — CBC WITH DIFFERENTIAL/PLATELET
BASOS PCT: 1 %
Basophils Absolute: 0.1 10*3/uL (ref 0–0.1)
EOS ABS: 0.1 10*3/uL (ref 0–0.7)
Eosinophils Relative: 2 %
HCT: 35.2 % (ref 35.0–47.0)
HEMOGLOBIN: 12.2 g/dL (ref 12.0–16.0)
LYMPHS ABS: 1.1 10*3/uL (ref 1.0–3.6)
Lymphocytes Relative: 17 %
MCH: 30.9 pg (ref 26.0–34.0)
MCHC: 34.6 g/dL (ref 32.0–36.0)
MCV: 89.4 fL (ref 80.0–100.0)
MONO ABS: 0.3 10*3/uL (ref 0.2–0.9)
Monocytes Relative: 5 %
NEUTROS PCT: 75 %
Neutro Abs: 4.8 10*3/uL (ref 1.4–6.5)
Platelets: 323 10*3/uL (ref 150–440)
RBC: 3.94 MIL/uL (ref 3.80–5.20)
RDW: 15.5 % — ABNORMAL HIGH (ref 11.5–14.5)
WBC: 6.3 10*3/uL (ref 3.6–11.0)

## 2017-10-10 LAB — COMPREHENSIVE METABOLIC PANEL
ALT: 34 U/L (ref 14–54)
AST: 31 U/L (ref 15–41)
Albumin: 3.8 g/dL (ref 3.5–5.0)
Alkaline Phosphatase: 55 U/L (ref 38–126)
Anion gap: 7 (ref 5–15)
BILIRUBIN TOTAL: 0.7 mg/dL (ref 0.3–1.2)
BUN: 14 mg/dL (ref 6–20)
CO2: 25 mmol/L (ref 22–32)
Calcium: 9.2 mg/dL (ref 8.9–10.3)
Chloride: 102 mmol/L (ref 101–111)
Creatinine, Ser: 0.54 mg/dL (ref 0.44–1.00)
Glucose, Bld: 115 mg/dL — ABNORMAL HIGH (ref 65–99)
POTASSIUM: 4 mmol/L (ref 3.5–5.1)
Sodium: 134 mmol/L — ABNORMAL LOW (ref 135–145)
TOTAL PROTEIN: 7.2 g/dL (ref 6.5–8.1)

## 2017-10-10 LAB — PREGNANCY, URINE: PREG TEST UR: NEGATIVE

## 2017-10-10 MED ORDER — SODIUM CHLORIDE 0.9 % IV SOLN
Freq: Once | INTRAVENOUS | Status: AC
  Administered 2017-10-10: 10:00:00 via INTRAVENOUS
  Filled 2017-10-10: qty 1000

## 2017-10-10 MED ORDER — SODIUM CHLORIDE 0.9 % IV SOLN
80.0000 mg/m2 | Freq: Once | INTRAVENOUS | Status: AC
  Administered 2017-10-10: 150 mg via INTRAVENOUS
  Filled 2017-10-10: qty 25

## 2017-10-10 MED ORDER — DEXAMETHASONE SODIUM PHOSPHATE 100 MG/10ML IJ SOLN
20.0000 mg | Freq: Once | INTRAMUSCULAR | Status: AC
  Administered 2017-10-10: 20 mg via INTRAVENOUS
  Filled 2017-10-10: qty 2

## 2017-10-10 MED ORDER — FAMOTIDINE IN NACL 20-0.9 MG/50ML-% IV SOLN: 20.0000 mg | Freq: Once | INTRAVENOUS | Status: DC

## 2017-10-10 MED ORDER — HEPARIN SOD (PORK) LOCK FLUSH 100 UNIT/ML IV SOLN
500.0000 [IU] | Freq: Once | INTRAVENOUS | Status: AC | PRN
  Administered 2017-10-10: 500 [IU]
  Filled 2017-10-10: qty 5

## 2017-10-10 MED ORDER — DIPHENHYDRAMINE HCL 50 MG/ML IJ SOLN
50.0000 mg | Freq: Once | INTRAMUSCULAR | Status: AC
  Administered 2017-10-10: 50 mg via INTRAVENOUS
  Filled 2017-10-10: qty 1

## 2017-10-10 MED ORDER — SODIUM CHLORIDE 0.9 % IV SOLN
INTRAVENOUS | Status: DC
  Administered 2017-10-10: 11:00:00 via INTRAVENOUS
  Filled 2017-10-10 (×2): qty 100

## 2017-10-17 ENCOUNTER — Encounter: Payer: Self-pay | Admitting: Oncology

## 2017-10-17 ENCOUNTER — Inpatient Hospital Stay: Payer: 59 | Attending: Oncology

## 2017-10-17 ENCOUNTER — Inpatient Hospital Stay (HOSPITAL_BASED_OUTPATIENT_CLINIC_OR_DEPARTMENT_OTHER): Payer: 59 | Admitting: Oncology

## 2017-10-17 ENCOUNTER — Inpatient Hospital Stay: Payer: 59

## 2017-10-17 ENCOUNTER — Other Ambulatory Visit: Payer: Self-pay

## 2017-10-17 VITALS — BP 146/86 | HR 98 | Temp 97.2°F | Resp 18 | Wt 189.7 lb

## 2017-10-17 VITALS — BP 126/85 | HR 106 | Resp 20

## 2017-10-17 DIAGNOSIS — C50411 Malignant neoplasm of upper-outer quadrant of right female breast: Secondary | ICD-10-CM | POA: Diagnosis not present

## 2017-10-17 DIAGNOSIS — Z170 Estrogen receptor positive status [ER+]: Secondary | ICD-10-CM

## 2017-10-17 DIAGNOSIS — Z79899 Other long term (current) drug therapy: Secondary | ICD-10-CM | POA: Insufficient documentation

## 2017-10-17 DIAGNOSIS — Z5111 Encounter for antineoplastic chemotherapy: Secondary | ICD-10-CM

## 2017-10-17 LAB — COMPREHENSIVE METABOLIC PANEL
ALK PHOS: 53 U/L (ref 38–126)
ALT: 26 U/L (ref 14–54)
ANION GAP: 9 (ref 5–15)
AST: 27 U/L (ref 15–41)
Albumin: 3.9 g/dL (ref 3.5–5.0)
BUN: 16 mg/dL (ref 6–20)
CALCIUM: 9 mg/dL (ref 8.9–10.3)
CO2: 25 mmol/L (ref 22–32)
CREATININE: 0.54 mg/dL (ref 0.44–1.00)
Chloride: 103 mmol/L (ref 101–111)
GFR calc non Af Amer: >60 mL/min (ref >60)
Glucose, Bld: 106 mg/dL — ABNORMAL HIGH (ref 65–99)
Potassium: 3.8 mmol/L (ref 3.5–5.1)
SODIUM: 137 mmol/L (ref 135–145)
Total Bilirubin: 0.3 mg/dL (ref 0.3–1.2)
Total Protein: 6.9 g/dL (ref 6.5–8.1)

## 2017-10-17 LAB — CBC WITH DIFFERENTIAL/PLATELET
BASOS PCT: 1 %
Basophils Absolute: 0.1 10*3/uL (ref 0–0.1)
EOS ABS: 0.2 10*3/uL (ref 0–0.7)
Eosinophils Relative: 3 %
HEMATOCRIT: 34.3 % — AB (ref 35.0–47.0)
HEMOGLOBIN: 11.9 g/dL — AB (ref 12.0–16.0)
Lymphocytes Relative: 21 %
Lymphs Abs: 1.2 10*3/uL (ref 1.0–3.6)
MCH: 31.3 pg (ref 26.0–34.0)
MCHC: 34.7 g/dL (ref 32.0–36.0)
MCV: 90.1 fL (ref 80.0–100.0)
MONOS PCT: 6 %
Monocytes Absolute: 0.4 10*3/uL (ref 0.2–0.9)
NEUTROS ABS: 3.8 10*3/uL (ref 1.4–6.5)
Neutrophils Relative %: 69 %
Platelets: 341 10*3/uL (ref 150–440)
RBC: 3.81 MIL/uL (ref 3.80–5.20)
RDW: 14.9 % — ABNORMAL HIGH (ref 11.5–14.5)
WBC: 5.6 10*3/uL (ref 3.6–11.0)

## 2017-10-17 LAB — PREGNANCY, URINE: PREG TEST UR: NEGATIVE

## 2017-10-17 MED ORDER — HEPARIN SOD (PORK) LOCK FLUSH 100 UNIT/ML IV SOLN: 500.0000 [IU] | Freq: Once | INTRAVENOUS | Status: DC

## 2017-10-17 MED ORDER — SODIUM CHLORIDE 0.9 % IV SOLN
Freq: Once | INTRAVENOUS | Status: AC
  Administered 2017-10-17: 10:00:00 via INTRAVENOUS
  Filled 2017-10-17: qty 100

## 2017-10-17 MED ORDER — SODIUM CHLORIDE 0.9% FLUSH
10.0000 mL | INTRAVENOUS | Status: DC | PRN
  Administered 2017-10-17: 10 mL via INTRAVENOUS
  Filled 2017-10-17: qty 10

## 2017-10-17 MED ORDER — SODIUM CHLORIDE 0.9 % IV SOLN
20.0000 mg | Freq: Once | INTRAVENOUS | Status: AC
  Administered 2017-10-17: 20 mg via INTRAVENOUS
  Filled 2017-10-17: qty 2

## 2017-10-17 MED ORDER — SODIUM CHLORIDE 0.9 % IV SOLN
80.0000 mg/m2 | Freq: Once | INTRAVENOUS | Status: AC
  Administered 2017-10-17: 150 mg via INTRAVENOUS
  Filled 2017-10-17: qty 25

## 2017-10-17 MED ORDER — HEPARIN SOD (PORK) LOCK FLUSH 100 UNIT/ML IV SOLN
500.0000 [IU] | Freq: Once | INTRAVENOUS | Status: AC | PRN
  Administered 2017-10-17: 500 [IU]
  Filled 2017-10-17: qty 5

## 2017-10-17 MED ORDER — DIPHENHYDRAMINE HCL 50 MG/ML IJ SOLN
50.0000 mg | Freq: Once | INTRAMUSCULAR | Status: AC
  Administered 2017-10-17: 50 mg via INTRAVENOUS
  Filled 2017-10-17: qty 1

## 2017-10-17 MED ORDER — SODIUM CHLORIDE 0.9 % IV SOLN
Freq: Once | INTRAVENOUS | Status: AC
  Administered 2017-10-17: 10:00:00 via INTRAVENOUS
  Filled 2017-10-17: qty 1000

## 2017-10-17 MED ORDER — FAMOTIDINE IN NACL 20-0.9 MG/50ML-% IV SOLN: 20.0000 mg | Freq: Once | INTRAVENOUS | Status: DC

## 2017-10-17 NOTE — Progress Notes (Signed)
Hematology/Oncology Follow up note [facility] Telephone:([phone] [phone]   Patient Care Team: [name], NP as PCP - General (Nurse Practitioner)  REFERRING PROVIDER: [name], NP   REASON FOR VISIT on chemotherapy management of breast cancer   HISTORY OF PRESENTING ILLNESS:  Anne Wu is a  37 y.o.  female with diagnosis of cT2N0M0, grade 2 invasive mammary carcinoma.  She has significant family history on both maternal side as well as paternal side. She felt a right breast mass 2 months ago and diagnostic mammogram showed right upper quandrant 2.2 cm mass, which was confirmed on [place] and biopsied. Right axillary no clinically suspicious lymph node. Pathology showed invasive mammary carcinoma, ER/PR positive,, HER2 IHC  equivocal, FISH negative.   # Patient was evaluated by Dr.Smith. From surgical standpoint, Dr.Smith recommend neoadjuvant chemotherapy to facilitate surgery to obtain negative margin and better surgical outcome. Also given patient's young age, neoadjuvant chemotherapy is reasonable.    #Patient stated that she does not desire future fertility.  # 2D Echo showed normal systolic function, mild mitral regurgitation.   # Testing did not reveal any pathogenic mutation in any of these genes. A copy of the genetic test report will be scanned into Epic under the Media tab.The genes analyzed were the 23 genes on [facility]'s Breast/GYN panel (ATM, BARD1, BRCA1, BRCA2, BRIP1, CDH1, CHEK2, DICER1, EPCAM, MLH1,  MSH2, MSH6, NBN, NF1, PALB2, PMS2, PTEN, RAD50, RAD51C, RAD51D,SMARCA4, STK11, and TP53).  09/08/2017 Interval mammogram after 4 cycle of AC, showed good treatment response. The biopsy-proven malignancy in the upper outer quadrant of the right breast at posterior depth, associated with scattered microcalcifications and architectural distortion, has significantly decreased in size since the mammogram 06/20/2017. On today's  mammogram, the mass measures approximately 1.7 x 2.2 x 1.2 cm (previously 2.4 x 1.6 x 2.7 cm).   INTERVAL HISTORY Emmalin Wu is a 37 y.o. female who has above history reviewed by me today presents for evaluation prior to cycle 6 neoadjuvant weekly Taxol for treatment of breast cancer (cT2cN0cM0).  Patient reports doing well without any complaints.  Denies any nausea vomiting, abdominal pain, fatigue, numbness or tingling.    Review of Systems  Constitutional: Negative.  Negative for chills and fever.  HENT: Negative for congestion, ear discharge and hearing loss.   Eyes: Negative for photophobia and pain.  Respiratory: Negative.  Negative for cough and hemoptysis.   Cardiovascular: Negative for chest pain, palpitations and orthopnea.  Gastrointestinal: Negative for abdominal pain, nausea and vomiting.  Musculoskeletal: Negative for back pain, myalgias and neck pain.  Skin: Negative for rash.  Neurological: Negative for dizziness, tingling, tremors, sensory change and weakness.  Endo/Heme/Allergies: Negative for environmental allergies. Does not bruise/bleed easily.  Psychiatric/Behavioral: Negative for depression and substance abuse.    MEDICAL HISTORY:  Past Medical History:  Diagnosis Date  . Asthma    AS A CHILD-NO INHALERS  . Breast cancer ([place])   . Elevated blood pressure, situational    PT STATES HER LAST COUPLE OF MD APPOINTMENTS SHE HAS HAD ELEVATED BP-NEVER HAD A PROBLEM WITH THIS PREVIOUSLY  . Genetic testing 09/05/2017   Breast/GYN panel (23 genes) @ [facility] - No pathogenic mutations detected  . GERD (gastroesophageal reflux disease)    OCC- NO MEDS  . Personal history of chemotherapy     SURGICAL HISTORY: Past Surgical History:  Procedure Laterality Date  . BREAST BIOPSY Right 06/20/2017   Invasive Mammary Carcinoma  . NO PAST SURGERIES    .  PORTACATH PLACEMENT N/A 07/11/2017   Procedure: INSERTION PORT-A-CATH;  Surgeon: Leonie Green, MD;  Location: [facility]  ORS;  Service: General;  Laterality: N/A;    SOCIAL HISTORY: Social History   Socioeconomic History  . Marital status: Married    Spouse name: Not on file  . Number of children: Not on file  . Years of education: Not on file  . Highest education level: Not on file  Occupational History  . Not on file  Social Needs  . Financial resource strain: Not on file  . Food insecurity:    Worry: Not on file    Inability: Not on file  . Transportation needs:    Medical: Not on file    Non-medical: Not on file  Tobacco Use  . Smoking status: Never Smoker  . Smokeless tobacco: Never Used  Substance and Sexual Activity  . Alcohol use: No    Alcohol/week: 0.0 oz  . Drug use: No  . Sexual activity: Yes  Lifestyle  . Physical activity:    Days per week: Not on file    Minutes per session: Not on file  . Stress: Not on file  Relationships  . Social connections:    Talks on phone: Not on file    Gets together: Not on file    Attends religious service: Not on file    Active member of club or organization: Not on file    Attends meetings of clubs or organizations: Not on file    Relationship status: Not on file  . Intimate partner violence:    Fear of current or ex partner: Not on file    Emotionally abused: Not on file    Physically abused: Not on file    Forced sexual activity: Not on file  Other Topics Concern  . Not on file  Social History Narrative  . Not on file    FAMILY HISTORY: Family History  Problem Relation Age of Onset  . Breast cancer Maternal Aunt 49       currently late 43s  . Breast cancer Maternal Grandmother 68       second breast vs. recurrence ca at 58; deceased at 27  . Colon cancer Maternal Uncle 48       deceased in 62s  . Breast cancer Paternal Aunt        age at dx unknown; currently in 67s  . Breast cancer Paternal Grandmother        age at dx unknown  . Melanoma Mother        on leg  . Testicular cancer Maternal Uncle 33       currently 38s      ALLERGIES:  is allergic to codeine.  MEDICATIONS:  Current Outpatient Medications  Medication Sig Dispense Refill  . acetaminophen (TYLENOL) 325 MG tablet Take 650 mg by mouth every 6 (six) hours as needed (for pain.).     Marland Kitchen lidocaine-prilocaine (EMLA) cream Apply to affected area once 30 g 3  . loratadine (CLARITIN) 10 MG tablet Take 10 mg by mouth daily. One tablet 3 days after treatment    . Multiple Vitamin (MULTI-VITAMINS) TABS Take by mouth.    . chlorhexidine (PERIDEX) 0.12 % solution Use as directed 15 mLs in the mouth or throat 2 (two) times daily. (Patient not taking: Reported on 09/26/2017) 473 mL 0  . HYDROcodone-acetaminophen (NORCO) 5-325 MG tablet Take 1-2 tablets by mouth every 6 (six) hours as needed for moderate pain. (Patient not  taking: Reported on 09/26/2017) 6 tablet 0  . ibuprofen (ADVIL,MOTRIN) 200 MG tablet Take 400 mg by mouth every 8 (eight) hours as needed (for pain.).    Marland Kitchen ondansetron (ZOFRAN) 8 MG tablet Take 1 tablet (8 mg total) by mouth 2 (two) times daily as needed. Start on the third day after chemotherapy. (Patient not taking: Reported on 10/17/2017) 30 tablet 1  . prochlorperazine (COMPAZINE) 10 MG tablet Take 1 tablet (10 mg total) by mouth every 6 (six) hours as needed (Nausea or vomiting). (Patient not taking: Reported on 09/26/2017) 30 tablet 1   No current facility-administered medications for this visit.    Facility-Administered Medications Ordered in Other Visits  Medication Dose Route Frequency Provider Last Rate Last Dose  . heparin lock flush 100 unit/mL  500 Units Intravenous Once [name], MD      . sodium chloride flush (NS) 0.9 % injection 10 mL  10 mL Intravenous PRN [name], MD   10 mL at 08/01/17 0829     PHYSICAL EXAMINATION: ECOG PERFORMANCE STATUS: 0 - Asymptomatic Vitals:   10/17/17 0856  BP: (!) 146/86  Pulse: 98  Resp: 18  Temp: (!) 97.2 F (36.2 C)   Filed Weights   10/17/17 0856  Weight: 189 lb 11.2 oz (86 kg)     Physical Exam  Constitutional: She is oriented to person, place, and time and well-developed, well-nourished, and in no distress. No distress.  HENT:  Head: Normocephalic and atraumatic.  Mouth/Throat: Oropharynx is clear and moist. No oropharyngeal exudate.  Eyes: Pupils are equal, round, and reactive to light. EOM are normal. Right eye exhibits no discharge. No scleral icterus.  Neck: Normal range of motion. Neck supple.  Cardiovascular: Normal rate, regular rhythm, normal heart sounds and intact distal pulses.  No murmur heard. Pulmonary/Chest: Effort normal and breath sounds normal. No respiratory distress. She has no rales.  Abdominal: Soft. Bowel sounds are normal. She exhibits no distension. There is no tenderness. There is no rebound.  Musculoskeletal: Normal range of motion. She exhibits no edema or deformity.  Lymphadenopathy:    She has no cervical adenopathy.  Neurological: She is alert and oriented to person, place, and time. Coordination normal.  Skin: Skin is warm and dry. No rash noted. She is not diaphoretic. No erythema.  Psychiatric: Memory, affect and judgment normal.  Breast exam was performed in seated and lying down position. No nipple discharge. Right breast 10'oclock palpable mobile mass barely palpable.No evidence of right axillary adenopathy No evidence of any palpable mass on left breast. No evidence ofleftaxillary adenopathy.       Lab Results  Component Value Date   WBC 5.6 10/17/2017   HGB 11.9 (L) 10/17/2017   HCT 34.3 (L) 10/17/2017   MCV 90.1 10/17/2017   PLT 341 10/17/2017   Recent Labs    10/03/17 0818 10/10/17 0943 10/17/17 0844  NA 135 134* 137  K 3.9 4.0 3.8  CL 102 102 103  CO2 '24 25 25  ' GLUCOSE 118* 115* 106*  BUN '15 14 16  ' CREATININE 0.52 0.54 0.54  CALCIUM 9.1 9.2 9.0  GFRNONAA >60 >60 >60  GFRAA >60 >60 >60  PROT 7.0 7.2 6.9  ALBUMIN 3.8 3.8 3.9  AST '30 31 27  ' ALT 29 34 26  ALKPHOS 62 55 53  BILITOT 0.5 0.7 0.3     2D Echo was done which showed LVEF 55-65%, mild mitral valve regurgitation.    ASSESSMENT & PLAN:  1. Malignant neoplasm  of upper-outer quadrant of right breast in female, estrogen receptor positive ([place])   2. Encounter for antineoplastic chemotherapy    cT2N0M0, grade 2 Today urine pregnancy test is negative.  Patient tolerated chemotherapy well.   Okay to proceed with cycle 6 weekly Taxol today.   CBC CMP, urine pregnancy test prior to next cycle of chemotherapy.   #  All questions were answered. The patient knows to call the clinic with any problems questions or concerns.  Return visit: Labs prior to  cycle 5 taxol and see me in 2 weeks prior to cycle 6 Taxol.  [name], MD, PhD Hematology Oncology [facility] at [facility] Pager- [phone] 10/17/2017

## 2017-10-17 NOTE — Progress Notes (Signed)
Here for follow up. Feeling good per pt

## 2017-10-24 ENCOUNTER — Inpatient Hospital Stay: Payer: 59

## 2017-10-24 VITALS — BP 131/83 | HR 100 | Temp 97.4°F | Resp 18

## 2017-10-24 DIAGNOSIS — C50411 Malignant neoplasm of upper-outer quadrant of right female breast: Secondary | ICD-10-CM

## 2017-10-24 DIAGNOSIS — Z170 Estrogen receptor positive status [ER+]: Principal | ICD-10-CM

## 2017-10-24 DIAGNOSIS — Z5111 Encounter for antineoplastic chemotherapy: Secondary | ICD-10-CM | POA: Diagnosis not present

## 2017-10-24 LAB — CBC WITH DIFFERENTIAL/PLATELET
BASOS ABS: 0.1 10*3/uL (ref 0–0.1)
BASOS PCT: 1 %
EOS PCT: 2 %
Eosinophils Absolute: 0.1 10*3/uL (ref 0–0.7)
HCT: 35.9 % (ref 35.0–47.0)
Hemoglobin: 12.3 g/dL (ref 12.0–16.0)
Lymphocytes Relative: 19 %
Lymphs Abs: 1.2 10*3/uL (ref 1.0–3.6)
MCH: 31.4 pg (ref 26.0–34.0)
MCHC: 34.4 g/dL (ref 32.0–36.0)
MCV: 91.2 fL (ref 80.0–100.0)
MONO ABS: 0.4 10*3/uL (ref 0.2–0.9)
Monocytes Relative: 7 %
Neutro Abs: 4.6 10*3/uL (ref 1.4–6.5)
Neutrophils Relative %: 71 %
PLATELETS: 372 10*3/uL (ref 150–440)
RBC: 3.94 MIL/uL (ref 3.80–5.20)
RDW: 14.7 % — AB (ref 11.5–14.5)
WBC: 6.5 10*3/uL (ref 3.6–11.0)

## 2017-10-24 LAB — COMPREHENSIVE METABOLIC PANEL
ALBUMIN: 4 g/dL (ref 3.5–5.0)
ALK PHOS: 57 U/L (ref 38–126)
ALT: 27 U/L (ref 14–54)
AST: 30 U/L (ref 15–41)
Anion gap: 9 (ref 5–15)
BILIRUBIN TOTAL: 0.4 mg/dL (ref 0.3–1.2)
BUN: 11 mg/dL (ref 6–20)
CALCIUM: 9.4 mg/dL (ref 8.9–10.3)
CO2: 24 mmol/L (ref 22–32)
CREATININE: 0.53 mg/dL (ref 0.44–1.00)
Chloride: 103 mmol/L (ref 101–111)
GFR calc Af Amer: >60 mL/min (ref >60)
GFR calc non Af Amer: >60 mL/min (ref >60)
GLUCOSE: 119 mg/dL — AB (ref 65–99)
Potassium: 4 mmol/L (ref 3.5–5.1)
Sodium: 136 mmol/L (ref 135–145)
TOTAL PROTEIN: 7.2 g/dL (ref 6.5–8.1)

## 2017-10-24 LAB — PREGNANCY, URINE: Preg Test, Ur: NEGATIVE

## 2017-10-24 MED ORDER — FAMOTIDINE IN NACL 20-0.9 MG/50ML-% IV SOLN: 20.0000 mg | Freq: Once | INTRAVENOUS | Status: DC

## 2017-10-24 MED ORDER — SODIUM CHLORIDE 0.9 % IV SOLN
Freq: Once | INTRAVENOUS | Status: AC
  Administered 2017-10-24: 12:00:00 via INTRAVENOUS
  Filled 2017-10-24: qty 1000

## 2017-10-24 MED ORDER — DIPHENHYDRAMINE HCL 50 MG/ML IJ SOLN
50.0000 mg | Freq: Once | INTRAMUSCULAR | Status: AC
  Administered 2017-10-24: 50 mg via INTRAVENOUS
  Filled 2017-10-24: qty 1

## 2017-10-24 MED ORDER — SODIUM CHLORIDE 0.9 % IV SOLN
INTRAVENOUS | Status: DC
  Filled 2017-10-24: qty 100

## 2017-10-24 MED ORDER — SODIUM CHLORIDE 0.9 % IV SOLN
20.0000 mg | Freq: Once | INTRAVENOUS | Status: AC
  Administered 2017-10-24: 20 mg via INTRAVENOUS
  Filled 2017-10-24: qty 2

## 2017-10-24 MED ORDER — SODIUM CHLORIDE 0.9% FLUSH
10.0000 mL | Freq: Once | INTRAVENOUS | Status: AC
  Administered 2017-10-24: 10 mL via INTRAVENOUS
  Filled 2017-10-24: qty 10

## 2017-10-24 MED ORDER — HEPARIN SOD (PORK) LOCK FLUSH 100 UNIT/ML IV SOLN
500.0000 [IU] | Freq: Once | INTRAVENOUS | Status: AC
  Administered 2017-10-24: 500 [IU] via INTRAVENOUS
  Filled 2017-10-24: qty 5

## 2017-10-24 MED ORDER — SODIUM CHLORIDE 0.9 % IV SOLN
80.0000 mg/m2 | Freq: Once | INTRAVENOUS | Status: AC
  Administered 2017-10-24: 150 mg via INTRAVENOUS
  Filled 2017-10-24: qty 25

## 2017-10-24 MED ORDER — FAMOTIDINE IN NACL 20-0.9 MG/50ML-% IV SOLN
20.0000 mg | Freq: Once | INTRAVENOUS | Status: AC
  Administered 2017-10-24: 20 mg via INTRAVENOUS
  Filled 2017-10-24: qty 50

## 2017-10-30 ENCOUNTER — Other Ambulatory Visit: Payer: Self-pay | Admitting: *Deleted

## 2017-10-30 DIAGNOSIS — Z170 Estrogen receptor positive status [ER+]: Principal | ICD-10-CM

## 2017-10-30 DIAGNOSIS — C50411 Malignant neoplasm of upper-outer quadrant of right female breast: Secondary | ICD-10-CM

## 2017-10-31 ENCOUNTER — Inpatient Hospital Stay: Payer: 59

## 2017-10-31 ENCOUNTER — Inpatient Hospital Stay (HOSPITAL_BASED_OUTPATIENT_CLINIC_OR_DEPARTMENT_OTHER): Payer: 59 | Admitting: Oncology

## 2017-10-31 ENCOUNTER — Encounter: Payer: Self-pay | Admitting: Oncology

## 2017-10-31 VITALS — BP 124/88 | HR 121 | Temp 98.9°F | Resp 16 | Wt 190.9 lb

## 2017-10-31 VITALS — HR 99

## 2017-10-31 DIAGNOSIS — C50411 Malignant neoplasm of upper-outer quadrant of right female breast: Secondary | ICD-10-CM | POA: Diagnosis not present

## 2017-10-31 DIAGNOSIS — Z170 Estrogen receptor positive status [ER+]: Secondary | ICD-10-CM | POA: Diagnosis not present

## 2017-10-31 DIAGNOSIS — Z803 Family history of malignant neoplasm of breast: Secondary | ICD-10-CM

## 2017-10-31 DIAGNOSIS — Z79899 Other long term (current) drug therapy: Secondary | ICD-10-CM

## 2017-10-31 DIAGNOSIS — Z5111 Encounter for antineoplastic chemotherapy: Secondary | ICD-10-CM

## 2017-10-31 LAB — CBC WITH DIFFERENTIAL/PLATELET
Basophils Absolute: 0.1 10*3/uL (ref 0–0.1)
Basophils Relative: 1 %
Eosinophils Absolute: 0.2 10*3/uL (ref 0–0.7)
Eosinophils Relative: 3 %
HEMATOCRIT: 36.6 % (ref 35.0–47.0)
Hemoglobin: 12.6 g/dL (ref 12.0–16.0)
LYMPHS ABS: 1.3 10*3/uL (ref 1.0–3.6)
Lymphocytes Relative: 20 %
MCH: 31.3 pg (ref 26.0–34.0)
MCHC: 34.4 g/dL (ref 32.0–36.0)
MCV: 91.1 fL (ref 80.0–100.0)
MONO ABS: 0.5 10*3/uL (ref 0.2–0.9)
Monocytes Relative: 7 %
NEUTROS ABS: 4.6 10*3/uL (ref 1.4–6.5)
Neutrophils Relative %: 69 %
Platelets: 369 10*3/uL (ref 150–440)
RBC: 4.02 MIL/uL (ref 3.80–5.20)
RDW: 14.7 % — AB (ref 11.5–14.5)
WBC: 6.6 10*3/uL (ref 3.6–11.0)

## 2017-10-31 LAB — COMPREHENSIVE METABOLIC PANEL
ALBUMIN: 4 g/dL (ref 3.5–5.0)
ALK PHOS: 54 U/L (ref 38–126)
ALT: 19 U/L (ref 14–54)
ANION GAP: 9 (ref 5–15)
AST: 26 U/L (ref 15–41)
BUN: 9 mg/dL (ref 6–20)
CHLORIDE: 101 mmol/L (ref 101–111)
CO2: 25 mmol/L (ref 22–32)
Calcium: 9.3 mg/dL (ref 8.9–10.3)
Creatinine, Ser: 0.53 mg/dL (ref 0.44–1.00)
GFR calc Af Amer: >60 mL/min (ref >60)
GFR calc non Af Amer: >60 mL/min (ref >60)
GLUCOSE: 95 mg/dL (ref 65–99)
Potassium: 4.1 mmol/L (ref 3.5–5.1)
SODIUM: 135 mmol/L (ref 135–145)
Total Bilirubin: 0.6 mg/dL (ref 0.3–1.2)
Total Protein: 7.3 g/dL (ref 6.5–8.1)

## 2017-10-31 LAB — PREGNANCY, URINE: Preg Test, Ur: NEGATIVE

## 2017-10-31 MED ORDER — DIPHENHYDRAMINE HCL 50 MG/ML IJ SOLN
50.0000 mg | Freq: Once | INTRAMUSCULAR | Status: AC
  Administered 2017-10-31: 50 mg via INTRAVENOUS
  Filled 2017-10-31: qty 1

## 2017-10-31 MED ORDER — SODIUM CHLORIDE 0.9 % IV SOLN
20.0000 mg | Freq: Once | INTRAVENOUS | Status: AC
  Administered 2017-10-31: 20 mg via INTRAVENOUS
  Filled 2017-10-31: qty 2

## 2017-10-31 MED ORDER — SODIUM CHLORIDE 0.9 % IV SOLN
Freq: Once | INTRAVENOUS | Status: AC
  Administered 2017-10-31: 10:00:00 via INTRAVENOUS
  Filled 2017-10-31: qty 1000

## 2017-10-31 MED ORDER — FAMOTIDINE IN NACL 20-0.9 MG/50ML-% IV SOLN
20.0000 mg | Freq: Once | INTRAVENOUS | Status: AC
  Administered 2017-10-31: 20 mg via INTRAVENOUS
  Filled 2017-10-31: qty 50

## 2017-10-31 MED ORDER — SODIUM CHLORIDE 0.9% FLUSH
10.0000 mL | INTRAVENOUS | Status: DC | PRN
  Filled 2017-10-31: qty 10

## 2017-10-31 MED ORDER — HEPARIN SOD (PORK) LOCK FLUSH 100 UNIT/ML IV SOLN
500.0000 [IU] | Freq: Once | INTRAVENOUS | Status: AC
  Administered 2017-10-31: 500 [IU] via INTRAVENOUS
  Filled 2017-10-31: qty 5

## 2017-10-31 MED ORDER — SODIUM CHLORIDE 0.9 % IV SOLN
80.0000 mg/m2 | Freq: Once | INTRAVENOUS | Status: AC
  Administered 2017-10-31: 150 mg via INTRAVENOUS
  Filled 2017-10-31: qty 25

## 2017-10-31 NOTE — Progress Notes (Signed)
Hematology/Oncology Follow up note [facility] Telephone:(336) [phone] Fax:(336) [phone]   Patient Care Team: [name], NP as PCP - General (Nurse Practitioner)  REFERRING PROVIDER: [name], NP   REASON FOR VISIT on chemotherapy management of breast cancer   HISTORY OF PRESENTING ILLNESS:  Anne Wu is a  37 y.o.  female with diagnosis of cT2N0M0, grade 2 invasive mammary carcinoma.  She has significant family history on both maternal side as well as paternal side. She felt a right breast mass 2 months ago and diagnostic mammogram showed right upper quandrant 2.2 cm mass, which was confirmed on [place] and biopsied. Right axillary no clinically suspicious lymph node. Pathology showed invasive mammary carcinoma, ER/PR positive,, HER2 IHC  equivocal, FISH negative.   # Patient was evaluated by Dr.Smith. From surgical standpoint, Dr.Smith recommend neoadjuvant chemotherapy to facilitate surgery to obtain negative margin and better surgical outcome. Also given patient's young age, neoadjuvant chemotherapy is reasonable.    #Patient stated that she does not desire future fertility.  # 2D Echo showed normal systolic function, mild mitral regurgitation.   # Testing did not reveal any pathogenic mutation in any of these genes. A copy of the genetic test report will be scanned into Epic under the Media tab.The genes analyzed were the 23 genes on [facility]'s Breast/GYN panel (ATM, BARD1, BRCA1, BRCA2, BRIP1, CDH1, CHEK2, DICER1, EPCAM, MLH1,  MSH2, MSH6, NBN, NF1, PALB2, PMS2, PTEN, RAD50, RAD51C, RAD51D,SMARCA4, STK11, and TP53).  09/08/2017 Interval mammogram after 4 cycle of AC, showed good treatment response. The biopsy-proven malignancy in the upper outer quadrant of the right breast at posterior depth, associated with scattered microcalcifications and architectural distortion, has significantly decreased in size since the mammogram 06/20/2017. On today's  mammogram, the mass measures approximately 1.7 x 2.2 x 1.2 cm (previously 2.4 x 1.6 x 2.7 cm).   INTERVAL HISTORY Anne Wu is a 37 y.o. female who has above history reviewed by me today presents for evaluation prior to cycle 8 neoadjuvant weekly Taxol for treatment of breast cancer (cT2cN0cM0).  She continues to do very well. Denies any nausea vomiting, pain, fagitue or tingling numbness.      Review of Systems  Constitutional: Negative for chills, fever, malaise/fatigue and weight loss.  HENT: Negative for congestion, ear discharge, hearing loss and sinus pain.   Eyes: Negative for double vision, photophobia and pain.  Respiratory: Negative.  Negative for cough, hemoptysis and sputum production.   Cardiovascular: Negative for chest pain, palpitations, orthopnea and claudication.  Gastrointestinal: Negative for abdominal pain, blood in stool, constipation, nausea and vomiting.  Genitourinary: Negative for dysuria and urgency.  Musculoskeletal: Negative for back pain, myalgias and neck pain.  Skin: Negative for itching and rash.  Neurological: Negative for dizziness, tingling, tremors, sensory change and weakness.  Endo/Heme/Allergies: Negative for environmental allergies and polydipsia. Does not bruise/bleed easily.  Psychiatric/Behavioral: Negative for depression, substance abuse and suicidal ideas.    MEDICAL HISTORY:  Past Medical History:  Diagnosis Date  . Asthma    AS A CHILD-NO INHALERS  . Breast cancer ([place])   . Elevated blood pressure, situational    PT STATES HER LAST COUPLE OF MD APPOINTMENTS SHE HAS HAD ELEVATED BP-NEVER HAD A PROBLEM WITH THIS PREVIOUSLY  . Genetic testing 09/05/2017   Breast/GYN panel (23 genes) @ [facility] - No pathogenic mutations detected  . GERD (gastroesophageal reflux disease)    OCC- NO MEDS  . Personal history of chemotherapy     SURGICAL HISTORY:  Past Surgical History:  Procedure Laterality Date  . BREAST BIOPSY Right 06/20/2017    Invasive Mammary Carcinoma  . NO PAST SURGERIES    . PORTACATH PLACEMENT N/A 07/11/2017   Procedure: INSERTION PORT-A-CATH;  Surgeon: Leonie Green, MD;  Location: [facility] ORS;  Service: General;  Laterality: N/A;    SOCIAL HISTORY: Social History   Socioeconomic History  . Marital status: Married    Spouse name: Not on file  . Number of children: Not on file  . Years of education: Not on file  . Highest education level: Not on file  Occupational History  . Not on file  Social Needs  . Financial resource strain: Not on file  . Food insecurity:    Worry: Not on file    Inability: Not on file  . Transportation needs:    Medical: Not on file    Non-medical: Not on file  Tobacco Use  . Smoking status: Never Smoker  . Smokeless tobacco: Never Used  Substance and Sexual Activity  . Alcohol use: No    Alcohol/week: 0.0 oz  . Drug use: No  . Sexual activity: Yes  Lifestyle  . Physical activity:    Days per week: Not on file    Minutes per session: Not on file  . Stress: Not on file  Relationships  . Social connections:    Talks on phone: Not on file    Gets together: Not on file    Attends religious service: Not on file    Active member of club or organization: Not on file    Attends meetings of clubs or organizations: Not on file    Relationship status: Not on file  . Intimate partner violence:    Fear of current or ex partner: Not on file    Emotionally abused: Not on file    Physically abused: Not on file    Forced sexual activity: Not on file  Other Topics Concern  . Not on file  Social History Narrative  . Not on file    FAMILY HISTORY: Family History  Problem Relation Age of Onset  . Breast cancer Maternal Aunt 27       currently late 39s  . Breast cancer Maternal Grandmother 68       second breast vs. recurrence ca at 52; deceased at 70  . Colon cancer Maternal Uncle 60       deceased in 60s  . Breast cancer Paternal Aunt        age at dx unknown;  currently in 72s  . Breast cancer Paternal Grandmother        age at dx unknown  . Melanoma Mother        on leg  . Testicular cancer Maternal Uncle 54       currently 94s    ALLERGIES:  is allergic to codeine.  MEDICATIONS:  Current Outpatient Medications  Medication Sig Dispense Refill  . acetaminophen (TYLENOL) 325 MG tablet Take 650 mg by mouth every 6 (six) hours as needed (for pain.).     Marland Kitchen lidocaine-prilocaine (EMLA) cream Apply to affected area once 30 g 3  . loratadine (CLARITIN) 10 MG tablet Take 10 mg by mouth daily. One tablet 3 days after treatment    . Multiple Vitamin (MULTI-VITAMINS) TABS Take by mouth.    . chlorhexidine (PERIDEX) 0.12 % solution Use as directed 15 mLs in the mouth or throat 2 (two) times daily. (Patient not taking: Reported on 09/26/2017)  473 mL 0  . HYDROcodone-acetaminophen (NORCO) 5-325 MG tablet Take 1-2 tablets by mouth every 6 (six) hours as needed for moderate pain. (Patient not taking: Reported on 09/26/2017) 6 tablet 0  . ibuprofen (ADVIL,MOTRIN) 200 MG tablet Take 400 mg by mouth every 8 (eight) hours as needed (for pain.).    Marland Kitchen ondansetron (ZOFRAN) 8 MG tablet Take 1 tablet (8 mg total) by mouth 2 (two) times daily as needed. Start on the third day after chemotherapy. (Patient not taking: Reported on 10/17/2017) 30 tablet 1  . prochlorperazine (COMPAZINE) 10 MG tablet Take 1 tablet (10 mg total) by mouth every 6 (six) hours as needed (Nausea or vomiting). (Patient not taking: Reported on 09/26/2017) 30 tablet 1   No current facility-administered medications for this visit.    Facility-Administered Medications Ordered in Other Visits  Medication Dose Route Frequency Provider Last Rate Last Dose  . heparin lock flush 100 unit/mL  500 Units Intravenous Once [name], MD      . heparin lock flush 100 unit/mL  500 Units Intravenous Once [name], MD      . sodium chloride flush (NS) 0.9 % injection 10 mL  10 mL Intravenous PRN [name], MD   10 mL at  08/01/17 0829  . sodium chloride flush (NS) 0.9 % injection 10 mL  10 mL Intravenous PRN [name], MD         PHYSICAL EXAMINATION: ECOG PERFORMANCE STATUS: 0 - Asymptomatic Vitals:   10/31/17 0906  BP: 124/88  Pulse: (!) 121  Resp: 16  Temp: 98.9 F (37.2 C)   Filed Weights   10/31/17 0906  Weight: 190 lb 14.4 oz (86.6 kg)    Physical Exam  Constitutional: She is oriented to person, place, and time and well-developed, well-nourished, and in no distress. No distress.  HENT:  Head: Normocephalic and atraumatic.  Right Ear: External ear normal.  Left Ear: External ear normal.  Mouth/Throat: Oropharynx is clear and moist. No oropharyngeal exudate.  Eyes: Pupils are equal, round, and reactive to light. EOM are normal. Right eye exhibits no discharge. Left eye exhibits no discharge. No scleral icterus.  Neck: Normal range of motion. Neck supple. No JVD present.  Cardiovascular: Normal rate, regular rhythm, normal heart sounds and intact distal pulses. Exam reveals no friction rub.  No murmur heard. Pulmonary/Chest: Effort normal and breath sounds normal. No respiratory distress. She has no rales.  Abdominal: Soft. Bowel sounds are normal. She exhibits no distension. There is no guarding.  Musculoskeletal: Normal range of motion. She exhibits no edema or deformity.  Lymphadenopathy:    She has no cervical adenopathy.  Neurological: She is alert and oriented to person, place, and time. No cranial nerve deficit.  Skin: Skin is warm and dry. No rash noted. She is not diaphoretic. No pallor.  Psychiatric: Memory, affect and judgment normal.  Breast exam was performed in seated and lying down position. No nipple discharge. Right breast 10'oclock palpable mobile mass not palpable.bilateraly axillary lymph nodes not palpable.       Lab Results  Component Value Date   WBC 6.6 10/31/2017   HGB 12.6 10/31/2017   HCT 36.6 10/31/2017   MCV 91.1 10/31/2017   PLT 369 10/31/2017    Recent Labs    10/17/17 0844 10/24/17 1046 10/31/17 0846  NA 137 136 135  K 3.8 4.0 4.1  CL 103 103 101  CO2 _0 GLUCOSE 106* 119* 95  BUN 16 11 9  CREATININE 0.54 0.53 0.53  CALCIUM 9.0 9.4 9.3  GFRNONAA >60 >60 >60  GFRAA >60 >60 >60  PROT 6.9 7.2 7.3  ALBUMIN 3.9 4.0 4.0  AST _0 ALT _1 ALKPHOS 53 57 54  BILITOT 0.3 0.4 0.6    2D Echo was done which showed LVEF 55-65%, mild mitral valve regurgitation.    ASSESSMENT & PLAN:  1. Malignant neoplasm of upper-outer quadrant of right breast in female, estrogen receptor positive ([place])    #cT2N0M0, grade 2 Today urine pregnancy test is negative. Tolerates chemotherapy well without significant side effects.  Proceed with Cycle 8 Taxol today and she will have CBC CMP, urine pregnancy test prior to cycle 9 of chemotherapy.  Plan repeat MM after she finishes 12 cycles of Taxol.  # Will need Surgical evaluation after chemotherapy. Dr.Smith retired. Dr. Tildon Husky is aware of her case.  # Refer to RadOnc to establish care for discussion of adjuvant radiation after surgery.   #  All questions were answered. The patient knows to call the clinic with any problems questions or concerns.  Return visit: Labs prior to  cycle 9 taxol and see me in 2 weeks prior to cycle 10 Taxol. Cc Dr.Cintron and Dr.Chrystal.   [name], MD, PhD Hematology Oncology [facility] at [facility] Pager- [phone] 10/31/2017

## 2017-11-07 ENCOUNTER — Inpatient Hospital Stay: Payer: 59

## 2017-11-07 VITALS — BP 126/86 | HR 94 | Temp 98.0°F | Resp 18 | Wt 192.4 lb

## 2017-11-07 DIAGNOSIS — Z5111 Encounter for antineoplastic chemotherapy: Secondary | ICD-10-CM | POA: Diagnosis not present

## 2017-11-07 DIAGNOSIS — Z170 Estrogen receptor positive status [ER+]: Principal | ICD-10-CM

## 2017-11-07 DIAGNOSIS — C50411 Malignant neoplasm of upper-outer quadrant of right female breast: Secondary | ICD-10-CM

## 2017-11-07 LAB — COMPREHENSIVE METABOLIC PANEL
ALBUMIN: 3.8 g/dL (ref 3.5–5.0)
ALT: 18 U/L (ref 14–54)
AST: 29 U/L (ref 15–41)
Alkaline Phosphatase: 49 U/L (ref 38–126)
Anion gap: 10 (ref 5–15)
BILIRUBIN TOTAL: 0.5 mg/dL (ref 0.3–1.2)
BUN: 12 mg/dL (ref 6–20)
CO2: 23 mmol/L (ref 22–32)
CREATININE: 0.57 mg/dL (ref 0.44–1.00)
Calcium: 9.1 mg/dL (ref 8.9–10.3)
Chloride: 103 mmol/L (ref 101–111)
GFR calc Af Amer: >60 mL/min (ref >60)
GLUCOSE: 136 mg/dL — AB (ref 65–99)
Potassium: 3.8 mmol/L (ref 3.5–5.1)
Sodium: 136 mmol/L (ref 135–145)
TOTAL PROTEIN: 7.1 g/dL (ref 6.5–8.1)

## 2017-11-07 LAB — CBC WITH DIFFERENTIAL/PLATELET
BASOS ABS: 0.1 10*3/uL (ref 0–0.1)
Basophils Relative: 1 %
EOS ABS: 0.1 10*3/uL (ref 0–0.7)
EOS PCT: 2 %
HCT: 33.7 % — ABNORMAL LOW (ref 35.0–47.0)
Hemoglobin: 11.9 g/dL — ABNORMAL LOW (ref 12.0–16.0)
LYMPHS ABS: 1.1 10*3/uL (ref 1.0–3.6)
Lymphocytes Relative: 20 %
MCH: 32.1 pg (ref 26.0–34.0)
MCHC: 35.4 g/dL (ref 32.0–36.0)
MCV: 90.9 fL (ref 80.0–100.0)
MONO ABS: 0.3 10*3/uL (ref 0.2–0.9)
Monocytes Relative: 6 %
Neutro Abs: 3.9 10*3/uL (ref 1.4–6.5)
Neutrophils Relative %: 71 %
PLATELETS: 325 10*3/uL (ref 150–440)
RBC: 3.71 MIL/uL — ABNORMAL LOW (ref 3.80–5.20)
RDW: 13.7 % (ref 11.5–14.5)
WBC: 5.5 10*3/uL (ref 3.6–11.0)

## 2017-11-07 LAB — PREGNANCY, URINE: Preg Test, Ur: NEGATIVE

## 2017-11-07 MED ORDER — SODIUM CHLORIDE 0.9 % IV SOLN
80.0000 mg/m2 | Freq: Once | INTRAVENOUS | Status: AC
  Administered 2017-11-07: 150 mg via INTRAVENOUS
  Filled 2017-11-07: qty 25

## 2017-11-07 MED ORDER — FAMOTIDINE IN NACL 20-0.9 MG/50ML-% IV SOLN
20.0000 mg | Freq: Once | INTRAVENOUS | Status: AC
  Administered 2017-11-07: 20 mg via INTRAVENOUS
  Filled 2017-11-07: qty 50

## 2017-11-07 MED ORDER — DEXAMETHASONE SODIUM PHOSPHATE 100 MG/10ML IJ SOLN
20.0000 mg | Freq: Once | INTRAMUSCULAR | Status: AC
  Administered 2017-11-07: 20 mg via INTRAVENOUS
  Filled 2017-11-07: qty 2

## 2017-11-07 MED ORDER — SODIUM CHLORIDE 0.9% FLUSH
10.0000 mL | INTRAVENOUS | Status: DC | PRN
  Administered 2017-11-07: 10 mL via INTRAVENOUS
  Filled 2017-11-07: qty 10

## 2017-11-07 MED ORDER — HEPARIN SOD (PORK) LOCK FLUSH 100 UNIT/ML IV SOLN
500.0000 [IU] | Freq: Once | INTRAVENOUS | Status: AC
  Administered 2017-11-07: 500 [IU] via INTRAVENOUS
  Filled 2017-11-07: qty 5

## 2017-11-07 MED ORDER — SODIUM CHLORIDE 0.9 % IV SOLN
Freq: Once | INTRAVENOUS | Status: AC
  Administered 2017-11-07: 10:00:00 via INTRAVENOUS
  Filled 2017-11-07: qty 1000

## 2017-11-07 MED ORDER — DIPHENHYDRAMINE HCL 50 MG/ML IJ SOLN
50.0000 mg | Freq: Once | INTRAMUSCULAR | Status: AC
  Administered 2017-11-07: 50 mg via INTRAVENOUS
  Filled 2017-11-07: qty 1

## 2017-11-12 ENCOUNTER — Encounter: Payer: Self-pay | Admitting: Radiation Oncology

## 2017-11-12 ENCOUNTER — Other Ambulatory Visit: Payer: Self-pay

## 2017-11-12 ENCOUNTER — Ambulatory Visit
Admission: RE | Admit: 2017-11-12 | Discharge: 2017-11-12 | Disposition: A | Discharge status: Home / Self Care | Payer: 59 | Source: Ambulatory Visit | Attending: Radiation Oncology | Admitting: Radiation Oncology

## 2017-11-12 VITALS — BP 138/92 | HR 120 | Temp 98.0°F | Resp 18 | Wt 189.8 lb

## 2017-11-12 DIAGNOSIS — Z803 Family history of malignant neoplasm of breast: Secondary | ICD-10-CM | POA: Insufficient documentation

## 2017-11-12 DIAGNOSIS — Z808 Family history of malignant neoplasm of other organs or systems: Secondary | ICD-10-CM | POA: Diagnosis not present

## 2017-11-12 DIAGNOSIS — J45909 Unspecified asthma, uncomplicated: Secondary | ICD-10-CM | POA: Diagnosis not present

## 2017-11-12 DIAGNOSIS — Z170 Estrogen receptor positive status [ER+]: Secondary | ICD-10-CM | POA: Insufficient documentation

## 2017-11-12 DIAGNOSIS — C50411 Malignant neoplasm of upper-outer quadrant of right female breast: Secondary | ICD-10-CM | POA: Insufficient documentation

## 2017-11-12 DIAGNOSIS — Z9221 Personal history of antineoplastic chemotherapy: Secondary | ICD-10-CM | POA: Insufficient documentation

## 2017-11-12 DIAGNOSIS — K219 Gastro-esophageal reflux disease without esophagitis: Secondary | ICD-10-CM | POA: Insufficient documentation

## 2017-11-12 DIAGNOSIS — I10 Essential (primary) hypertension: Secondary | ICD-10-CM | POA: Insufficient documentation

## 2017-11-12 DIAGNOSIS — Z79899 Other long term (current) drug therapy: Secondary | ICD-10-CM | POA: Insufficient documentation

## 2017-11-12 DIAGNOSIS — Z800 Family history of malignant neoplasm of digestive organs: Secondary | ICD-10-CM | POA: Diagnosis not present

## 2017-11-12 NOTE — Consult Note (Signed)
NEW PATIENT EVALUATION  Name: Anne Wu  MRN: [id number]  Date:   11/12/2017     DOB: [birth year]   This 37 y.o. female patient presents to the clinic for initial evaluation of invasive mammary carcinoma.clinical T2 N0 lesion status post neoadjuvant chemotherapy without surgery being performed at this time  REFERRING PHYSICIAN: [name], NP  CHIEF COMPLAINT:  Chief Complaint  Patient presents with  . Breast Cancer    Pt is here for initial consultation of breast cancer.      DIAGNOSIS: The encounter diagnosis was Malignant neoplasm of upper-outer quadrant of right breast in female, estrogen receptor positive ([place]).   PREVIOUS INVESTIGATIONS:  Initial pathology report reviewed Mammograms and ultrasound reviewed Clinical notes reviewed  HPI: patient is a 37 year old female who presented with a self discovered mass in the right breast in the upper outer quadrant.she underwent ultrasound-guided biopsy showing invasive mammary carcinoma with microcalcifications and necrosis overallgrade 2. Tumor was ER/PR positive HER-2/neu not overexpressed. Patient has been started on Cytoxan and Adriamycin and is shown a good treatment response. She is also received cycle 8 of Taxol with 1 Taxol therapy pending. Patient has not had lumpectomy her axillary sentinel node biopsy at this time. She is otherwise doing well as tolerated her chemotherapy well. She specifically denies breast tenderness cough or bone pain.  PLANNED TREATMENT REGIMEN: wide local excision and sentinel node biopsy plus adjuvant radiation therapy  PAST MEDICAL HISTORY:  has a past medical history of Asthma, Breast cancer ([place]), Elevated blood pressure, situational, Genetic testing (09/05/2017), GERD (gastroesophageal reflux disease), and Personal history of chemotherapy.    PAST SURGICAL HISTORY:  Past Surgical History:  Procedure Laterality Date  . BREAST BIOPSY Right 06/20/2017   Invasive Mammary Carcinoma  . NO PAST  SURGERIES    . PORTACATH PLACEMENT N/A 07/11/2017   Procedure: INSERTION PORT-A-CATH;  Surgeon: Leonie Green, MD;  Location: [facility] ORS;  Service: General;  Laterality: N/A;    FAMILY HISTORY: family history includes Breast cancer in her paternal aunt and paternal grandmother; Breast cancer (age of onset: 61) in her maternal aunt; Breast cancer (age of onset: 62) in her maternal grandmother; Colon cancer (age of onset: 52) in her maternal uncle; Melanoma in her mother; Testicular cancer (age of onset: 31) in her maternal uncle.  SOCIAL HISTORY:  reports that she has never smoked. She has never used smokeless tobacco. She reports that she does not drink alcohol or use drugs.  ALLERGIES: Codeine  MEDICATIONS:  Current Outpatient Medications  Medication Sig Dispense Refill  . acetaminophen (TYLENOL) 325 MG tablet Take 650 mg by mouth every 6 (six) hours as needed (for pain.).     Marland Kitchen lidocaine-prilocaine (EMLA) cream Apply to affected area once 30 g 3  . Multiple Vitamin (MULTI-VITAMINS) TABS Take by mouth.    . chlorhexidine (PERIDEX) 0.12 % solution Use as directed 15 mLs in the mouth or throat 2 (two) times daily. (Patient not taking: Reported on 09/26/2017) 473 mL 0  . HYDROcodone-acetaminophen (NORCO) 5-325 MG tablet Take 1-2 tablets by mouth every 6 (six) hours as needed for moderate pain. (Patient not taking: Reported on 09/26/2017) 6 tablet 0  . ibuprofen (ADVIL,MOTRIN) 200 MG tablet Take 400 mg by mouth every 8 (eight) hours as needed (for pain.).    Marland Kitchen loratadine (CLARITIN) 10 MG tablet Take 10 mg by mouth daily. One tablet 3 days after treatment    . ondansetron (ZOFRAN) 8 MG tablet Take 1 tablet (8 mg total)  by mouth 2 (two) times daily as needed. Start on the third day after chemotherapy. (Patient not taking: Reported on 10/17/2017) 30 tablet 1  . prochlorperazine (COMPAZINE) 10 MG tablet Take 1 tablet (10 mg total) by mouth every 6 (six) hours as needed (Nausea or vomiting). (Patient  not taking: Reported on 09/26/2017) 30 tablet 1   No current facility-administered medications for this encounter.    Facility-Administered Medications Ordered in Other Encounters  Medication Dose Route Frequency Provider Last Rate Last Dose  . heparin lock flush 100 unit/mL  500 Units Intravenous Once [name], MD      . sodium chloride flush (NS) 0.9 % injection 10 mL  10 mL Intravenous PRN [name], MD   10 mL at 08/01/17 0829    ECOG PERFORMANCE STATUS:  0 - Asymptomatic  REVIEW OF SYSTEMS:  Patient denies any weight loss, fatigue, weakness, fever, chills or night sweats. Patient denies any loss of vision, blurred vision. Patient denies any ringing  of the ears or hearing loss. No irregular heartbeat. Patient denies heart murmur or history of fainting. Patient denies any chest pain or pain radiating to her upper extremities. Patient denies any shortness of breath, difficulty breathing at night, cough or hemoptysis. Patient denies any swelling in the lower legs. Patient denies any nausea vomiting, vomiting of blood, or coffee ground material in the vomitus. Patient denies any stomach pain. Patient states has had normal bowel movements no significant constipation or diarrhea. Patient denies any dysuria, hematuria or significant nocturia. Patient denies any problems walking, swelling in the joints or loss of balance. Patient denies any skin changes, loss of hair or loss of weight. Patient denies any excessive worrying or anxiety or significant depression. Patient denies any problems with insomnia. Patient denies excessive thirst, polyuria, polydipsia. Patient denies any swollen glands, patient denies easy bruising or easy bleeding. Patient denies any recent infections, allergies or URI. Patient "s visual fields have not changed significantly in recent time.    PHYSICAL EXAM: BP (!) 138/92   Pulse (!) 120   Temp 98 F (36.7 C)   Resp 18   Wt 189 lb 13.1 oz (86.1 kg)   BMI 32.58 kg/m  No  dominant mass or nodularity is noted in either breast in 2 positions examined. There is some vague firmness present in the upper outer quadrant of the right breast. No axillary or supraclavicular adenopathy is identified. Well-developed well-nourished patient in NAD. HEENT reveals PERLA, EOMI, discs not visualized.  Oral cavity is clear. No oral mucosal lesions are identified. Neck is clear without evidence of cervical or supraclavicular adenopathy. Lungs are clear to A&P. Cardiac examination is essentially unremarkable with regular rate and rhythm without murmur rub or thrill. Abdomen is benign with no organomegaly or masses noted. Motor sensory and DTR levels are equal and symmetric in the upper and lower extremities. Cranial nerves II through XII are grossly intact. Proprioception is intact. No peripheral adenopathy or edema is identified. No motor or sensory levels are noted. Crude visual fields are within normal range.  LABORATORY DATA: pathology reports reviewed    RADIOLOGY RESULTS:mammogram and ultrasound reviewed   IMPRESSION: probable early stage invasive mammary carcinoma of the right breast status post neoadjuvant chemotherapy with lumpectomy and sentinel node biopsy pending in 37 year old female  PLAN: at this time like to see final pathology on her wide local excision and sentinel node biopsy before making final recommendations. Patient will finish up her Taxol C surgeon have plan surgeon I  will see her back after that for further recommendations. Risks and benefits of radiation therapy were reviewed with the patient and she seems to comprehend my treatment plan well.  I would like to take this opportunity to thank you for allowing me to participate in the care of your patient.[name], MD

## 2017-11-14 ENCOUNTER — Inpatient Hospital Stay: Payer: 59

## 2017-11-14 ENCOUNTER — Encounter: Payer: Self-pay | Admitting: Oncology

## 2017-11-14 ENCOUNTER — Other Ambulatory Visit: Payer: Self-pay

## 2017-11-14 ENCOUNTER — Inpatient Hospital Stay (HOSPITAL_BASED_OUTPATIENT_CLINIC_OR_DEPARTMENT_OTHER): Payer: 59 | Admitting: Oncology

## 2017-11-14 ENCOUNTER — Inpatient Hospital Stay: Payer: 59 | Attending: Oncology

## 2017-11-14 VITALS — BP 134/82 | HR 128 | Temp 99.2°F | Wt 192.4 lb

## 2017-11-14 DIAGNOSIS — Z170 Estrogen receptor positive status [ER+]: Principal | ICD-10-CM

## 2017-11-14 DIAGNOSIS — Z79899 Other long term (current) drug therapy: Secondary | ICD-10-CM

## 2017-11-14 DIAGNOSIS — R000 Tachycardia, unspecified: Secondary | ICD-10-CM

## 2017-11-14 DIAGNOSIS — C50411 Malignant neoplasm of upper-outer quadrant of right female breast: Secondary | ICD-10-CM | POA: Insufficient documentation

## 2017-11-14 DIAGNOSIS — Z5111 Encounter for antineoplastic chemotherapy: Secondary | ICD-10-CM | POA: Diagnosis present

## 2017-11-14 LAB — COMPREHENSIVE METABOLIC PANEL
ALK PHOS: 56 U/L (ref 38–126)
ALT: 18 U/L (ref 14–54)
AST: 29 U/L (ref 15–41)
Albumin: 3.8 g/dL (ref 3.5–5.0)
Anion gap: 10 (ref 5–15)
BUN: 11 mg/dL (ref 6–20)
CALCIUM: 9.1 mg/dL (ref 8.9–10.3)
CHLORIDE: 102 mmol/L (ref 101–111)
CO2: 24 mmol/L (ref 22–32)
CREATININE: 0.44 mg/dL (ref 0.44–1.00)
GFR calc non Af Amer: >60 mL/min (ref >60)
Glucose, Bld: 149 mg/dL — ABNORMAL HIGH (ref 65–99)
Potassium: 3.6 mmol/L (ref 3.5–5.1)
SODIUM: 136 mmol/L (ref 135–145)
Total Bilirubin: 0.5 mg/dL (ref 0.3–1.2)
Total Protein: 7 g/dL (ref 6.5–8.1)

## 2017-11-14 LAB — CBC WITH DIFFERENTIAL/PLATELET
BASOS PCT: 1 %
Basophils Absolute: 0 10*3/uL (ref 0–0.1)
EOS PCT: 2 %
Eosinophils Absolute: 0.1 10*3/uL (ref 0–0.7)
HCT: 35.6 % (ref 35.0–47.0)
HEMOGLOBIN: 12.4 g/dL (ref 12.0–16.0)
LYMPHS ABS: 1.1 10*3/uL (ref 1.0–3.6)
Lymphocytes Relative: 18 %
MCH: 31.6 pg (ref 26.0–34.0)
MCHC: 34.7 g/dL (ref 32.0–36.0)
MCV: 91.1 fL (ref 80.0–100.0)
MONO ABS: 0.4 10*3/uL (ref 0.2–0.9)
MONOS PCT: 6 %
NEUTROS PCT: 73 %
Neutro Abs: 4.6 10*3/uL (ref 1.4–6.5)
PLATELETS: 335 10*3/uL (ref 150–440)
RBC: 3.91 MIL/uL (ref 3.80–5.20)
RDW: 13.3 % (ref 11.5–14.5)
WBC: 6.2 10*3/uL (ref 3.6–11.0)

## 2017-11-14 MED ORDER — FAMOTIDINE IN NACL 20-0.9 MG/50ML-% IV SOLN
20.0000 mg | Freq: Once | INTRAVENOUS | Status: AC
  Administered 2017-11-14: 20 mg via INTRAVENOUS
  Filled 2017-11-14: qty 50

## 2017-11-14 MED ORDER — DIPHENHYDRAMINE HCL 50 MG/ML IJ SOLN
50.0000 mg | Freq: Once | INTRAMUSCULAR | Status: AC
  Administered 2017-11-14: 50 mg via INTRAVENOUS
  Filled 2017-11-14: qty 1

## 2017-11-14 MED ORDER — HEPARIN SOD (PORK) LOCK FLUSH 100 UNIT/ML IV SOLN
500.0000 [IU] | Freq: Once | INTRAVENOUS | Status: AC
  Administered 2017-11-14: 500 [IU] via INTRAVENOUS
  Filled 2017-11-14: qty 5

## 2017-11-14 MED ORDER — SODIUM CHLORIDE 0.9 % IV SOLN
Freq: Once | INTRAVENOUS | Status: AC
  Administered 2017-11-14: 11:00:00 via INTRAVENOUS
  Filled 2017-11-14: qty 1000

## 2017-11-14 MED ORDER — SODIUM CHLORIDE 0.9% FLUSH
10.0000 mL | Freq: Once | INTRAVENOUS | Status: AC
  Administered 2017-11-14: 10 mL via INTRAVENOUS
  Filled 2017-11-14: qty 10

## 2017-11-14 MED ORDER — SODIUM CHLORIDE 0.9 % IV SOLN
20.0000 mg | Freq: Once | INTRAVENOUS | Status: AC
  Administered 2017-11-14: 20 mg via INTRAVENOUS
  Filled 2017-11-14: qty 2

## 2017-11-14 MED ORDER — PACLITAXEL CHEMO INJECTION 300 MG/50ML
80.0000 mg/m2 | Freq: Once | INTRAVENOUS | Status: AC
  Administered 2017-11-14: 150 mg via INTRAVENOUS
  Filled 2017-11-14: qty 25

## 2017-11-14 NOTE — Progress Notes (Signed)
Hematology/Oncology Follow up note Anne Wu Telephone:(336) [phone] Fax:(336) [phone]   Patient Care Team: [name], NP as PCP - General (Nurse Practitioner)  REASON FOR VISIT Follow up for  chemotherapy management of breast cancer   HISTORY OF PRESENTING ILLNESS:  Anne Wu is a  37 y.o.  female with diagnosis of cT2N0M0, grade 2 invasive mammary carcinoma.  She has significant family history on both maternal side as well as paternal side. She felt a right breast mass 2 months ago and diagnostic mammogram showed right upper quandrant 2.2 cm mass, which was confirmed on [place] and biopsied. Right axillary no clinically suspicious lymph node. Pathology showed invasive mammary carcinoma, ER/PR positive,, HER2 IHC  equivocal, FISH negative.   # Patient was evaluated by Dr.Smith. From surgical standpoint, Dr.Smith recommend neoadjuvant chemotherapy to facilitate surgery to obtain negative margin and better surgical outcome. Also given patient's young age, neoadjuvant chemotherapy is reasonable.    #Patient stated that she does not desire future fertility.  # 2D Echo showed normal systolic function, mild mitral regurgitation.   # Testing did not reveal any pathogenic mutation in any of these genes. A copy of the genetic test report will be scanned into Epic under the Media tab.The genes analyzed were the 23 genes on [facility]'s Breast/GYN panel (ATM, BARD1, BRCA1, BRCA2, BRIP1, CDH1, CHEK2, DICER1, EPCAM, MLH1,  MSH2, MSH6, NBN, NF1, PALB2, PMS2, PTEN, RAD50, RAD51C, RAD51D,SMARCA4, STK11, and TP53).  09/08/2017 Interval mammogram after 4 cycle of AC, showed good treatment response. The biopsy-proven malignancy in the upper outer quadrant of the right breast at posterior depth, associated with scattered microcalcifications and architectural distortion, has significantly decreased in size since the mammogram 06/20/2017. On today's mammogram, the mass measures  approximately 1.7 x 2.2 x 1.2 cm (previously 2.4 x 1.6 x 2.7 cm).   INTERVAL HISTORY Anne Wu is a 37 y.o. female who has above history reviewed by me today presents for evaluation prior to cycle 10 neoadjuvant weekly Taxol for treatment of breast cancer. (cT2cN0cM0).  She continues to do pretty well.  Denies any nausea vomiting, pain, pain fatigue, tingling or numbness.  Today in the clinic, her heart rate was at  128.   she admits to occasional palpitations.  Patient denies feeling any palpitation, chest pain, shortness of breath. She has met Dr. Peyton Najjar surgery and Dr. Donella Stade radiation oncology.    Review of Systems  Constitutional: Negative for chills, fever, malaise/fatigue and weight loss.  HENT: Negative for congestion, ear discharge, ear pain, hearing loss, nosebleeds, sinus pain and sore throat.   Eyes: Negative for double vision, photophobia, pain, discharge and redness.  Respiratory: Negative for cough, hemoptysis, sputum production, shortness of breath and wheezing.   Cardiovascular: Positive for palpitations. Negative for chest pain, orthopnea, claudication and leg swelling.  Gastrointestinal: Negative for abdominal pain, blood in stool, constipation, diarrhea, heartburn, melena, nausea and vomiting.  Genitourinary: Negative for dysuria, flank pain, frequency, hematuria and urgency.  Musculoskeletal: Negative for back pain, myalgias and neck pain.  Skin: Negative for itching and rash.  Neurological: Negative for dizziness, tingling, tremors, sensory change, focal weakness, weakness and headaches.  Endo/Heme/Allergies: Negative for environmental allergies and polydipsia. Does not bruise/bleed easily.  Psychiatric/Behavioral: Negative for depression, hallucinations, substance abuse and suicidal ideas. The patient is not nervous/anxious.     MEDICAL HISTORY:  Past Medical History:  Diagnosis Date  . Asthma    AS A CHILD-NO INHALERS  . Breast cancer ([place])   . Elevated blood  pressure, situational    PT STATES HER LAST COUPLE OF MD APPOINTMENTS SHE HAS HAD ELEVATED BP-NEVER HAD A PROBLEM WITH THIS PREVIOUSLY  . Genetic testing 09/05/2017   Breast/GYN panel (23 genes) @ [facility] - No pathogenic mutations detected  . GERD (gastroesophageal reflux disease)    OCC- NO MEDS  . Personal history of chemotherapy     SURGICAL HISTORY: Past Surgical History:  Procedure Laterality Date  . BREAST BIOPSY Right 06/20/2017   Invasive Mammary Carcinoma  . NO PAST SURGERIES    . PORTACATH PLACEMENT N/A 07/11/2017   Procedure: INSERTION PORT-A-CATH;  Surgeon: Leonie Green, MD;  Location: [facility] ORS;  Service: General;  Laterality: N/A;    SOCIAL HISTORY: Social History   Socioeconomic History  . Marital status: Married    Spouse name: Not on file  . Number of children: Not on file  . Years of education: Not on file  . Highest education level: Not on file  Occupational History  . Not on file  Social Needs  . Financial resource strain: Not on file  . Food insecurity:    Worry: Not on file    Inability: Not on file  . Transportation needs:    Medical: Not on file    Non-medical: Not on file  Tobacco Use  . Smoking status: Never Smoker  . Smokeless tobacco: Never Used  Substance and Sexual Activity  . Alcohol use: No    Alcohol/week: 0.0 oz  . Drug use: No  . Sexual activity: Yes  Lifestyle  . Physical activity:    Days per week: Not on file    Minutes per session: Not on file  . Stress: Not on file  Relationships  . Social connections:    Talks on phone: Not on file    Gets together: Not on file    Attends religious service: Not on file    Active member of club or organization: Not on file    Attends meetings of clubs or organizations: Not on file    Relationship status: Not on file  . Intimate partner violence:    Fear of current or ex partner: Not on file    Emotionally abused: Not on file    Physically abused: Not on file    Forced sexual  activity: Not on file  Other Topics Concern  . Not on file  Social History Narrative  . Not on file    FAMILY HISTORY: Family History  Problem Relation Age of Onset  . Breast cancer Maternal Aunt 29       currently late 51s  . Breast cancer Maternal Grandmother 68       second breast vs. recurrence ca at 23; deceased at 72  . Colon cancer Maternal Uncle 35       deceased in 39s  . Breast cancer Paternal Aunt        age at dx unknown; currently in 54s  . Breast cancer Paternal Grandmother        age at dx unknown  . Melanoma Mother        on leg  . Testicular cancer Maternal Uncle 4       currently 51s    ALLERGIES:  is allergic to codeine.  MEDICATIONS:  Current Outpatient Medications  Medication Sig Dispense Refill  . acetaminophen (TYLENOL) 325 MG tablet Take 650 mg by mouth every 6 (six) hours as needed (for pain.).     Marland Kitchen chlorhexidine (PERIDEX) 0.12 % solution  Use as directed 15 mLs in the mouth or throat 2 (two) times daily. 473 mL 0  . HYDROcodone-acetaminophen (NORCO) 5-325 MG tablet Take 1-2 tablets by mouth every 6 (six) hours as needed for moderate pain. 6 tablet 0  . ibuprofen (ADVIL,MOTRIN) 200 MG tablet Take 400 mg by mouth every 8 (eight) hours as needed (for pain.).    Marland Kitchen lidocaine-prilocaine (EMLA) cream Apply to affected area once 30 g 3  . loratadine (CLARITIN) 10 MG tablet Take 10 mg by mouth daily. One tablet 3 days after treatment    . Multiple Vitamin (MULTI-VITAMINS) TABS Take by mouth.    . ondansetron (ZOFRAN) 8 MG tablet Take 1 tablet (8 mg total) by mouth 2 (two) times daily as needed. Start on the third day after chemotherapy. 30 tablet 1  . prochlorperazine (COMPAZINE) 10 MG tablet Take 1 tablet (10 mg total) by mouth every 6 (six) hours as needed (Nausea or vomiting). 30 tablet 1   No current facility-administered medications for this visit.    Facility-Administered Medications Ordered in Other Visits  Medication Dose Route Frequency Provider  Last Rate Last Dose  . heparin lock flush 100 unit/mL  500 Units Intravenous Once [name], MD      . sodium chloride flush (NS) 0.9 % injection 10 mL  10 mL Intravenous PRN [name], MD   10 mL at 08/01/17 0829     PHYSICAL EXAMINATION: ECOG PERFORMANCE STATUS: 0 - Asymptomatic Vitals:   11/14/17 1301  BP: 134/82  Pulse: (!) 128  Temp: 99.2 F (37.3 C)   Filed Weights   11/14/17 1301  Weight: 192 lb 6 oz (87.3 kg)    Physical Exam  Constitutional: She is oriented to person, place, and time and well-developed, well-nourished, and in no distress. No distress.  HENT:  Head: Normocephalic and atraumatic.  Right Ear: External ear normal.  Left Ear: External ear normal.  Nose: Nose normal.  Mouth/Throat: Oropharynx is clear and moist. No oropharyngeal exudate.  Eyes: Pupils are equal, round, and reactive to light. EOM are normal. Right eye exhibits no discharge. Left eye exhibits no discharge. No scleral icterus.  Neck: Normal range of motion. Neck supple. No JVD present.  Cardiovascular: Normal rate, regular rhythm, normal heart sounds and intact distal pulses. Exam reveals no friction rub.  No murmur heard. Pulmonary/Chest: Effort normal and breath sounds normal. No respiratory distress. She has no wheezes. She has no rales. She exhibits no tenderness.  Abdominal: Soft. Bowel sounds are normal. She exhibits no distension and no mass. There is no tenderness. There is no rebound and no guarding.  Musculoskeletal: Normal range of motion. She exhibits no edema, tenderness or deformity.  Lymphadenopathy:    She has no cervical adenopathy.  Neurological: She is alert and oriented to person, place, and time. No cranial nerve deficit. She exhibits normal muscle tone. Coordination normal.  Skin: Skin is warm and dry. No rash noted. She is not diaphoretic. No erythema. No pallor.  Psychiatric: Memory, affect and judgment normal.  Breast exam was performed in seated and lying down  position. No nipple discharge. Right breast 10'oclock palpable mobile mass not palpable.bilateraly axillary lymph nodes not palpable.       Lab Results  Component Value Date   WBC 6.2 11/14/2017   HGB 12.4 11/14/2017   HCT 35.6 11/14/2017   MCV 91.1 11/14/2017   PLT 335 11/14/2017   Recent Labs    10/31/17 0846 11/07/17 0920 11/14/17 0841  NA  135 136 136  K 4.1 3.8 3.6  CL 101 103 102  CO2 _0 GLUCOSE 95 136* 149*  BUN _1 CREATININE 0.53 0.57 0.44  CALCIUM 9.3 9.1 9.1  GFRNONAA >60 >60 >60  GFRAA >60 >60 >60  PROT 7.3 7.1 7.0  ALBUMIN 4.0 3.8 3.8  AST _2 ALT _3 ALKPHOS 54 49 56  BILITOT 0.6 0.5 0.5    2D Echo was done which showed LVEF 55-65%, mild mitral valve regurgitation.    ASSESSMENT & PLAN:  1. Malignant neoplasm of upper-outer quadrant of right breast in female, estrogen receptor positive ([place])   2. Tachycardia    #cT2N0M0, grade 2 Patient tolerates chemotherapy well without significant side effects.  # Tachycardia: Regular sinus rhythm on EKG.  Heart rate decreased to 113.  Patient is asymptomatic.  Plan repeat 2D echocardiogram. Proceed with Cycle 10 Taxol today and she will have CBC CMP, urine pregnancy test prior to cycle 11 of chemotherapy.  Plan repeat mammogram after she finishes 12 cycles of Taxol.   #  All questions were answered. The patient knows to call the clinic with any problems questions or concerns.  Return visit: Labs prior to  cycle 11 taxol and see me in 2 weeks prior to cycle 12 Taxol.   [name], MD, PhD Hematology Oncology [place] [facility] Pager- [phone] 11/14/2017

## 2017-11-14 NOTE — Progress Notes (Signed)
Patient here today for follow up.  Patient states no new concerns today  

## 2017-11-17 ENCOUNTER — Ambulatory Visit
Admission: RE | Admit: 2017-11-17 | Discharge: 2017-11-17 | Disposition: A | Discharge status: Home / Self Care | Payer: 59 | Source: Ambulatory Visit | Attending: Oncology | Admitting: Oncology

## 2017-11-17 DIAGNOSIS — Z853 Personal history of malignant neoplasm of breast: Secondary | ICD-10-CM | POA: Diagnosis not present

## 2017-11-17 DIAGNOSIS — J45909 Unspecified asthma, uncomplicated: Secondary | ICD-10-CM | POA: Insufficient documentation

## 2017-11-17 DIAGNOSIS — Z170 Estrogen receptor positive status [ER+]: Secondary | ICD-10-CM | POA: Diagnosis not present

## 2017-11-17 DIAGNOSIS — K219 Gastro-esophageal reflux disease without esophagitis: Secondary | ICD-10-CM | POA: Diagnosis not present

## 2017-11-17 DIAGNOSIS — C50411 Malignant neoplasm of upper-outer quadrant of right female breast: Secondary | ICD-10-CM | POA: Diagnosis not present

## 2017-11-17 DIAGNOSIS — Z9221 Personal history of antineoplastic chemotherapy: Secondary | ICD-10-CM | POA: Insufficient documentation

## 2017-11-17 LAB — PREGNANCY, URINE: Preg Test, Ur: NEGATIVE

## 2017-11-17 NOTE — Progress Notes (Signed)
*  PRELIMINARY RESULTS* Echocardiogram 2D Echocardiogram has been performed.  Anne Wu 11/17/2017, 10:52 AM

## 2017-11-21 ENCOUNTER — Inpatient Hospital Stay: Payer: 59

## 2017-11-21 VITALS — BP 132/90 | HR 108 | Temp 98.6°F | Resp 19

## 2017-11-21 DIAGNOSIS — Z5111 Encounter for antineoplastic chemotherapy: Secondary | ICD-10-CM | POA: Diagnosis not present

## 2017-11-21 DIAGNOSIS — C50411 Malignant neoplasm of upper-outer quadrant of right female breast: Secondary | ICD-10-CM

## 2017-11-21 DIAGNOSIS — Z170 Estrogen receptor positive status [ER+]: Principal | ICD-10-CM

## 2017-11-21 LAB — COMPREHENSIVE METABOLIC PANEL
ALK PHOS: 56 U/L (ref 38–126)
ALT: 18 U/L (ref 14–54)
ANION GAP: 11 (ref 5–15)
AST: 24 U/L (ref 15–41)
Albumin: 3.9 g/dL (ref 3.5–5.0)
BUN: 10 mg/dL (ref 6–20)
CALCIUM: 9.4 mg/dL (ref 8.9–10.3)
CHLORIDE: 101 mmol/L (ref 101–111)
CO2: 24 mmol/L (ref 22–32)
Creatinine, Ser: 0.64 mg/dL (ref 0.44–1.00)
GFR calc non Af Amer: >60 mL/min (ref >60)
GLUCOSE: 88 mg/dL (ref 65–99)
Potassium: 4.3 mmol/L (ref 3.5–5.1)
SODIUM: 136 mmol/L (ref 135–145)
Total Bilirubin: 0.5 mg/dL (ref 0.3–1.2)
Total Protein: 7.4 g/dL (ref 6.5–8.1)

## 2017-11-21 LAB — CBC WITH DIFFERENTIAL/PLATELET
BASOS PCT: 1 %
Basophils Absolute: 0.1 10*3/uL (ref 0–0.1)
Eosinophils Absolute: 0.1 10*3/uL (ref 0–0.7)
Eosinophils Relative: 2 %
HEMATOCRIT: 36 % (ref 35.0–47.0)
HEMOGLOBIN: 12.6 g/dL (ref 12.0–16.0)
LYMPHS ABS: 1.3 10*3/uL (ref 1.0–3.6)
LYMPHS PCT: 18 %
MCH: 31.7 pg (ref 26.0–34.0)
MCHC: 34.9 g/dL (ref 32.0–36.0)
MCV: 90.8 fL (ref 80.0–100.0)
MONOS PCT: 8 %
Monocytes Absolute: 0.6 10*3/uL (ref 0.2–0.9)
NEUTROS ABS: 5.1 10*3/uL (ref 1.4–6.5)
NEUTROS PCT: 71 %
Platelets: 390 10*3/uL (ref 150–440)
RBC: 3.96 MIL/uL (ref 3.80–5.20)
RDW: 13.1 % (ref 11.5–14.5)
WBC: 7.2 10*3/uL (ref 3.6–11.0)

## 2017-11-21 LAB — PREGNANCY, URINE: PREG TEST UR: NEGATIVE

## 2017-11-21 MED ORDER — SODIUM CHLORIDE 0.9 % IV SOLN
80.0000 mg/m2 | Freq: Once | INTRAVENOUS | Status: AC
  Administered 2017-11-21: 150 mg via INTRAVENOUS
  Filled 2017-11-21: qty 25

## 2017-11-21 MED ORDER — HEPARIN SOD (PORK) LOCK FLUSH 100 UNIT/ML IV SOLN
500.0000 [IU] | Freq: Once | INTRAVENOUS | Status: AC
  Administered 2017-11-21: 500 [IU] via INTRAVENOUS
  Filled 2017-11-21: qty 5

## 2017-11-21 MED ORDER — SODIUM CHLORIDE 0.9 % IV SOLN
20.0000 mg | Freq: Once | INTRAVENOUS | Status: AC
  Administered 2017-11-21: 20 mg via INTRAVENOUS
  Filled 2017-11-21: qty 2

## 2017-11-21 MED ORDER — SODIUM CHLORIDE 0.9% FLUSH
10.0000 mL | Freq: Once | INTRAVENOUS | Status: DC
  Filled 2017-11-21: qty 10

## 2017-11-21 MED ORDER — SODIUM CHLORIDE 0.9% FLUSH
10.0000 mL | Freq: Once | INTRAVENOUS | Status: AC
  Administered 2017-11-21: 10 mL via INTRAVENOUS
  Filled 2017-11-21: qty 10

## 2017-11-21 MED ORDER — DIPHENHYDRAMINE HCL 50 MG/ML IJ SOLN
50.0000 mg | Freq: Once | INTRAMUSCULAR | Status: AC
  Administered 2017-11-21: 50 mg via INTRAVENOUS
  Filled 2017-11-21: qty 1

## 2017-11-21 MED ORDER — FAMOTIDINE IN NACL 20-0.9 MG/50ML-% IV SOLN
20.0000 mg | Freq: Once | INTRAVENOUS | Status: AC
  Administered 2017-11-21: 20 mg via INTRAVENOUS
  Filled 2017-11-21: qty 50

## 2017-11-21 MED ORDER — SODIUM CHLORIDE 0.9 % IV SOLN
Freq: Once | INTRAVENOUS | Status: AC
  Administered 2017-11-21: 14:00:00 via INTRAVENOUS
  Filled 2017-11-21: qty 1000

## 2017-11-28 ENCOUNTER — Inpatient Hospital Stay: Payer: 59

## 2017-11-28 ENCOUNTER — Encounter: Payer: Self-pay | Admitting: Oncology

## 2017-11-28 ENCOUNTER — Inpatient Hospital Stay (HOSPITAL_BASED_OUTPATIENT_CLINIC_OR_DEPARTMENT_OTHER): Payer: 59 | Admitting: Oncology

## 2017-11-28 ENCOUNTER — Other Ambulatory Visit: Payer: Self-pay

## 2017-11-28 VITALS — BP 130/83 | HR 110 | Temp 98.4°F | Resp 16 | Wt 193.0 lb

## 2017-11-28 DIAGNOSIS — Z5111 Encounter for antineoplastic chemotherapy: Secondary | ICD-10-CM | POA: Diagnosis not present

## 2017-11-28 DIAGNOSIS — Z170 Estrogen receptor positive status [ER+]: Secondary | ICD-10-CM

## 2017-11-28 DIAGNOSIS — Z79899 Other long term (current) drug therapy: Secondary | ICD-10-CM

## 2017-11-28 DIAGNOSIS — C50411 Malignant neoplasm of upper-outer quadrant of right female breast: Secondary | ICD-10-CM

## 2017-11-28 DIAGNOSIS — R000 Tachycardia, unspecified: Secondary | ICD-10-CM

## 2017-11-28 LAB — CBC WITH DIFFERENTIAL/PLATELET
BASOS ABS: 0.1 10*3/uL (ref 0–0.1)
Basophils Relative: 1 %
EOS ABS: 0.1 10*3/uL (ref 0–0.7)
EOS PCT: 2 %
HCT: 35.6 % (ref 35.0–47.0)
Hemoglobin: 12.6 g/dL (ref 12.0–16.0)
Lymphocytes Relative: 18 %
Lymphs Abs: 1.1 10*3/uL (ref 1.0–3.6)
MCH: 31.7 pg (ref 26.0–34.0)
MCHC: 35.4 g/dL (ref 32.0–36.0)
MCV: 89.6 fL (ref 80.0–100.0)
Monocytes Absolute: 0.4 10*3/uL (ref 0.2–0.9)
Monocytes Relative: 7 %
Neutro Abs: 4.5 10*3/uL (ref 1.4–6.5)
Neutrophils Relative %: 72 %
PLATELETS: 347 10*3/uL (ref 150–440)
RBC: 3.98 MIL/uL (ref 3.80–5.20)
RDW: 13.1 % (ref 11.5–14.5)
WBC: 6.3 10*3/uL (ref 3.6–11.0)

## 2017-11-28 LAB — TSH: TSH: 3.508 u[IU]/mL (ref 0.350–4.500)

## 2017-11-28 LAB — COMPREHENSIVE METABOLIC PANEL
ALT: 24 U/L (ref 14–54)
AST: 26 U/L (ref 15–41)
Albumin: 3.8 g/dL (ref 3.5–5.0)
Alkaline Phosphatase: 54 U/L (ref 38–126)
Anion gap: 10 (ref 5–15)
BILIRUBIN TOTAL: 0.5 mg/dL (ref 0.3–1.2)
BUN: 10 mg/dL (ref 6–20)
CO2: 24 mmol/L (ref 22–32)
CREATININE: 0.51 mg/dL (ref 0.44–1.00)
Calcium: 9.3 mg/dL (ref 8.9–10.3)
Chloride: 101 mmol/L (ref 101–111)
GFR calc Af Amer: >60 mL/min (ref >60)
Glucose, Bld: 94 mg/dL (ref 65–99)
Potassium: 4.2 mmol/L (ref 3.5–5.1)
Sodium: 135 mmol/L (ref 135–145)
TOTAL PROTEIN: 7.1 g/dL (ref 6.5–8.1)

## 2017-11-28 LAB — PREGNANCY, URINE: Preg Test, Ur: NEGATIVE

## 2017-11-28 MED ORDER — DIPHENHYDRAMINE HCL 50 MG/ML IJ SOLN
50.0000 mg | Freq: Once | INTRAMUSCULAR | Status: AC
  Administered 2017-11-28: 50 mg via INTRAVENOUS
  Filled 2017-11-28: qty 1

## 2017-11-28 MED ORDER — SODIUM CHLORIDE 0.9 % IV SOLN
80.0000 mg/m2 | Freq: Once | INTRAVENOUS | Status: AC
  Administered 2017-11-28: 150 mg via INTRAVENOUS
  Filled 2017-11-28: qty 25

## 2017-11-28 MED ORDER — SODIUM CHLORIDE 0.9 % IV SOLN
Freq: Once | INTRAVENOUS | Status: AC
  Administered 2017-11-28: 10:00:00 via INTRAVENOUS
  Filled 2017-11-28: qty 1000

## 2017-11-28 MED ORDER — HEPARIN SOD (PORK) LOCK FLUSH 100 UNIT/ML IV SOLN
500.0000 [IU] | Freq: Once | INTRAVENOUS | Status: AC
  Administered 2017-11-28: 500 [IU] via INTRAVENOUS
  Filled 2017-11-28: qty 5

## 2017-11-28 MED ORDER — SODIUM CHLORIDE 0.9% FLUSH
10.0000 mL | INTRAVENOUS | Status: DC | PRN
  Administered 2017-11-28: 10 mL via INTRAVENOUS
  Filled 2017-11-28: qty 10

## 2017-11-28 MED ORDER — SODIUM CHLORIDE 0.9 % IV SOLN
20.0000 mg | Freq: Once | INTRAVENOUS | Status: AC
  Administered 2017-11-28: 20 mg via INTRAVENOUS
  Filled 2017-11-28: qty 2

## 2017-11-28 MED ORDER — FAMOTIDINE IN NACL 20-0.9 MG/50ML-% IV SOLN
20.0000 mg | Freq: Once | INTRAVENOUS | Status: AC
  Administered 2017-11-28: 20 mg via INTRAVENOUS
  Filled 2017-11-28: qty 50

## 2017-11-28 NOTE — Progress Notes (Signed)
Hematology/Oncology Follow up note [facility] Telephone:([phone] Fax:(336) [phone]   Patient Care Team: [name], NP as PCP - General (Nurse Practitioner)  REASON FOR VISIT Follow up for  chemotherapy management of breast cancer   HISTORY OF PRESENTING ILLNESS:  Anne Wu is a  37 y.o.  female with diagnosis of cT2N0M0, grade 2 invasive mammary carcinoma.  She has significant family history on both maternal side as well as paternal side. She felt a right breast mass 2 months ago and diagnostic mammogram showed right upper quandrant 2.2 cm mass, which was confirmed on [place] and biopsied. Right axillary no clinically suspicious lymph node. Pathology showed invasive mammary carcinoma, ER/PR positive,, HER2 IHC  equivocal, FISH negative.   # Patient was evaluated by Dr.Smith. From surgical standpoint, Dr.Smith recommend neoadjuvant chemotherapy to facilitate surgery to obtain negative margin and better surgical outcome. Also given patient's young age, neoadjuvant chemotherapy is reasonable.    #Patient stated that she does not desire future fertility.  # 2D Echo showed normal systolic function, mild mitral regurgitation.   # Testing did not reveal any pathogenic mutation in any of these genes. A copy of the genetic test report will be scanned into Epic under the Media tab.The genes analyzed were the 23 genes on [facility]'s Breast/GYN panel (ATM, BARD1, BRCA1, BRCA2, BRIP1, CDH1, CHEK2, DICER1, EPCAM, MLH1,  MSH2, MSH6, NBN, NF1, PALB2, PMS2, PTEN, RAD50, RAD51C, RAD51D,SMARCA4, STK11, and TP53).  09/08/2017 Interval mammogram after 4 cycle of AC, showed good treatment response. The biopsy-proven malignancy in the upper outer quadrant of the right breast at posterior depth, associated with scattered microcalcifications and architectural distortion, has significantly decreased in size since the mammogram 06/20/2017. On today's mammogram, the mass measures  approximately 1.7 x 2.2 x 1.2 cm (previously 2.4 x 1.6 x 2.7 cm).   INTERVAL HISTORY Anne Wu is a 37 y.o. female who has above history reviewed by me today presents for evaluation prior to cycle 12 neoadjuvant weekly Taxol for treatment of breast cancer. (cT2cN0cM0).   She continues to do well. Denies nausea/vomiting, diarrhea or numbing sensation, denies palpitation. Heart rate continues to be high in 110s, asymptomatic.  2 D echo was done during the interval.     Review of Systems  Constitutional: Negative for chills, fever, malaise/fatigue and weight loss.  HENT: Negative for congestion, ear discharge, ear pain, hearing loss, nosebleeds, sinus pain and sore throat.   Eyes: Negative for double vision, photophobia, pain, discharge and redness.  Respiratory: Negative for cough, hemoptysis, sputum production, shortness of breath and wheezing.   Cardiovascular: Negative for chest pain, palpitations, orthopnea, claudication and leg swelling.  Gastrointestinal: Negative for abdominal pain, blood in stool, constipation, diarrhea, heartburn, melena, nausea and vomiting.  Genitourinary: Negative for dysuria, flank pain, frequency, hematuria and urgency.  Musculoskeletal: Negative for back pain, myalgias and neck pain.  Skin: Negative for itching and rash.  Neurological: Negative for dizziness, tingling, tremors, sensory change, focal weakness, weakness and headaches.  Endo/Heme/Allergies: Negative for environmental allergies and polydipsia. Does not bruise/bleed easily.  Psychiatric/Behavioral: Negative for depression, hallucinations, substance abuse and suicidal ideas. The patient is not nervous/anxious.     MEDICAL HISTORY:  Past Medical History:  Diagnosis Date  . Asthma    AS A CHILD-NO INHALERS  . Breast cancer ([place])   . Elevated blood pressure, situational    PT STATES HER LAST COUPLE OF MD APPOINTMENTS SHE HAS HAD ELEVATED BP-NEVER HAD A PROBLEM WITH THIS PREVIOUSLY  . Genetic  testing 09/05/2017   Breast/GYN panel (23 genes) @ [facility] - No pathogenic mutations detected  . GERD (gastroesophageal reflux disease)    OCC- NO MEDS  . Personal history of chemotherapy     SURGICAL HISTORY: Past Surgical History:  Procedure Laterality Date  . BREAST BIOPSY Right 06/20/2017   Invasive Mammary Carcinoma  . NO PAST SURGERIES    . PORTACATH PLACEMENT N/A 07/11/2017   Procedure: INSERTION PORT-A-CATH;  Surgeon: Leonie Green, MD;  Location: [facility] ORS;  Service: General;  Laterality: N/A;    SOCIAL HISTORY: Social History   Socioeconomic History  . Marital status: Married    Spouse name: Not on file  . Number of children: Not on file  . Years of education: Not on file  . Highest education level: Not on file  Occupational History  . Not on file  Social Needs  . Financial resource strain: Not on file  . Food insecurity:    Worry: Not on file    Inability: Not on file  . Transportation needs:    Medical: Not on file    Non-medical: Not on file  Tobacco Use  . Smoking status: Never Smoker  . Smokeless tobacco: Never Used  Substance and Sexual Activity  . Alcohol use: No    Alcohol/week: 0.0 oz  . Drug use: No  . Sexual activity: Yes  Lifestyle  . Physical activity:    Days per week: Not on file    Minutes per session: Not on file  . Stress: Not on file  Relationships  . Social connections:    Talks on phone: Not on file    Gets together: Not on file    Attends religious service: Not on file    Active member of club or organization: Not on file    Attends meetings of clubs or organizations: Not on file    Relationship status: Not on file  . Intimate partner violence:    Fear of current or ex partner: Not on file    Emotionally abused: Not on file    Physically abused: Not on file    Forced sexual activity: Not on file  Other Topics Concern  . Not on file  Social History Narrative  . Not on file    FAMILY HISTORY: Family History    Problem Relation Age of Onset  . Breast cancer Maternal Aunt 22       currently late 30s  . Breast cancer Maternal Grandmother 68       second breast vs. recurrence ca at 61; deceased at 33  . Colon cancer Maternal Uncle 78       deceased in 41s  . Breast cancer Paternal Aunt        age at dx unknown; currently in 69s  . Breast cancer Paternal Grandmother        age at dx unknown  . Melanoma Mother        on leg  . Testicular cancer Maternal Uncle 71       currently 56s    ALLERGIES:  is allergic to codeine.  MEDICATIONS:  Current Outpatient Medications  Medication Sig Dispense Refill  . acetaminophen (TYLENOL) 325 MG tablet Take 650 mg by mouth every 6 (six) hours as needed (for pain.).     Marland Kitchen chlorhexidine (PERIDEX) 0.12 % solution Use as directed 15 mLs in the mouth or throat 2 (two) times daily. 473 mL 0  . HYDROcodone-acetaminophen (NORCO) 5-325 MG tablet Take 1-2  tablets by mouth every 6 (six) hours as needed for moderate pain. 6 tablet 0  . ibuprofen (ADVIL,MOTRIN) 200 MG tablet Take 400 mg by mouth every 8 (eight) hours as needed (for pain.).    Marland Kitchen lidocaine-prilocaine (EMLA) cream Apply to affected area once 30 g 3  . loratadine (CLARITIN) 10 MG tablet Take 10 mg by mouth daily. One tablet 3 days after treatment    . Multiple Vitamin (MULTI-VITAMINS) TABS Take by mouth.    . ondansetron (ZOFRAN) 8 MG tablet Take 1 tablet (8 mg total) by mouth 2 (two) times daily as needed. Start on the third day after chemotherapy. 30 tablet 1  . prochlorperazine (COMPAZINE) 10 MG tablet Take 1 tablet (10 mg total) by mouth every 6 (six) hours as needed (Nausea or vomiting). 30 tablet 1   No current facility-administered medications for this visit.    Facility-Administered Medications Ordered in Other Visits  Medication Dose Route Frequency Provider Last Rate Last Dose  . heparin lock flush 100 unit/mL  500 Units Intravenous Once [name], MD      . heparin lock flush 100 unit/mL  500  Units Intravenous Once [name], MD      . sodium chloride flush (NS) 0.9 % injection 10 mL  10 mL Intravenous PRN [name], MD   10 mL at 08/01/17 0829  . sodium chloride flush (NS) 0.9 % injection 10 mL  10 mL Intravenous PRN [name], MD   10 mL at 11/28/17 8756     PHYSICAL EXAMINATION: ECOG PERFORMANCE STATUS: 0 - Asymptomatic There were no vitals filed for this visit. There were no vitals filed for this visit.  Physical Exam  Constitutional: She is oriented to person, place, and time and well-developed, well-nourished, and in no distress. No distress.  HENT:  Head: Normocephalic and atraumatic.  Right Ear: External ear normal.  Left Ear: External ear normal.  Nose: Nose normal.  Mouth/Throat: Oropharynx is clear and moist. No oropharyngeal exudate.  Eyes: Pupils are equal, round, and reactive to light. EOM are normal. Right eye exhibits no discharge. Left eye exhibits no discharge. No scleral icterus.  Neck: Normal range of motion. Neck supple. No JVD present.  Cardiovascular: Normal rate, regular rhythm, normal heart sounds and intact distal pulses. Exam reveals no friction rub.  No murmur heard. Pulmonary/Chest: Effort normal and breath sounds normal. No respiratory distress. She has no wheezes. She has no rales. She exhibits no tenderness.  Abdominal: Soft. Bowel sounds are normal. She exhibits no distension and no mass. There is no tenderness. There is no rebound and no guarding.  Musculoskeletal: Normal range of motion. She exhibits no edema, tenderness or deformity.  Lymphadenopathy:    She has no cervical adenopathy.  Neurological: She is alert and oriented to person, place, and time. No cranial nerve deficit. She exhibits normal muscle tone. Coordination normal.  Skin: Skin is warm and dry. No rash noted. She is not diaphoretic. No erythema. No pallor.  Psychiatric: Memory, affect and judgment normal.  Breast exam was performed in seated and lying down position. No  nipple discharge. Right breast 10'oclock palpable mobile mass not palpable.bilateraly axillary lymph nodes not palpable.       Lab Results  Component Value Date   WBC 6.3 11/28/2017   HGB 12.6 11/28/2017   HCT 35.6 11/28/2017   MCV 89.6 11/28/2017   PLT 347 11/28/2017   Recent Labs    11/14/17 0841 11/21/17 1321 11/28/17 0827  NA 136  136 135  K 3.6 4.3 4.2  CL 102 101 101  CO2 '24 24 24  ' GLUCOSE 149* 88 94  BUN '11 10 10  ' CREATININE 0.44 0.64 0.51  CALCIUM 9.1 9.4 9.3  GFRNONAA >60 >60 >60  GFRAA >60 >60 >60  PROT 7.0 7.4 7.1  ALBUMIN 3.8 3.9 3.8  AST '29 24 26  ' ALT '18 18 24  ' ALKPHOS 56 56 54  BILITOT 0.5 0.5 0.5    2D Echo was done which showed LVEF 55-65%, mild mitral valve regurgitation.    ASSESSMENT & PLAN:  1. Malignant neoplasm of upper-outer quadrant of right breast in female, estrogen receptor positive ([place])   2. Tachycardia   3. Encounter for antineoplastic chemotherapy    #cT2N0M0, grade 2 breast cancer, ER/PR positive, HER 2 negative. Clinical exam showed improvement of right breast mass, tolerates chemotherapy well. Proceed with last dose of Taxol.   # Tachycardia: Regular sinus rhythm on EKG.  Mild tachycardia and remains asymptomatic. 2D echo results were reviewed with patient. Mild mitral valve regurgitation, normal LVEF. Tachycardia is most likely due to anxiety.   # plan repeat diagnostic mammogram and patient to see Dr.Cintron for pre-operative evaluation.   #  All questions were answered. The patient knows to call the clinic with any problems questions or concerns.  Return visit: To be determined.    [name], MD, PhD Hematology Oncology [facility] Pager- [phone] 11/28/2017

## 2017-11-28 NOTE — Progress Notes (Signed)
Patient here today for follow up.  Patient states no new concerns today  

## 2017-11-28 NOTE — Progress Notes (Signed)
Heart rate 110. Per Dr Tasia Catchings okay to proceed with treatment

## 2017-12-10 ENCOUNTER — Other Ambulatory Visit: Payer: Self-pay | Admitting: General Surgery

## 2017-12-10 DIAGNOSIS — Z170 Estrogen receptor positive status [ER+]: Secondary | ICD-10-CM

## 2017-12-10 DIAGNOSIS — R928 Other abnormal and inconclusive findings on diagnostic imaging of breast: Secondary | ICD-10-CM

## 2017-12-10 DIAGNOSIS — C50411 Malignant neoplasm of upper-outer quadrant of right female breast: Secondary | ICD-10-CM

## 2017-12-15 ENCOUNTER — Ambulatory Visit
Admission: RE | Admit: 2017-12-15 | Discharge: 2017-12-15 | Disposition: A | Discharge status: Home / Self Care | Payer: 59 | Source: Ambulatory Visit | Attending: Oncology | Admitting: Oncology

## 2017-12-15 DIAGNOSIS — C50411 Malignant neoplasm of upper-outer quadrant of right female breast: Secondary | ICD-10-CM | POA: Insufficient documentation

## 2017-12-15 DIAGNOSIS — Z170 Estrogen receptor positive status [ER+]: Secondary | ICD-10-CM | POA: Insufficient documentation

## 2017-12-19 ENCOUNTER — Ambulatory Visit: Payer: Self-pay | Admitting: General Surgery

## 2017-12-19 NOTE — H&P (View-Only) (Signed)
PATIENT PROFILE: Anne Wu is a 37 y.o. female who presents to the Clinic for evaluation of breast cancer.  PCP:  Anne Rival, NP  HISTORY OF PRESENT ILLNESS: AnneCrossis a 37 y.o.femalepatient who comes for follow up of her breast cancer after completing neoadjuvant chemotherapy.   The patient refers that she palpateda lump in the upper outer quadrant of the right breast. This lead to a mammogram and breast ultrasound on 06/20/2017. This demonstrated a lobulated mass with associated pleomorphic calcifications and architectural distortion in the posterior upper outer right breast corresponding to the palpable abnormality. The measurement was 2.5 cm. There were no other breast masses. Ultrasound demonstrated a lobulated hypoechoic mass with partly indistinct margins as well as central echogenic foci consistent with the mammographic calcifications. The mass was found at 10 o'clock position 10 cm from the nipple corresponding to the palpable abnormality measuring 2.2 x 2.1 x 2.1 cm. Ultrasound demonstrated no enlarged or abnormal lymph nodes in the axilla. She had ultrasound-guided core needle biopsy with placement of a ribbon-shaped biopsy clip. Mammogram demonstrated this clip within the center of the mass. Pathology demonstrated invasive carcinoma grade 2. Immunohistochemistry studies ER PR positive HER-2/neu equivocal.  Upon evaluation by surgeon and Oncologist at that moment it was decided to give chemotherapy upfront to increase the chances of a negative margin partial mastectomy. Patient completed neoadjuvatn ddAC with Onpro. Patient tolerated well and with stable counts. Patient had another diagnostic mammogram and ultrasound on 09/08/17 and it was found to have a good response to chemotherapy with a decrease in size of the mass from 2.2 x 2.1 x 2.1 (before) to 1.3 x 1.0 x 1.3 (during therapy). Now patient had a post chemotherapy diagnostic ultrasound and was found with 1.2 x  0.7 x 1.4 (after chemotherapy). Patient had adequate partial response of the chemotherapy. Tolerated the chemotherapy well.    Patient comes today for discussion of surgery after completion of chemotherapy.    PROBLEM LIST:         Problem List  Never Reviewed         Noted   Malignant neoplasm of upper-outer quadrant of right breast in female, estrogen receptor positive (CMS-HCC) 11/06/2017      GENERAL REVIEW OF SYSTEMS:   General ROS: negative for - chills, fatigue, fever, weight gain or weight loss Allergy and Immunology ROS: negative for - hives  Hematological and Lymphatic ROS: negative for - bleeding problems or bruising, negative for palpable nodes Endocrine ROS: negative for - heat or cold intolerance, hair changes Respiratory ROS: negative for - cough, shortness of breath or wheezing Cardiovascular ROS: no chest pain or palpitations GI ROS: negative for nausea, vomiting, abdominal pain, diarrhea, constipation Musculoskeletal ROS: negative for - joint swelling or muscle pain Neurological ROS: negative for - confusion, syncope Dermatological ROS: negative for pruritus and rash Psychiatric: negative for anxiety, depression, difficulty sleeping and memory loss  MEDICATIONS: CurrentMedications        Current Outpatient Medications  Medication Sig Dispense Refill  . multivitamin tablet Take 1 tablet by mouth once daily    . ferrous sulfate 325 (65 FE) MG tablet Take 325 mg by mouth daily with breakfast     No current facility-administered medications for this visit.       ALLERGIES: Codeine  PAST MEDICAL HISTORY: No past medical history on file.  PAST SURGICAL HISTORY: No past surgical history on file.   FAMILY HISTORY:      Family History  Problem Relation Age  of Onset  . Diabetes Father   . High blood pressure (Hypertension) Brother      SOCIAL HISTORY: Social History          Socioeconomic History  . Marital status:  Married    Spouse name: Not on file  . Number of children: Not on file  . Years of education: Not on file  . Highest education level: Not on file  Occupational History  . Not on file  Social Needs  . Financial resource strain: Not on file  . Food insecurity:    Worry: Not on file    Inability: Not on file  . Transportation needs:    Medical: Not on file    Non-medical: Not on file  Tobacco Use  . Smoking status: Never Smoker  . Smokeless tobacco: Never Used  Substance and Sexual Activity  . Alcohol use: Not Currently  . Drug use: Not on file  . Sexual activity: Not on file  Other Topics Concern  . Not on file  Social History Narrative  . Not on file      PHYSICAL EXAM: There were no vitals filed for this visit. Body mass index is 32.61 kg/m. Weight: 86.2 kg (190 lb)   GENERAL: Alert, active, oriented x3  HEENT: Pupils equal reactive to light. Extraocular movements are intact. Sclera clear. Palpebral conjunctiva normal red color.Pharynx clear.  NECK: Supple with no palpable mass and no adenopathy.  LUNGS: Sound clear with no rales rhonchi or wheezes.  HEART: Regular rhythm S1 and S2 without murmur.  BREAST: breasts appear normal, no suspicious masses, no skin or nipple changes or axillary nodes.  ABDOMEN: Soft and depressible, nontender with no palpable mass, no hepatomegaly.  EXTREMITIES: Well-developed well-nourished symmetrical with no dependent edema.  NEUROLOGICAL: Awake alert oriented, facial expression symmetrical, moving all extremities.  REVIEW OF DATA: I have reviewed the following data today: No visits with results within 3 Month(s) from this visit.  Latest known visit with results is:  No results found for any previous visit.     DIAGNOSIS: A.RIGHT BREAST, UPPER OUTER QUADRANT, 10:00, 10 CM FN; ULTRASOUND-GUIDED BIOPSY: - INVASIVE MAMMARY CARCINOMA WITH MICROCALCIFICATIONS AND NECROSIS.  Size of invasive  carcinoma:1.2 cm in this sample Histologic grade of invasive carcinoma: Grade 2  Glandular/tubular differentiation score: 3  Nuclear pleomorphism score: 2  Mitotic rate score: 1  ER/PR: positive. HER2: negative  ASSESSMENT: Anne Wu is a 37 y.o. female presenting for consultation for breast cancer.    Patient was oriented again about the pathology results and the adequate response to neoadjuvant chemotherapy. Oncology notes were reviewed. Surgical alternatives were discussed with patient including partial vs total mastectomy. The goal of the chemotherapy was to reduce the mass for the patient to be able to have partial mastectomy and better chances of having negative margins. Surgical technique and post operative care was discussed with patient. Risk of surgery was discussed with patient including but not limited to: wound infection, seroma, hematoma, brachial plexopathy, mondor's disease (thrombosis of small veins of breast), chronic wound pain, breast lymphedema, altered sensation to the nipple and cosmesis among others. This encounter was more than 40 minutes most of it counseling the patient about surgery and post operative care.   PLAN: 1. Right breast needle guided partial mastectomy with right axillary sentinel lymph node biopsy ([id number], [id number]) 2. CBC, CMP 3. Avoid taking aspirin 5 days before surgery 4. Contact us if has any question or concern   Patient and her husband  verbalized understanding, all questions were answered, and were agreeable with the plan outlined above.   Dmani Mizer Cintron-Diaz, MD  Electronically signed by Saquan Furtick Cintron-Diaz, MD  

## 2017-12-19 NOTE — H&P (Signed)
PATIENT PROFILE: Anne Wu is a 37 y.o. female who presents to the Clinic for evaluation of breast cancer.  PCP:  Anne Rival, NP  HISTORY OF PRESENT ILLNESS: Anne Wu a 37 y.o.femalepatient who comes for follow up of her breast cancer after completing neoadjuvant chemotherapy.   The patient refers that she palpateda lump in the upper outer quadrant of the right breast. This lead to a mammogram and breast ultrasound on 06/20/2017. This demonstrated a lobulated mass with associated pleomorphic calcifications and architectural distortion in the posterior upper outer right breast corresponding to the palpable abnormality. The measurement was 2.5 cm. There were no other breast masses. Ultrasound demonstrated a lobulated hypoechoic mass with partly indistinct margins as well as central echogenic foci consistent with the mammographic calcifications. The mass was found at 10 o'clock position 10 cm from the nipple corresponding to the palpable abnormality measuring 2.2 x 2.1 x 2.1 cm. Ultrasound demonstrated no enlarged or abnormal lymph nodes in the axilla. She had ultrasound-guided core needle biopsy with placement of a ribbon-shaped biopsy clip. Mammogram demonstrated this clip within the center of the mass. Pathology demonstrated invasive carcinoma grade 2. Immunohistochemistry studies ER PR positive HER-2/neu equivocal.  Upon evaluation by surgeon and Oncologist at that moment it was decided to give chemotherapy upfront to increase the chances of a negative margin partial mastectomy. Patient completed neoadjuvatn ddAC with Onpro. Patient tolerated well and with stable counts. Patient had another diagnostic mammogram and ultrasound on 09/08/17 and it was found to have a good response to chemotherapy with a decrease in size of the mass from 2.2 x 2.1 x 2.1 (before) to 1.3 x 1.0 x 1.3 (during therapy). Now patient had a post chemotherapy diagnostic ultrasound and was found with 1.2 x  0.7 x 1.4 (after chemotherapy). Patient had adequate partial response of the chemotherapy. Tolerated the chemotherapy well.    Patient comes today for discussion of surgery after completion of chemotherapy.    PROBLEM LIST:         Problem List  Never Reviewed         Noted   Malignant neoplasm of upper-outer quadrant of right breast in female, estrogen receptor positive (CMS-HCC) 11/06/2017      GENERAL REVIEW OF SYSTEMS:   General ROS: negative for - chills, fatigue, fever, weight gain or weight loss Allergy and Immunology ROS: negative for - hives  Hematological and Lymphatic ROS: negative for - bleeding problems or bruising, negative for palpable nodes Endocrine ROS: negative for - heat or cold intolerance, hair changes Respiratory ROS: negative for - cough, shortness of breath or wheezing Cardiovascular ROS: no chest pain or palpitations GI ROS: negative for nausea, vomiting, abdominal pain, diarrhea, constipation Musculoskeletal ROS: negative for - joint swelling or muscle pain Neurological ROS: negative for - confusion, syncope Dermatological ROS: negative for pruritus and rash Psychiatric: negative for anxiety, depression, difficulty sleeping and memory loss  MEDICATIONS: CurrentMedications        Current Outpatient Medications  Medication Sig Dispense Refill  . multivitamin tablet Take 1 tablet by mouth once daily    . ferrous sulfate 325 (65 FE) MG tablet Take 325 mg by mouth daily with breakfast     No current facility-administered medications for this visit.       ALLERGIES: Codeine  PAST MEDICAL HISTORY: No past medical history on file.  PAST SURGICAL HISTORY: No past surgical history on file.   FAMILY HISTORY:      Family History  Problem Relation Age  of Onset  . Diabetes Father   . High blood pressure (Hypertension) Brother      SOCIAL HISTORY: Social History          Socioeconomic History  . Marital status:  Married    Spouse name: Not on file  . Number of children: Not on file  . Years of education: Not on file  . Highest education level: Not on file  Occupational History  . Not on file  Social Needs  . Financial resource strain: Not on file  . Food insecurity:    Worry: Not on file    Inability: Not on file  . Transportation needs:    Medical: Not on file    Non-medical: Not on file  Tobacco Use  . Smoking status: Never Smoker  . Smokeless tobacco: Never Used  Substance and Sexual Activity  . Alcohol use: Not Currently  . Drug use: Not on file  . Sexual activity: Not on file  Other Topics Concern  . Not on file  Social History Narrative  . Not on file      PHYSICAL EXAM: There were no vitals filed for this visit. Body mass index is 32.61 kg/m. Weight: 86.2 kg (190 lb)   GENERAL: Alert, active, oriented x3  HEENT: Pupils equal reactive to light. Extraocular movements are intact. Sclera clear. Palpebral conjunctiva normal red color.Pharynx clear.  NECK: Supple with no palpable mass and no adenopathy.  LUNGS: Sound clear with no rales rhonchi or wheezes.  HEART: Regular rhythm S1 and S2 without murmur.  BREAST: breasts appear normal, no suspicious masses, no skin or nipple changes or axillary nodes.  ABDOMEN: Soft and depressible, nontender with no palpable mass, no hepatomegaly.  EXTREMITIES: Well-developed well-nourished symmetrical with no dependent edema.  NEUROLOGICAL: Awake alert oriented, facial expression symmetrical, moving all extremities.  REVIEW OF DATA: I have reviewed the following data today: No visits with results within 3 Month(s) from this visit.  Latest known visit with results is:  No results found for any previous visit.     DIAGNOSIS: A.RIGHT BREAST, UPPER OUTER QUADRANT, 10:00, 10 CM FN; ULTRASOUND-GUIDED BIOPSY: - INVASIVE MAMMARY CARCINOMA WITH MICROCALCIFICATIONS AND NECROSIS.  Size of invasive  carcinoma:1.2 cm in this sample Histologic grade of invasive carcinoma: Grade 2  Glandular/tubular differentiation score: 3  Nuclear pleomorphism score: 2  Mitotic rate score: 1  ER/PR: positive. HER2: negative  ASSESSMENT: Anne Wu is a 37 y.o. female presenting for consultation for breast cancer.    Patient was oriented again about the pathology results and the adequate response to neoadjuvant chemotherapy. Oncology notes were reviewed. Surgical alternatives were discussed with patient including partial vs total mastectomy. The goal of the chemotherapy was to reduce the mass for the patient to be able to have partial mastectomy and better chances of having negative margins. Surgical technique and post operative care was discussed with patient. Risk of surgery was discussed with patient including but not limited to: wound infection, seroma, hematoma, brachial plexopathy, mondor's disease (thrombosis of small veins of breast), chronic wound pain, breast lymphedema, altered sensation to the nipple and cosmesis among others. This encounter was more than 40 minutes most of it counseling the patient about surgery and post operative care.   PLAN: 1. Right breast needle guided partial mastectomy with right axillary sentinel lymph node biopsy (19301, [id number]) 2. CBC, CMP 3. Avoid taking aspirin 5 days before surgery 4. Contact us if has any question or concern   Patient and her husband  verbalized understanding, all questions were answered, and were agreeable with the plan outlined above.   Alhassan Everingham Cintron-Diaz, MD  Electronically signed by Ercel Normoyle Cintron-Diaz, MD  

## 2017-12-22 ENCOUNTER — Encounter
Admission: RE | Admit: 2017-12-22 | Discharge: 2017-12-22 | Disposition: A | Discharge status: Home / Self Care | Payer: 59 | Source: Ambulatory Visit | Attending: General Surgery | Admitting: General Surgery

## 2017-12-22 ENCOUNTER — Other Ambulatory Visit: Payer: Self-pay

## 2017-12-22 ENCOUNTER — Inpatient Hospital Stay: Admission: RE | Admit: 2017-12-22 | Payer: 59 | Source: Ambulatory Visit

## 2017-12-22 NOTE — Patient Instructions (Signed)
Your procedure is scheduled on: Mon 6/17 Report to Mammography.7:45  Remember: Instructions that are not followed completely may result in serious medical risk, up to and including death, or upon the discretion of your surgeon and anesthesiologist your surgery may need to be rescheduled.     _X__ 1. Do not eat food after midnight the night before your procedure.                 No gum chewing or hard candies. You may drink clear liquids up to 2 hours                 before you are scheduled to arrive for your surgery- DO not drink clear                 liquids within 2 hours of the start of your surgery.                 Clear Liquids include:  water, apple juice without pulp, clear carbohydrate                 drink such as Clearfast of Gartorade, Black Coffee or Tea (Do not add                 anything to coffee or tea).  __X__2.  On the morning of surgery brush your teeth with toothpaste and water, you may rinse your mouth with mouthwash if you wish.  Do not swallow any              toothpaste of mouthwash.     ___ 3.  No Alcohol for 24 hours before or after surgery.   ___ 4.  Do Not Smoke or use e-cigarettes For 24 Hours Prior to Your Surgery.                 Do not use any chewable tobacco products for at least 6 hours prior to                 surgery.  ____  5.  Bring all medications with you on the day of surgery if instructed.   __x__  6.  Notify your doctor if there is any change in your medical condition      (cold, fever, infections).     Do not wear jewelry, make-up, hairpins, clips or nail polish. Do not wear lotions, powders, or perfumes. You may wear deodorant. Do not shave 48 hours prior to surgery. Men may shave face and neck. Do not bring valuables to the hospital.    [facility] is not responsible for any belongings or valuables.  Contacts, dentures or bridgework may not be worn into surgery. Leave your suitcase in the car. After surgery it  may be brought to your room. For patients admitted to the hospital, discharge time is determined by your treatment team.   Patients discharged the day of surgery will not be allowed to drive home.   Please read over the following fact sheets that you were given:    _x___ Take these medicines the morning of surgery with A SIP OF WATER:    1. loratadine (CLARITIN) 10 MG tablet  2. acetaminophen (TYLENOL) 325 MG tablet if needed  3.   4.  5.  6.  ____ Fleet Enema (as directed)   ___x_ Use CHG Soap as directed or shower the night before and morning of surgery  ____ Use inhalers on the day of surgery  ____  Stop metformin 2 days prior to surgery    ____ Take 1/2 of usual insulin dose the night before surgery. No insulin the morning          of surgery.   ____ Stop Coumadin/Plavix/aspirin on  ____ Stop Anti-inflammatories on    ____ Stop supplements until after surgery.    ____ Bring C-Pap to the hospital.

## 2017-12-26 ENCOUNTER — Encounter: Payer: Self-pay | Admitting: Radiology

## 2017-12-26 DIAGNOSIS — Z9221 Personal history of antineoplastic chemotherapy: Secondary | ICD-10-CM | POA: Insufficient documentation

## 2017-12-28 MED ORDER — CEFAZOLIN SODIUM-DEXTROSE 2-4 GM/100ML-% IV SOLN
2.0000 g | INTRAVENOUS | Status: AC
  Administered 2017-12-29: 2 g via INTRAVENOUS

## 2017-12-29 ENCOUNTER — Ambulatory Visit
Admission: RE | Admit: 2017-12-29 | Discharge: 2017-12-29 | Disposition: A | Discharge status: Home / Self Care | Payer: 59 | Source: Ambulatory Visit | Attending: General Surgery | Admitting: General Surgery

## 2017-12-29 ENCOUNTER — Encounter
Admission: RE | Disposition: A | Discharge status: Home / Self Care | Payer: Self-pay | Source: Ambulatory Visit | Attending: General Surgery

## 2017-12-29 ENCOUNTER — Ambulatory Visit: Payer: 59 | Admitting: Anesthesiology

## 2017-12-29 ENCOUNTER — Encounter
Admission: RE | Admit: 2017-12-29 | Discharge: 2017-12-29 | Disposition: A | Discharge status: Home / Self Care | Payer: 59 | Source: Ambulatory Visit | Attending: General Surgery | Admitting: General Surgery

## 2017-12-29 ENCOUNTER — Other Ambulatory Visit: Payer: Self-pay | Admitting: General Surgery

## 2017-12-29 ENCOUNTER — Other Ambulatory Visit: Payer: Self-pay

## 2017-12-29 ENCOUNTER — Encounter: Payer: Self-pay | Admitting: *Deleted

## 2017-12-29 ENCOUNTER — Ambulatory Visit
Admission: RE | Admit: 2017-12-29 | Discharge: 2017-12-29 | Disposition: A | Discharge status: Home / Self Care | Payer: 59 | Source: Ambulatory Visit | Attending: Oncology | Admitting: Oncology

## 2017-12-29 DIAGNOSIS — C50411 Malignant neoplasm of upper-outer quadrant of right female breast: Secondary | ICD-10-CM

## 2017-12-29 DIAGNOSIS — Z170 Estrogen receptor positive status [ER+]: Secondary | ICD-10-CM | POA: Insufficient documentation

## 2017-12-29 DIAGNOSIS — D0511 Intraductal carcinoma in situ of right breast: Secondary | ICD-10-CM | POA: Diagnosis not present

## 2017-12-29 DIAGNOSIS — Z9221 Personal history of antineoplastic chemotherapy: Secondary | ICD-10-CM | POA: Diagnosis not present

## 2017-12-29 DIAGNOSIS — I10 Essential (primary) hypertension: Secondary | ICD-10-CM | POA: Insufficient documentation

## 2017-12-29 DIAGNOSIS — Z8249 Family history of ischemic heart disease and other diseases of the circulatory system: Secondary | ICD-10-CM | POA: Diagnosis not present

## 2017-12-29 DIAGNOSIS — R928 Other abnormal and inconclusive findings on diagnostic imaging of breast: Secondary | ICD-10-CM

## 2017-12-29 DIAGNOSIS — J45909 Unspecified asthma, uncomplicated: Secondary | ICD-10-CM | POA: Diagnosis not present

## 2017-12-29 DIAGNOSIS — N6310 Unspecified lump in the right breast, unspecified quadrant: Secondary | ICD-10-CM | POA: Diagnosis present

## 2017-12-29 HISTORY — PX: BREAST LUMPECTOMY: SHX2

## 2017-12-29 HISTORY — PX: SENTINEL NODE BIOPSY: SHX6608

## 2017-12-29 HISTORY — PX: PARTIAL MASTECTOMY WITH NEEDLE LOCALIZATION: SHX6008

## 2017-12-29 HISTORY — PX: BREAST EXCISIONAL BIOPSY: SUR124

## 2017-12-29 LAB — POCT PREGNANCY, URINE: Preg Test, Ur: NEGATIVE

## 2017-12-29 SURGERY — PARTIAL MASTECTOMY WITH NEEDLE LOCALIZATION
Anesthesia: General | Laterality: Right | Wound class: "Clean "

## 2017-12-29 MED ORDER — TECHNETIUM TC 99M SULFUR COLLOID FILTERED
0.7490 | Freq: Once | INTRAVENOUS | Status: AC | PRN
  Administered 2017-12-29: 0.749 via INTRADERMAL

## 2017-12-29 MED ORDER — LACTATED RINGERS IV SOLN
INTRAVENOUS | Status: DC
  Administered 2017-12-29 (×2): via INTRAVENOUS

## 2017-12-29 MED ORDER — ONDANSETRON HCL 4 MG/2ML IJ SOLN
INTRAMUSCULAR | Status: AC
Start: 2017-12-29 — End: ?
  Filled 2017-12-29: qty 2

## 2017-12-29 MED ORDER — PROPOFOL 10 MG/ML IV BOLUS
INTRAVENOUS | Status: DC | PRN
  Administered 2017-12-29: 200 mg via INTRAVENOUS

## 2017-12-29 MED ORDER — ACETAMINOPHEN 10 MG/ML IV SOLN
INTRAVENOUS | Status: AC
  Filled 2017-12-29: qty 100

## 2017-12-29 MED ORDER — MIDAZOLAM HCL 2 MG/2ML IJ SOLN
INTRAMUSCULAR | Status: AC
  Filled 2017-12-29: qty 2

## 2017-12-29 MED ORDER — FAMOTIDINE 20 MG PO TABS: 20.0000 mg | ORAL_TABLET | Freq: Once | ORAL | Status: DC

## 2017-12-29 MED ORDER — FENTANYL CITRATE (PF) 100 MCG/2ML IJ SOLN
INTRAMUSCULAR | Status: DC | PRN
  Administered 2017-12-29: 100 ug via INTRAVENOUS
  Administered 2017-12-29 (×2): 50 ug via INTRAVENOUS

## 2017-12-29 MED ORDER — SEVOFLURANE IN SOLN
RESPIRATORY_TRACT | Status: AC
  Filled 2017-12-29: qty 250

## 2017-12-29 MED ORDER — SUCCINYLCHOLINE CHLORIDE 20 MG/ML IJ SOLN
INTRAMUSCULAR | Status: DC | PRN
  Administered 2017-12-29: 100 mg via INTRAVENOUS

## 2017-12-29 MED ORDER — BUPIVACAINE-EPINEPHRINE 0.5% -1:200000 IJ SOLN
INTRAMUSCULAR | Status: DC | PRN
  Administered 2017-12-29: 20 mL

## 2017-12-29 MED ORDER — FENTANYL CITRATE (PF) 100 MCG/2ML IJ SOLN: 25.0000 ug | INTRAMUSCULAR | Status: DC | PRN

## 2017-12-29 MED ORDER — PROPOFOL 10 MG/ML IV BOLUS
INTRAVENOUS | Status: AC
  Filled 2017-12-29: qty 20

## 2017-12-29 MED ORDER — BUPIVACAINE-EPINEPHRINE (PF) 0.5% -1:200000 IJ SOLN
INTRAMUSCULAR | Status: AC
  Filled 2017-12-29: qty 30

## 2017-12-29 MED ORDER — ACETAMINOPHEN 10 MG/ML IV SOLN
INTRAVENOUS | Status: DC | PRN
  Administered 2017-12-29: 1000 mg via INTRAVENOUS

## 2017-12-29 MED ORDER — FAMOTIDINE 20 MG PO TABS
ORAL_TABLET | ORAL | Status: AC
  Administered 2017-12-29: 20 mg
  Filled 2017-12-29: qty 1

## 2017-12-29 MED ORDER — DEXAMETHASONE SODIUM PHOSPHATE 10 MG/ML IJ SOLN
INTRAMUSCULAR | Status: DC | PRN
  Administered 2017-12-29: 10 mg via INTRAVENOUS

## 2017-12-29 MED ORDER — DEXAMETHASONE SODIUM PHOSPHATE 10 MG/ML IJ SOLN
INTRAMUSCULAR | Status: AC
  Filled 2017-12-29: qty 1

## 2017-12-29 MED ORDER — MIDAZOLAM HCL 2 MG/2ML IJ SOLN
INTRAMUSCULAR | Status: DC | PRN
  Administered 2017-12-29: 2 mg via INTRAVENOUS

## 2017-12-29 MED ORDER — ONDANSETRON HCL 4 MG/2ML IJ SOLN
INTRAMUSCULAR | Status: DC | PRN
  Administered 2017-12-29: 4 mg via INTRAVENOUS

## 2017-12-29 MED ORDER — FENTANYL CITRATE (PF) 100 MCG/2ML IJ SOLN
INTRAMUSCULAR | Status: AC
  Filled 2017-12-29: qty 2

## 2017-12-29 MED ORDER — ONDANSETRON HCL 4 MG/2ML IJ SOLN: 4.0000 mg | Freq: Once | INTRAMUSCULAR | Status: DC | PRN

## 2017-12-29 SURGICAL SUPPLY — 41 items
CANISTER SUCT 1200ML W/VALVE (MISCELLANEOUS) ×3 IMPLANT
CHLORAPREP W/TINT 26ML (MISCELLANEOUS) ×3 IMPLANT
CNTNR SPEC 2.5X3XGRAD LEK (MISCELLANEOUS) ×2
CONT SPEC 4OZ STER OR WHT (MISCELLANEOUS) ×4
CONTAINER SPEC 2.5X3XGRAD LEK (MISCELLANEOUS) ×2 IMPLANT
DERMABOND ADVANCED (GAUZE/BANDAGES/DRESSINGS) ×2
DERMABOND ADVANCED .7 DNX12 (GAUZE/BANDAGES/DRESSINGS) ×1 IMPLANT
DEVICE DUBIN SPECIMEN MAMMOGRA (MISCELLANEOUS) ×3 IMPLANT
DRAPE LAPAROTOMY 77X122 PED (DRAPES) ×3 IMPLANT
DRAPE LAPAROTOMY TRNSV 106X77 (MISCELLANEOUS) ×3 IMPLANT
ELECT REM PT RETURN 9FT ADLT (ELECTROSURGICAL) ×3
ELECTRODE REM PT RTRN 9FT ADLT (ELECTROSURGICAL) ×1 IMPLANT
GLOVE BIO SURGEON STRL SZ 6.5 (GLOVE) ×2 IMPLANT
GLOVE BIO SURGEONS STRL SZ 6.5 (GLOVE) ×1
GLOVE BIOGEL M 6.5 STRL (GLOVE) ×3 IMPLANT
GOWN STRL REUS W/ TWL LRG LVL3 (GOWN DISPOSABLE) ×2 IMPLANT
GOWN STRL REUS W/TWL LRG LVL3 (GOWN DISPOSABLE) ×4
KIT TURNOVER KIT A (KITS) ×3 IMPLANT
LABEL OR SOLS (LABEL) ×3 IMPLANT
MARGIN MAP 10MM (MISCELLANEOUS) ×3 IMPLANT
NDL HYPO 25X1 1.5 SAFETY (NEEDLE) ×1 IMPLANT
NDL SAFETY ECLIPSE 18X1.5 (NEEDLE) ×1 IMPLANT
NEEDLE HYPO 18GX1.5 SHARP (NEEDLE) ×2
NEEDLE HYPO 22GX1.5 SAFETY (NEEDLE) ×3 IMPLANT
NEEDLE HYPO 25X1 1.5 SAFETY (NEEDLE) ×3 IMPLANT
PACK BASIN MINOR ARMC (MISCELLANEOUS) ×3 IMPLANT
RETRACTOR WOUND ALXS 18CM SML (MISCELLANEOUS) ×1 IMPLANT
RTRCTR WOUND ALEXIS O 18CM SML (MISCELLANEOUS) ×3
SLEVE PROBE SENORX GAMMA FIND (MISCELLANEOUS) ×3 IMPLANT
SUT ETHILON 3-0 FS-10 30 BLK (SUTURE) ×3
SUT MNCRL 4-0 (SUTURE) ×2
SUT MNCRL 4-0 27XMFL (SUTURE) ×1
SUT SILK 2 0 SH (SUTURE) IMPLANT
SUT VIC AB 2-0 SH 27 (SUTURE) ×2
SUT VIC AB 2-0 SH 27XBRD (SUTURE) IMPLANT
SUT VIC AB 3-0 SH 27 (SUTURE) ×4
SUT VIC AB 3-0 SH 27X BRD (SUTURE) ×1 IMPLANT
SUTURE EHLN 3-0 FS-10 30 BLK (SUTURE) ×1 IMPLANT
SUTURE MNCRL 4-0 27XMF (SUTURE) ×1 IMPLANT
SYR 10ML LL (SYRINGE) ×3 IMPLANT
WATER STERILE IRR 1000ML POUR (IV SOLUTION) ×3 IMPLANT

## 2017-12-29 NOTE — Anesthesia Post-op Follow-up Note (Signed)
Anesthesia QCDR form completed.        

## 2017-12-29 NOTE — Anesthesia Preprocedure Evaluation (Signed)
Anesthesia Evaluation  Patient identified by MRN, date of birth, ID band Patient awake    Reviewed: Allergy & Precautions, NPO status , Patient's Chart, lab work & pertinent test results, reviewed documented beta blocker date and time   Airway Mallampati: III  TM Distance: >3 FB     Dental  (+) Chipped   Pulmonary asthma ,    Pulmonary exam normal        Cardiovascular hypertension, Pt. on medications Normal cardiovascular exam     Neuro/Psych negative neurological ROS  negative psych ROS   GI/Hepatic Neg liver ROS, GERD  Controlled,  Endo/Other  negative endocrine ROS  Renal/GU negative Renal ROS     Musculoskeletal negative musculoskeletal ROS (+)   Abdominal Normal abdominal exam  (+)   Peds negative pediatric ROS (+)  Hematology negative hematology ROS (+)   Anesthesia Other Findings One broken tooth.  Reproductive/Obstetrics                             Anesthesia Physical  Anesthesia Plan  ASA: II  Anesthesia Plan: General   Post-op Pain Management:    Induction:   PONV Risk Score and Plan:   Airway Management Planned: Oral ETT  Additional Equipment:   Intra-op Plan:   Post-operative Plan: Extubation in OR  Informed Consent: I have reviewed the patients History and Physical, chart, labs and discussed the procedure including the risks, benefits and alternatives for the proposed anesthesia with the patient or authorized representative who has indicated his/her understanding and acceptance.     Plan Discussed with: CRNA and Surgeon  Anesthesia Plan Comments:         Anesthesia Quick Evaluation

## 2017-12-29 NOTE — Discharge Instructions (Signed)
Diet: Resume home heart healthy regular diet.   Activity: No heavy lifting >20 pounds (children, pets, laundry, garbage) or strenuous activity until follow-up, but light activity and walking are encouraged. Do not drive or drink alcohol if taking narcotic pain medications.  Wound care: May shower with soapy water and pat dry (do not rub incisions), but no baths or submerging incision underwater until follow-up. (no swimming)   Medications: Resume all home medications. For mild to moderate pain: acetaminophen (Tylenol) or ibuprofen (if no kidney disease). Combining Tylenol with alcohol can substantially increase your risk of causing liver disease. Narcotic pain medications, if prescribed, can be used for severe pain, though may cause nausea, constipation, and drowsiness. Do not combine Tylenol and Norco within a 6 hour period as Norco contains Tylenol. If you do not need the narcotic pain medication, you do not need to fill the prescription.  Call office [phone]) at any time if any questions, worsening pain, fevers/chills, bleeding, drainage from incision site, or other concerns.  General Anesthesia, Adult, Care After These instructions provide you with information about caring for yourself after your procedure. Your health care provider may also give you more specific instructions. Your treatment has been planned according to current medical practices, but problems sometimes occur. Call your health care provider if you have any problems or questions after your procedure. What can I expect after the procedure? After the procedure, it is common to have:  Vomiting.  A sore throat.  Mental slowness.  It is common to feel:  Nauseous.  Cold or shivery.  Sleepy.  Tired.  Sore or achy, even in parts of your body where you did not have surgery.  Follow these instructions at home: For at least 24 hours after the procedure:  Do not: ? Participate in activities where you could fall  or become injured. ? Drive. ? Use heavy machinery. ? Drink alcohol. ? Take sleeping pills or medicines that cause drowsiness. ? Make important decisions or sign legal documents. ? Take care of children on your own.  Rest. Eating and drinking  If you vomit, drink water, juice, or soup when you can drink without vomiting.  Drink enough fluid to keep your urine clear or pale yellow.  Make sure you have little or no nausea before eating solid foods.  Follow the diet recommended by your health care provider. General instructions  Have a responsible adult stay with you until you are awake and alert.  Return to your normal activities as told by your health care provider. Ask your health care provider what activities are safe for you.  Take over-the-counter and prescription medicines only as told by your health care provider.  If you smoke, do not smoke without supervision.  Keep all follow-up visits as told by your health care provider. This is important. Contact a health care provider if:  You continue to have nausea or vomiting at home, and medicines are not helpful.  You cannot drink fluids or start eating again.  You cannot urinate after 8-12 hours.  You develop a skin rash.  You have fever.  You have increasing redness at the site of your procedure. Get help right away if:  You have difficulty breathing.  You have chest pain.  You have unexpected bleeding.  You feel that you are having a life-threatening or urgent problem. This information is not intended to replace advice given to you by your health care provider. Make sure you discuss any questions you have with your health  care provider. Document Released: 10/07/2000 Document Revised: 12/04/2015 Document Reviewed: 06/15/2015 Elsevier Interactive Patient Education  [employer].

## 2017-12-29 NOTE — Anesthesia Procedure Notes (Signed)
Procedure Name: Intubation Date/Time: 12/29/2017 1:33 PM Performed by: Timoteo Expose, CRNA Pre-anesthesia Checklist: Patient identified, Emergency Drugs available, Suction available, Patient being monitored and Timeout performed Patient Re-evaluated:Patient Re-evaluated prior to induction Preoxygenation: Pre-oxygenation with 100% oxygen Induction Type: IV induction Ventilation: Mask ventilation without difficulty Laryngoscope Size: McGraph and 3 Grade View: Grade II Tube type: Oral Tube size: 6.5 mm Number of attempts: 1 Airway Equipment and Method: Stylet Placement Confirmation: ETT inserted through vocal cords under direct vision,  positive ETCO2,  CO2 detector and breath sounds checked- equal and bilateral Secured at: 20 cm Tube secured with: Tape Dental Injury: Teeth and Oropharynx as per pre-operative assessment  Future Recommendations: Recommend- induction with short-acting agent, and alternative techniques readily available

## 2017-12-29 NOTE — Transfer of Care (Signed)
Immediate Anesthesia Transfer of Care Note  Patient: Anne Wu  Procedure(s) Performed: PARTIAL MASTECTOMY WITH NEEDLE LOCALIZATION (Right ) SENTINEL NODE BIOPSY (Right )  Patient Location: PACU  Anesthesia Type:General  Level of Consciousness: sedated  Airway & Oxygen Therapy: Patient Spontanous Breathing and Patient connected to face mask oxygen  Post-op Assessment: Report Vss  Post vital signs: Reviewed and stable  Last Vitals:  Vitals Value Taken Time  BP 149/79 12/29/2017  3:36 PM  Temp    Pulse 83 12/29/2017  3:38 PM  Resp 15 12/29/2017  3:38 PM  SpO2 100 % 12/29/2017  3:38 PM  Vitals shown include unvalidated device data.  Last Pain:  Vitals:   12/29/17 0935  TempSrc: Oral         Complications: No apparent anesthesia complications

## 2017-12-29 NOTE — Interval H&P Note (Signed)
History and Physical Interval Note:  12/29/2017 12:47 PM  Anne Wu  has presented today for surgery, with the diagnosis of malignant neoplasm right breast  The various methods of treatment have been discussed with the patient and family. After consideration of risks, benefits and other options for treatment, the patient has consented to  Procedure(s): PARTIAL MASTECTOMY WITH NEEDLE LOCALIZATION (Right) SENTINEL NODE BIOPSY (Right) as a surgical intervention .  The patient's history has been reviewed, patient examined, no change in status, stable for surgery.  I have reviewed the patient's chart and labs.  Right breast marked in the pre procedure room. Questions were answered to the patient's satisfaction.     [name]

## 2017-12-29 NOTE — Op Note (Signed)
Preoperative diagnosis: Right breast carcinoma.  Postoperative diagnosis: Right breast carcinoma.   Procedure: Right needle-localized breast lumpectomy.                       Right Axillary Sentinel Lymph node biopsy  Anesthesia: GETA  Surgeon: Dr. Windell Moment  Wound Classification: Clean  Indications: Patient is a 37 y.o. female with a nonpalpable right breast mass noted on mammography with core biopsy demonstrating invasive mammary carcinoma. Patient received neoadjuvant chemotherapy and mass reduced in size to 1.4 cm. Patient requires needle-localized lumpectomy for treatment with sentinel lymph node biopsy for staging.   Findings: 1. Specimen mammography shows marker and wire on specimen 2. Pathology call refers gross examination of margins was negative being the superior margin the closes margin with 4 mm distance. 3. Two sentinel lymph nodes identified. 4. No other palpable mass or lymph node identified.   Description of procedure: Preoperative needle localization was performed by radiology. In the nuclear medicine suite, the subareolar region was injected with Tc-99 sulfur colloid. Localization studies were reviewed. The patient was taken to the operating room and placed supine on the operating table, and after general anesthesia the right chest and axilla were prepped and draped in the usual sterile fashion. A time-out was completed verifying correct patient, procedure, site, positioning, and implant(s) and/or special equipment prior to beginning this procedure.  By comparing the localization studies with the direction and skin entry site of the needle, the probable trajectory and location of the mass was visualized. A circumareolar skin incision was planned in such a way as to minimize the amount of dissection to reach the mass.  The skin incision was made. Flaps were raised and the location of the wire confirmed. The wire was delivered into the wound. A 2-0 silk figure-of-eight stay  suture was placed around the wire and used for retraction. Dissection was then taken down circumferentially, taking care to include the entire localizing needle and a wide margin of grossly normal tissue. The specimen and entire localizing wire were removed. The specimen was oriented and sent to radiology with the localization studies. Confirmation was received that the entire target lesion had been resected. The wound was irrigated. Hemostasis was checked.  Through the same incision since it was latera, a hand-held gamma probe was used to identify the location of the hottest spot in the axilla. The fascia was opened and the axillary fat was identified. The probe was placed and again, the point of maximal count was found. Dissection continue until nodule was identified. The node was excised in its entirety. Ex vivo, the node measured 445 counts when placed on the probe. An additional hot spot was detected and the node was excised in similar fashion. The counts registered was 386. No additional hot spots were identified. No clinically abnormal nodes were palpated. The procedure was terminated. The axillary fascia was closed with 2-0 Vicryl. The subdermal tissue of the breast incision was closed with 3-0 Vicryl and skin was closed with subcuticular suture of Monocryl 4-0.  The patient tolerated the procedure well and was taken to the postanesthesia care unit in stable condition.   Specimen: Right Breast mass (Orientation markers used: Cranial, Caudal, Medial, Lateral,Skin, Deep)                   Sentinel Lymph node #1                   Sentinel Lymph node #2  Complications: None  Estimated Blood Loss: 10 mL

## 2017-12-30 ENCOUNTER — Encounter: Payer: Self-pay | Admitting: General Surgery

## 2017-12-30 NOTE — Anesthesia Postprocedure Evaluation (Signed)
Anesthesia Post Note  Patient: Anne Wu  Procedure(s) Performed: PARTIAL MASTECTOMY WITH NEEDLE LOCALIZATION (Right ) SENTINEL NODE BIOPSY (Right )  Patient location during evaluation: PACU Anesthesia Type: General Level of consciousness: awake and alert Pain management: pain level controlled Vital Signs Assessment: post-procedure vital signs reviewed and stable Respiratory status: spontaneous breathing, nonlabored ventilation, respiratory function stable and patient connected to nasal cannula oxygen Cardiovascular status: blood pressure returned to baseline and stable Postop Assessment: no apparent nausea or vomiting Anesthetic complications: no     Last Vitals:  Vitals:   12/29/17 1640 12/29/17 1654  BP: (!) 143/88 (!) 147/92  Pulse: (!) 112 (!) 110  Resp: 12 12  Temp:    SpO2: 98% 98%    Last Pain:  Vitals:   12/29/17 1614  TempSrc: Temporal  PainSc: 3                  Precious Haws Rodriques Badie

## 2017-12-31 ENCOUNTER — Telehealth: Payer: Self-pay | Admitting: *Deleted

## 2017-12-31 NOTE — Telephone Encounter (Signed)
port flush on 7/2 okay to schedule then (or on same day at appt with Dr Tasia Catchings)         Lab/MD week of 01/19/18 per Almyra Free 12/31/17 staff message    Patient has been scheduled for 01/23/18 Message was left on her vmail to make her aware of the scheduled date and time.  Also a letter will be mailed out as well.

## 2018-01-01 LAB — SURGICAL PATHOLOGY

## 2018-01-06 ENCOUNTER — Other Ambulatory Visit: Payer: Self-pay

## 2018-01-12 ENCOUNTER — Inpatient Hospital Stay: Payer: 59 | Attending: Oncology

## 2018-01-12 ENCOUNTER — Inpatient Hospital Stay (HOSPITAL_BASED_OUTPATIENT_CLINIC_OR_DEPARTMENT_OTHER): Payer: 59 | Admitting: Oncology

## 2018-01-12 ENCOUNTER — Encounter: Payer: Self-pay | Admitting: Oncology

## 2018-01-12 ENCOUNTER — Other Ambulatory Visit: Payer: Self-pay

## 2018-01-12 VITALS — BP 163/88 | HR 98 | Temp 97.6°F | Wt 194.5 lb

## 2018-01-12 DIAGNOSIS — Z170 Estrogen receptor positive status [ER+]: Secondary | ICD-10-CM | POA: Insufficient documentation

## 2018-01-12 DIAGNOSIS — C50411 Malignant neoplasm of upper-outer quadrant of right female breast: Secondary | ICD-10-CM

## 2018-01-12 DIAGNOSIS — C50511 Malignant neoplasm of lower-outer quadrant of right female breast: Secondary | ICD-10-CM | POA: Insufficient documentation

## 2018-01-12 DIAGNOSIS — R000 Tachycardia, unspecified: Secondary | ICD-10-CM

## 2018-01-12 LAB — CBC WITH DIFFERENTIAL/PLATELET
Basophils Absolute: 0 10*3/uL (ref 0–0.1)
Basophils Relative: 0 %
Eosinophils Absolute: 0.1 10*3/uL (ref 0–0.7)
Eosinophils Relative: 2 %
HCT: 37.2 % (ref 35.0–47.0)
Hemoglobin: 12.6 g/dL (ref 12.0–16.0)
LYMPHS ABS: 1.9 10*3/uL (ref 1.0–3.6)
LYMPHS PCT: 25 %
MCH: 29.5 pg (ref 26.0–34.0)
MCHC: 33.9 g/dL (ref 32.0–36.0)
MCV: 87 fL (ref 80.0–100.0)
MONO ABS: 0.6 10*3/uL (ref 0.2–0.9)
MONOS PCT: 8 %
Neutro Abs: 4.9 10*3/uL (ref 1.4–6.5)
Neutrophils Relative %: 65 %
Platelets: 338 10*3/uL (ref 150–440)
RBC: 4.27 MIL/uL (ref 3.80–5.20)
RDW: 12.8 % (ref 11.5–14.5)
WBC: 7.5 10*3/uL (ref 3.6–11.0)

## 2018-01-12 LAB — COMPREHENSIVE METABOLIC PANEL
ALT: 23 U/L (ref 0–44)
ANION GAP: 10 (ref 5–15)
AST: 25 U/L (ref 15–41)
Albumin: 3.9 g/dL (ref 3.5–5.0)
Alkaline Phosphatase: 59 U/L (ref 38–126)
BUN: 15 mg/dL (ref 6–20)
CALCIUM: 8.9 mg/dL (ref 8.9–10.3)
CO2: 23 mmol/L (ref 22–32)
Chloride: 102 mmol/L (ref 98–111)
Creatinine, Ser: 0.53 mg/dL (ref 0.44–1.00)
GFR calc Af Amer: >60 mL/min (ref >60)
Glucose, Bld: 94 mg/dL (ref 70–99)
POTASSIUM: 3.9 mmol/L (ref 3.5–5.1)
Sodium: 135 mmol/L (ref 135–145)
TOTAL PROTEIN: 7.1 g/dL (ref 6.5–8.1)
Total Bilirubin: 0.4 mg/dL (ref 0.3–1.2)

## 2018-01-12 MED ORDER — HEPARIN SOD (PORK) LOCK FLUSH 100 UNIT/ML IV SOLN
500.0000 [IU] | Freq: Once | INTRAVENOUS | Status: AC
  Administered 2018-01-12: 500 [IU] via INTRAVENOUS

## 2018-01-12 MED ORDER — SODIUM CHLORIDE 0.9% FLUSH
10.0000 mL | Freq: Once | INTRAVENOUS | Status: AC
  Administered 2018-01-12: 10 mL via INTRAVENOUS
  Filled 2018-01-12: qty 10

## 2018-01-12 NOTE — Progress Notes (Signed)
Patient here today for follow up post breast surgery.  Patient states no new concerns today.

## 2018-01-13 NOTE — Progress Notes (Signed)
Hematology/Oncology Follow up note [facility] [phone] [phone]   Patient Care Team: [name], NP as PCP - General (Nurse Practitioner)  REASON FOR VISIT Follow up for management of breast cancer   HISTORY OF PRESENTING ILLNESS:  Anne Wu is a  37 y.o.  female with diagnosis of cT2N0M0, grade 2 invasive mammary carcinoma.  She has significant family history on both maternal side as well as paternal side. She felt a right breast mass 2 months ago and diagnostic mammogram showed right upper quandrant 2.2 cm mass, which was confirmed on [place] and biopsied. Right axillary no clinically suspicious lymph node. Pathology showed invasive mammary carcinoma, ER/PR positive,, HER2 IHC  equivocal, FISH negative.   # Patient was evaluated by Dr.Smith. From surgical standpoint, Dr.Smith recommend neoadjuvant chemotherapy to facilitate surgery to obtain negative margin and better surgical outcome. Also given patient's young age, neoadjuvant chemotherapy is reasonable.    #Patient stated that she does not desire future fertility.  # 2D Echo showed normal systolic function, mild mitral regurgitation.   # Testing did not reveal any pathogenic mutation in any of these genes. A copy of the genetic test report will be scanned into Epic under the Media tab.The genes analyzed were the 23 genes on [facility]'s Breast/GYN panel (ATM, BARD1, BRCA1, BRCA2, BRIP1, CDH1, CHEK2, DICER1, EPCAM, MLH1,  MSH2, MSH6, NBN, NF1, PALB2, PMS2, PTEN, RAD50, RAD51C, RAD51D,SMARCA4, STK11, and TP53).  09/08/2017 Interval mammogram after 4 cycle of AC, showed good treatment response. The biopsy-proven malignancy in the upper outer quadrant of the right breast at posterior depth, associated with scattered microcalcifications and architectural distortion, has significantly decreased in size since the mammogram 06/20/2017. On today's mammogram, the mass measures approximately 1.7 x  2.2 x 1.2 cm (previously 2.4 x 1.6 x 2.7 cm).   INTERVAL HISTORY Anne Wu is a 37 y.o. female who has above history reviewed by me presents to discuss pathology results. S/p right lumpectomy and sentinel lymph node biopsy.   Reports recovering well. No issue with wound.  Pathology showed ypT2 ypN0 (i+) (sn) case was discussed on tumor board.   Review of Systems  Constitutional: Negative for chills, fever, malaise/fatigue and weight loss.  HENT: Negative for congestion, ear discharge, ear pain, hearing loss, nosebleeds, sinus pain and sore throat.   Eyes: Negative for double vision, photophobia, pain, discharge and redness.  Respiratory: Negative for cough, hemoptysis, sputum production, shortness of breath and wheezing.   Cardiovascular: Negative for chest pain, palpitations, orthopnea, claudication and leg swelling.  Gastrointestinal: Negative for abdominal pain, blood in stool, constipation, diarrhea, heartburn, melena, nausea and vomiting.  Genitourinary: Negative for dysuria, flank pain, frequency, hematuria and urgency.  Musculoskeletal: Negative for back pain, myalgias and neck pain.  Skin: Negative for itching and rash.  Neurological: Negative for dizziness, tingling, tremors, sensory change, focal weakness, weakness and headaches.  Endo/Heme/Allergies: Negative for environmental allergies and polydipsia. Does not bruise/bleed easily.  Psychiatric/Behavioral: Negative for depression, hallucinations, substance abuse and suicidal ideas. The patient is not nervous/anxious.     MEDICAL HISTORY:  Past Medical History:  Diagnosis Date  . Asthma    AS A CHILD-NO INHALERS  . Breast cancer ([place])   . Elevated blood pressure, situational    PT STATES HER LAST COUPLE OF MD APPOINTMENTS SHE HAS HAD ELEVATED BP-NEVER HAD A PROBLEM WITH THIS PREVIOUSLY  . Genetic testing 09/05/2017   Breast/GYN panel (23 genes) @ [facility] - No pathogenic mutations detected  . GERD (gastroesophageal  reflux  disease)    OCC- NO MEDS  . Personal history of chemotherapy     SURGICAL HISTORY: Past Surgical History:  Procedure Laterality Date  . BREAST BIOPSY Right 06/20/2017   Invasive Mammary Carcinoma  . BREAST EXCISIONAL BIOPSY Right 12/29/2017   lumpectomy with NL  . BREAST LUMPECTOMY Right 12/29/2017   needle localization and sentinel node injection  . NO PAST SURGERIES    . PARTIAL MASTECTOMY WITH NEEDLE LOCALIZATION Right 12/29/2017   Procedure: PARTIAL MASTECTOMY WITH NEEDLE LOCALIZATION;  Surgeon: [name], MD;  Location: [facility] ORS;  Service: General;  Laterality: Right;  . PORTACATH PLACEMENT N/A 07/11/2017   Procedure: INSERTION PORT-A-CATH;  Surgeon: Leonie Green, MD;  Location: [facility];  Service: General;  Laterality: N/A;  . SENTINEL NODE BIOPSY Right 12/29/2017   Procedure: SENTINEL NODE BIOPSY;  Surgeon: [name], MD;  Location: [facility] OR[facility];  Service: General;  Laterality: Right;    SOCIAL HISTORY: Social History   Socioeconomic History  . Marital status: Married    Spouse name: Not on file  . Number of children: Not on file  . Years of education: Not on file  . Highest education level: Not on file  Occupational History  . Not on file  Social Needs  . Financial resource strain: Not on file  . Food insecurity:    Worry: Not on file    Inability: Not on file  . Transportation needs:    Medical: Not on file    Non-medical: Not on file  Tobacco Use  . Smoking status: Never Smoker  . Smokeless tobacco: Never Used  Substance and Sexual Activity  . Alcohol use: No    Alcohol/week: 0.0 oz  . Drug use: No  . Sexual activity: Yes  Lifestyle  . Physical activity:    Days per week: Not on file    Minutes per session: Not on file  . Stress: Not on file  Relationships  . Social connections:    Talks on phone: Not on file    Gets together: Not on file    Attends religious service: Not on file    Active member of club or organization:  Not on file    Attends meetings of clubs or organizations: Not on file    Relationship status: Not on file  . Intimate partner violence:    Fear of current or ex partner: Not on file    Emotionally abused: Not on file    Physically abused: Not on file    Forced sexual activity: Not on file  Other Topics Concern  . Not on file  Social History Narrative  . Not on file    FAMILY HISTORY: Family History  Problem Relation Age of Onset  . Breast cancer Maternal Aunt 67       currently late 50s  . Breast cancer Maternal Grandmother 68       second breast vs. recurrence ca at 51; deceased at 73  . Colon cancer Maternal Uncle 50       deceased in 69s  . Breast cancer Paternal Aunt        age at dx unknown; currently in 87s  . Breast cancer Paternal Grandmother        age at dx unknown  . Melanoma Mother        on leg  . Testicular cancer Maternal Uncle 35       currently 25s    ALLERGIES:  is allergic to codeine.  MEDICATIONS:  Current Outpatient Medications  Medication Sig Dispense Refill  . acetaminophen (TYLENOL) 325 MG tablet Take 650 mg by mouth every 6 (six) hours as needed for moderate pain or headache.     . lidocaine-prilocaine (EMLA) cream Apply to affected area once (Patient taking differently: Apply 1 application topically as needed (for port access). ) 30 g 3  . loratadine (CLARITIN) 10 MG tablet Take 10 mg by mouth daily as needed for allergies.     . Multiple Vitamin (MULTI-VITAMINS) TABS Take 1 tablet by mouth daily.      No current facility-administered medications for this visit.    Facility-Administered Medications Ordered in Other Visits  Medication Dose Route Frequency Provider Last Rate Last Dose  . heparin lock flush 100 unit/mL  500 Units Intravenous Once [name], MD      . sodium chloride flush (NS) 0.9 % injection 10 mL  10 mL Intravenous PRN [name], MD   10 mL at 08/01/17 0829     PHYSICAL EXAMINATION: ECOG PERFORMANCE STATUS: 0 -  Asymptomatic Vitals:   01/12/18 1443  BP: (!) 163/88  Pulse: 98  Temp: 97.6 F (36.4 C)   Filed Weights   01/12/18 1443  Weight: 194 lb 8 oz (88.2 kg)    Physical Exam  Constitutional: She is oriented to person, place, and time and well-developed, well-nourished, and in no distress. No distress.  HENT:  Head: Normocephalic and atraumatic.  Right Ear: External ear normal.  Left Ear: External ear normal.  Nose: Nose normal.  Mouth/Throat: Oropharynx is clear and moist. No oropharyngeal exudate.  Eyes: Pupils are equal, round, and reactive to light. EOM are normal. Right eye exhibits no discharge. Left eye exhibits no discharge. No scleral icterus.  Neck: Normal range of motion. Neck supple. No JVD present.  Cardiovascular: Normal rate, regular rhythm, normal heart sounds and intact distal pulses. Exam reveals no friction rub.  No murmur heard. Pulmonary/Chest: Effort normal and breath sounds normal. No respiratory distress. She has no wheezes. She has no rales. She exhibits no tenderness.  Abdominal: Soft. Bowel sounds are normal. She exhibits no distension and no mass. There is no tenderness. There is no rebound and no guarding.  Musculoskeletal: Normal range of motion. She exhibits no edema, tenderness or deformity.  Lymphadenopathy:    She has no cervical adenopathy.  Neurological: She is alert and oriented to person, place, and time. No cranial nerve deficit. She exhibits normal muscle tone. Coordination normal.  Skin: Skin is warm and dry. No rash noted. She is not diaphoretic. No erythema. No pallor.  Psychiatric: Memory, affect and judgment normal.  Breast exam was performed in seated and lying down position. Patient is status post right breast lumpectomy with a well-healed surgical scar. No evidence of any palpable masses. No evidence of axillary adenopathy.    Lab Results  Component Value Date   WBC 7.5 01/12/2018   HGB 12.6 01/12/2018   HCT 37.2 01/12/2018   MCV 87.0  01/12/2018   PLT 338 01/12/2018   Recent Labs    11/21/17 1321 11/28/17 0827 01/12/18 1421  NA 136 135 135  K 4.3 4.2 3.9  CL 101 101 102  CO2 '24 24 23  ' GLUCOSE 88 94 94  BUN '10 10 15  ' CREATININE 0.64 0.51 0.53  CALCIUM 9.4 9.3 8.9  GFRNONAA >60 >60 >60  GFRAA >60 >60 >60  PROT 7.4 7.1 7.1  ALBUMIN 3.9 3.8 3.9  AST '24 26 25  ' ALT  '18 24 23  ' ALKPHOS 56 54 59  BILITOT 0.5 0.5 0.4    2D Echo was done which showed LVEF 55-65%, mild mitral valve regurgitation.  12/29/2017  Pathology Surgical Pathology  CASE: [id number]  DIAGNOSIS:  A. BREAST MASS, RIGHT; NEEDLE LOCALIZED LUMPECTOMY:  - RESIDUAL INVASIVE MAMMARY CARCINOMA, STATUS POST NEOADJUVANT CHEMOTHERAPY.  - DUCTAL CARCINOMA IN SITU.  - SEE CANCER SUMMARY BELOW.  - PRIOR BIOPSY SITE CHANGE WITH CLIP.   B. SENTINEL LYMPH NODE 1, RIGHT; EXCISION:  - ONE LYMPH NODE WITH FOCUS SUSPICIOUS FOR ISOLATED TUMOR CELLS.  - TWO LYMPH NODES NEGATIVE FOR MALIGNANCY.  - SEE COMMENT.   C. SENTINEL LYMPH NODE 2, RIGHT; EXCISION:  - ONE LYMPH NODE NEGATIVE FOR MALIGNANCY (0/1).   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  Margins:  Invasive Carcinoma Margins: Uninvolved by invasive carcinoma  Distance from closest margin: 2 mm  Specify closest margin: Superior  DCIS Margins: Uninvolved by DCIS  Distance from closest margin: 3 mm  Specify closest margin: Superior  Regional Lymph Nodes: Involved by tumor cells       Number of Lymph Nodes with Macrometastases (>2 mm): 0       Number of Lymph Nodes with  Micrometastases (>0.2 mm to 2 mm  or >200 cells): 0  Number of Lymph Nodes with Isolated Tumor Cells (=0.2 mm or =200 cells): 1  Size of Largest Metastatic Deposit: Less than 0.2 mm  Extranodal Extension: Not identified  Number of Lymph Nodes Examined: 4  Number of Sentinel Nodes Examined: 4  Treatment Effect:  Treatment Effect in the Breast: Definite response to presurgical therapy in the invasive carcinoma  Treatment Effect in the Lymph Nodes: Suspicious for isolated tumor cells, no prominent fibrous scarring in the nodes  Residual Cancer Burden (RCB) from MD [facility] calculator (website below):  Primary Tumor Bed  Primary tumor bed: 44 mm x 22 mm  Overall cancer cellularity: 10 %  Percentage of cancer that is in situ: 1 %  Lymph nodes  Number of lymph nodes positive for metastasis: 0  Residual Cancer Burden: 1.695  Residual Cancer Burden Class: RCB-II  [email or web address]  Lymphovascular Invasion: Present  Pathologic Stage Classification (pTNM, AJCC 8th Edition): ypT2 ypN0 (i+) (sn)  TNM Descriptors: y - neoadjuvant   BREAST BIOMARKER TESTS - performed on prior biopsy  Estrogen Receptor (ER) Status: POSITIVE, >90% nuclear staining  Progesterone Receptor (PgR) Status: POSITIVE, >90% nuclear staining  HER2 (by immunohistochemistry): EQUIVOCAL  HER2 (ERBB2) (by in situ hybridization): NEGATIVE  ASSESSMENT & PLAN:  1. Malignant neoplasm of upper-outer quadrant of right breast in female, estrogen receptor positive ([place])   2. Tachycardia    #ypT2 ypN0 (i+) (sn), grade 2 breast cancer, ER/PR positive, HER 2 negative.  Pathology was explained in details with patient.  S/p neoadjuvant chemotherapy.  Referred to RadOnc to finish Radiation treatment.  Check CT chest abdomen pelvis w contrast to rule out metastatic disease.  After she finishes RT. Plan start ovarian suppression+ aromatase inhibitor.  Patient and her husband had many  questions and I answered to their satisfaction.  Discussed with patient about clinical trials. They  prefer to discuss clinical trials after RT is finished.   # Tachycardia, 2D echo showed mitral regurgitation. Today's heart rate improved at 98.   #  All questions were answered. The patient knows to call the clinic with any problems questions or concerns.  Return visit: 6 weeks.  Total face to face encounter time for this patient visit was 25 min. >50% of the time was  spent in counseling and coordination of care.   [name], MD, PhD Hematology Oncology [facility] Pager- [phone] 01/13/2018

## 2018-01-19 ENCOUNTER — Ambulatory Visit: Admission: RE | Admit: 2018-01-19 | Payer: 59 | Source: Ambulatory Visit

## 2018-01-23 ENCOUNTER — Ambulatory Visit: Payer: 59 | Admitting: Oncology

## 2018-01-23 ENCOUNTER — Other Ambulatory Visit: Payer: 59

## 2018-01-26 ENCOUNTER — Ambulatory Visit
Admission: RE | Admit: 2018-01-26 | Discharge: 2018-01-26 | Disposition: A | Discharge status: Home / Self Care | Payer: 59 | Source: Ambulatory Visit | Attending: Oncology | Admitting: Oncology

## 2018-01-26 DIAGNOSIS — Z170 Estrogen receptor positive status [ER+]: Secondary | ICD-10-CM | POA: Insufficient documentation

## 2018-01-26 DIAGNOSIS — Z9889 Other specified postprocedural states: Secondary | ICD-10-CM | POA: Insufficient documentation

## 2018-01-26 DIAGNOSIS — C50411 Malignant neoplasm of upper-outer quadrant of right female breast: Secondary | ICD-10-CM | POA: Insufficient documentation

## 2018-01-26 DIAGNOSIS — N859 Noninflammatory disorder of uterus, unspecified: Secondary | ICD-10-CM | POA: Insufficient documentation

## 2018-01-26 MED ORDER — IOPAMIDOL (ISOVUE-300) INJECTION 61%
100.0000 mL | Freq: Once | INTRAVENOUS | Status: AC | PRN
  Administered 2018-01-26: 100 mL via INTRAVENOUS

## 2018-01-28 ENCOUNTER — Telehealth: Payer: Self-pay | Admitting: *Deleted

## 2018-01-28 NOTE — Telephone Encounter (Signed)
Patient called triage to check on her CT results. Informed patient that he scans did not show any evidence

## 2018-01-28 NOTE — Telephone Encounter (Signed)
Of malignancy.dhs

## 2018-02-02 ENCOUNTER — Encounter: Payer: Self-pay | Admitting: Radiation Oncology

## 2018-02-02 ENCOUNTER — Ambulatory Visit
Admission: RE | Admit: 2018-02-02 | Discharge: 2018-02-02 | Disposition: A | Discharge status: Home / Self Care | Payer: 59 | Source: Ambulatory Visit | Attending: Radiation Oncology | Admitting: Radiation Oncology

## 2018-02-02 ENCOUNTER — Other Ambulatory Visit: Payer: Self-pay

## 2018-02-02 VITALS — BP 147/92 | HR 104 | Temp 98.0°F | Resp 18 | Wt 192.7 lb

## 2018-02-02 DIAGNOSIS — Z170 Estrogen receptor positive status [ER+]: Principal | ICD-10-CM

## 2018-02-02 DIAGNOSIS — C50411 Malignant neoplasm of upper-outer quadrant of right female breast: Secondary | ICD-10-CM

## 2018-02-02 NOTE — Progress Notes (Signed)
Radiation Oncology Follow up Note  Name: Anne Wu   Date:   02/02/2018 MRN:  [id number] DOB: [birth year]    This 37 y.o. female presents to the clinic today for status post neoadjuvant chemotherapy and wide local excision and sentinel node biopsy for invasive mammary carcinoma of the right breast a T2 N0 lesion.  REFERRING PROVIDER: [name], NP  HPI: patient is a 37 year old female originally consult back in May 2019 when she had an invasive mammary carcinoma T2 N0 lesion status post neoadjuvant chemotherapy. She completed her neoadjuvant chemotherapy then underwent wide local excision.Marland Kitchenat the time of wide local excision tumor was 2.1 cm overall grade 2 with ductal carcinoma in situ present with comedonecrosis nuclear grade 2-3.margins were clear at 2 mm for the invasive component and 3 mm for the DCIS component.4 sentinel lymph nodes were examined one had isolated tumor cells present. There is also seeing definitive response to presurgical therapy in the pathology specimen. She is recovering nicely from her surgery. She is seen today for radiation oncology opinion. She specifically denies breast tendernesscough or bone pain.  COMPLICATIONS OF TREATMENT: none  FOLLOW UP COMPLIANCE: keeps appointments   PHYSICAL EXAM:  There were no vitals taken for this visit. Right breast is wide local excision which is healed well. No dominant mass or nodularity is noted in either breast in 2 positions examined. No axillary or supraclavicular adenopathy is appreciated.Well-developed well-nourished patient in NAD. HEENT reveals PERLA, EOMI, discs not visualized.  Oral cavity is clear. No oral mucosal lesions are identified. Neck is clear without evidence of cervical or supraclavicular adenopathy. Lungs are clear to A&P. Cardiac examination is essentially unremarkable with regular rate and rhythm without murmur rub or thrill. Abdomen is benign with no organomegaly or masses noted. Motor sensory and DTR  levels are equal and symmetric in the upper and lower extremities. Cranial nerves II through XII are grossly intact. Proprioception is intact. No peripheral adenopathy or edema is identified. No motor or sensory levels are noted. Crude visual fields are within normal range.  RADIOLOGY RESULTS: mammograms ultrasound are reviewed, surgical pathology report is reviewed  PLAN: at this time like to go ahead with right whole breast radiation as well as peripheral lymphatic radiation. Based on the one of 4 notes having isolated tumor cells feel it is important to treat her peripheral lymphatics with very low risk side effect profile. I will plan on delivering 5040 cGy to her right breast and peripheral lymphatics. Boost her scar another 1600 cGy using electron beam based on the close margin. Risks and benefits of treatment including skin reaction fatigue alteration of blood counts possible inclusion of superficial lung and minimal small chance of lymphedema of her right upper extremity all were discussed with the patient and her husband. I have suggested she continues exercising her right upper extremity. I have personally set up and ordered CT simulation.There will be extra effort by both professional staff as well as technical staff to coordinate and manage concurrent chemoradiation and ensuing side effects during her treatments.patient and husband both seem to comprehend my treatment plan well.  I would like to take this opportunity to thank you for allowing me to participate in the care of your patient.[name], MD

## 2018-02-05 ENCOUNTER — Other Ambulatory Visit: Payer: Self-pay | Admitting: *Deleted

## 2018-02-05 ENCOUNTER — Ambulatory Visit
Admission: RE | Admit: 2018-02-05 | Discharge: 2018-02-05 | Disposition: A | Discharge status: Home / Self Care | Payer: 59 | Source: Ambulatory Visit | Attending: Radiation Oncology | Admitting: Radiation Oncology

## 2018-02-05 DIAGNOSIS — C50411 Malignant neoplasm of upper-outer quadrant of right female breast: Secondary | ICD-10-CM | POA: Diagnosis not present

## 2018-02-05 DIAGNOSIS — Z510 Encounter for antineoplastic radiation therapy: Secondary | ICD-10-CM | POA: Diagnosis present

## 2018-02-05 DIAGNOSIS — Z170 Estrogen receptor positive status [ER+]: Secondary | ICD-10-CM | POA: Insufficient documentation

## 2018-02-10 DIAGNOSIS — Z510 Encounter for antineoplastic radiation therapy: Secondary | ICD-10-CM | POA: Diagnosis not present

## 2018-02-12 ENCOUNTER — Ambulatory Visit
Admission: RE | Admit: 2018-02-12 | Discharge: 2018-02-12 | Disposition: A | Discharge status: Home / Self Care | Payer: 59 | Source: Ambulatory Visit | Attending: Radiation Oncology | Admitting: Radiation Oncology

## 2018-02-12 DIAGNOSIS — Z170 Estrogen receptor positive status [ER+]: Secondary | ICD-10-CM | POA: Insufficient documentation

## 2018-02-12 DIAGNOSIS — C50411 Malignant neoplasm of upper-outer quadrant of right female breast: Secondary | ICD-10-CM | POA: Insufficient documentation

## 2018-02-12 DIAGNOSIS — Z510 Encounter for antineoplastic radiation therapy: Secondary | ICD-10-CM | POA: Insufficient documentation

## 2018-02-16 ENCOUNTER — Ambulatory Visit
Admission: RE | Admit: 2018-02-16 | Discharge: 2018-02-16 | Disposition: A | Discharge status: Home / Self Care | Payer: 59 | Source: Ambulatory Visit | Attending: Radiation Oncology | Admitting: Radiation Oncology

## 2018-02-16 DIAGNOSIS — Z510 Encounter for antineoplastic radiation therapy: Secondary | ICD-10-CM | POA: Diagnosis not present

## 2018-02-17 ENCOUNTER — Ambulatory Visit
Admission: RE | Admit: 2018-02-17 | Discharge: 2018-02-17 | Disposition: A | Discharge status: Home / Self Care | Payer: 59 | Source: Ambulatory Visit | Attending: Radiation Oncology | Admitting: Radiation Oncology

## 2018-02-17 DIAGNOSIS — Z510 Encounter for antineoplastic radiation therapy: Secondary | ICD-10-CM | POA: Diagnosis not present

## 2018-02-18 ENCOUNTER — Ambulatory Visit
Admission: RE | Admit: 2018-02-18 | Discharge: 2018-02-18 | Disposition: A | Discharge status: Home / Self Care | Payer: 59 | Source: Ambulatory Visit | Attending: Radiation Oncology | Admitting: Radiation Oncology

## 2018-02-18 DIAGNOSIS — Z510 Encounter for antineoplastic radiation therapy: Secondary | ICD-10-CM | POA: Diagnosis not present

## 2018-02-19 ENCOUNTER — Ambulatory Visit
Admission: RE | Admit: 2018-02-19 | Discharge: 2018-02-19 | Disposition: A | Discharge status: Home / Self Care | Payer: 59 | Source: Ambulatory Visit | Attending: Radiation Oncology | Admitting: Radiation Oncology

## 2018-02-19 DIAGNOSIS — Z510 Encounter for antineoplastic radiation therapy: Secondary | ICD-10-CM | POA: Diagnosis not present

## 2018-02-20 ENCOUNTER — Ambulatory Visit
Admission: RE | Admit: 2018-02-20 | Discharge: 2018-02-20 | Disposition: A | Discharge status: Home / Self Care | Payer: 59 | Source: Ambulatory Visit | Attending: Radiation Oncology | Admitting: Radiation Oncology

## 2018-02-20 DIAGNOSIS — Z510 Encounter for antineoplastic radiation therapy: Secondary | ICD-10-CM | POA: Diagnosis not present

## 2018-02-23 ENCOUNTER — Ambulatory Visit
Admission: RE | Admit: 2018-02-23 | Discharge: 2018-02-23 | Disposition: A | Discharge status: Home / Self Care | Payer: 59 | Source: Ambulatory Visit | Attending: Radiation Oncology | Admitting: Radiation Oncology

## 2018-02-23 DIAGNOSIS — Z510 Encounter for antineoplastic radiation therapy: Secondary | ICD-10-CM | POA: Diagnosis not present

## 2018-02-24 ENCOUNTER — Ambulatory Visit
Admission: RE | Admit: 2018-02-24 | Discharge: 2018-02-24 | Disposition: A | Discharge status: Home / Self Care | Payer: 59 | Source: Ambulatory Visit | Attending: Radiation Oncology | Admitting: Radiation Oncology

## 2018-02-24 DIAGNOSIS — Z510 Encounter for antineoplastic radiation therapy: Secondary | ICD-10-CM | POA: Diagnosis not present

## 2018-02-25 ENCOUNTER — Ambulatory Visit
Admission: RE | Admit: 2018-02-25 | Discharge: 2018-02-25 | Disposition: A | Discharge status: Home / Self Care | Payer: 59 | Source: Ambulatory Visit | Attending: Radiation Oncology | Admitting: Radiation Oncology

## 2018-02-25 DIAGNOSIS — Z510 Encounter for antineoplastic radiation therapy: Secondary | ICD-10-CM | POA: Diagnosis not present

## 2018-02-26 ENCOUNTER — Inpatient Hospital Stay: Payer: 59 | Attending: Oncology

## 2018-02-26 ENCOUNTER — Ambulatory Visit
Admission: RE | Admit: 2018-02-26 | Discharge: 2018-02-26 | Disposition: A | Discharge status: Home / Self Care | Payer: 59 | Source: Ambulatory Visit | Attending: Radiation Oncology | Admitting: Radiation Oncology

## 2018-02-26 DIAGNOSIS — Z170 Estrogen receptor positive status [ER+]: Secondary | ICD-10-CM | POA: Insufficient documentation

## 2018-02-26 DIAGNOSIS — Z510 Encounter for antineoplastic radiation therapy: Secondary | ICD-10-CM | POA: Diagnosis not present

## 2018-02-26 DIAGNOSIS — Z452 Encounter for adjustment and management of vascular access device: Secondary | ICD-10-CM | POA: Insufficient documentation

## 2018-02-26 DIAGNOSIS — C50411 Malignant neoplasm of upper-outer quadrant of right female breast: Secondary | ICD-10-CM | POA: Insufficient documentation

## 2018-02-26 DIAGNOSIS — Z9221 Personal history of antineoplastic chemotherapy: Secondary | ICD-10-CM | POA: Insufficient documentation

## 2018-02-26 LAB — CBC
HCT: 38.4 % (ref 35.0–47.0)
Hemoglobin: 13 g/dL (ref 12.0–16.0)
MCH: 29.3 pg (ref 26.0–34.0)
MCHC: 33.8 g/dL (ref 32.0–36.0)
MCV: 86.7 fL (ref 80.0–100.0)
Platelets: 278 10*3/uL (ref 150–440)
RBC: 4.43 MIL/uL (ref 3.80–5.20)
RDW: 12.7 % (ref 11.5–14.5)
WBC: 5.6 10*3/uL (ref 3.6–11.0)

## 2018-02-27 ENCOUNTER — Ambulatory Visit
Admission: RE | Admit: 2018-02-27 | Discharge: 2018-02-27 | Disposition: A | Discharge status: Home / Self Care | Payer: 59 | Source: Ambulatory Visit | Attending: Radiation Oncology | Admitting: Radiation Oncology

## 2018-02-27 DIAGNOSIS — Z510 Encounter for antineoplastic radiation therapy: Secondary | ICD-10-CM | POA: Diagnosis not present

## 2018-03-01 ENCOUNTER — Ambulatory Visit: Admission: RE | Admit: 2018-03-01 | Payer: 59 | Source: Ambulatory Visit

## 2018-03-02 ENCOUNTER — Other Ambulatory Visit: Payer: Self-pay

## 2018-03-02 ENCOUNTER — Inpatient Hospital Stay: Payer: 59

## 2018-03-02 ENCOUNTER — Inpatient Hospital Stay (HOSPITAL_BASED_OUTPATIENT_CLINIC_OR_DEPARTMENT_OTHER): Payer: 59 | Admitting: Oncology

## 2018-03-02 ENCOUNTER — Encounter: Payer: Self-pay | Admitting: Oncology

## 2018-03-02 ENCOUNTER — Ambulatory Visit: Payer: 59

## 2018-03-02 ENCOUNTER — Ambulatory Visit
Admission: RE | Admit: 2018-03-02 | Discharge: 2018-03-02 | Disposition: A | Discharge status: Home / Self Care | Payer: 59 | Source: Ambulatory Visit | Attending: Radiation Oncology | Admitting: Radiation Oncology

## 2018-03-02 VITALS — BP 126/85 | HR 105 | Temp 97.0°F | Resp 18 | Wt 193.4 lb

## 2018-03-02 DIAGNOSIS — Z9221 Personal history of antineoplastic chemotherapy: Secondary | ICD-10-CM | POA: Diagnosis not present

## 2018-03-02 DIAGNOSIS — Z170 Estrogen receptor positive status [ER+]: Secondary | ICD-10-CM | POA: Diagnosis not present

## 2018-03-02 DIAGNOSIS — C50411 Malignant neoplasm of upper-outer quadrant of right female breast: Secondary | ICD-10-CM

## 2018-03-02 DIAGNOSIS — Z510 Encounter for antineoplastic radiation therapy: Secondary | ICD-10-CM | POA: Diagnosis not present

## 2018-03-02 MED ORDER — SODIUM CHLORIDE 0.9% FLUSH
10.0000 mL | Freq: Once | INTRAVENOUS | Status: AC
  Administered 2018-03-02: 10 mL via INTRAVENOUS
  Filled 2018-03-02: qty 10

## 2018-03-02 MED ORDER — HEPARIN SOD (PORK) LOCK FLUSH 100 UNIT/ML IV SOLN
500.0000 [IU] | Freq: Once | INTRAVENOUS | Status: AC
  Administered 2018-03-02: 500 [IU] via INTRAVENOUS

## 2018-03-02 NOTE — Progress Notes (Signed)
Hematology/Oncology Follow up note [facility] [phone] [phone]   Patient Care Team: [name], NP as PCP - General (Nurse Practitioner)  REASON FOR VISIT Follow up for management of breast cancer   HISTORY OF PRESENTING ILLNESS:  Anne Wu is a  37 y.o.  female with diagnosis of cT2N0M0, grade 2 invasive mammary carcinoma.  She has significant family history on both maternal side as well as paternal side. She felt a right breast mass 2 months ago and diagnostic mammogram showed right upper quandrant 2.2 cm mass, which was confirmed on [place] and biopsied. Right axillary no clinically suspicious lymph node. Pathology showed invasive mammary carcinoma, ER/PR positive,, HER2 IHC  equivocal, FISH negative.   # Patient was evaluated by Dr.Smith. From surgical standpoint, Dr.Smith recommend neoadjuvant chemotherapy to facilitate surgery to obtain negative margin and better surgical outcome. Also given patient's young age, neoadjuvant chemotherapy is reasonable.    #Patient stated that she does not desire future fertility.  # 2D Echo showed normal systolic function, mild mitral regurgitation.   # Testing did not reveal any pathogenic mutation in any of these genes. A copy of the genetic test report will be scanned into Epic under the Media tab.The genes analyzed were the 23 genes on [facility]'s Breast/GYN panel (ATM, BARD1, BRCA1, BRCA2, BRIP1, CDH1, CHEK2, DICER1, EPCAM, MLH1,  MSH2, MSH6, NBN, NF1, PALB2, PMS2, PTEN, RAD50, RAD51C, RAD51D,SMARCA4, STK11, and TP53).  09/08/2017 Interval mammogram after 4 cycle of AC, showed good treatment response. The biopsy-proven malignancy in the upper outer quadrant of the right breast at posterior depth, associated with scattered microcalcifications and architectural distortion, has significantly decreased in size since the mammogram 06/20/2017. On today's mammogram, the mass measures approximately 1.7 x  2.2 x 1.2 cm (previously 2.4 x 1.6 x 2.7 cm).    # Pathology showed ypT2 ypN0 (i+) (sn) case was discussed on tumor board. INTERVAL HISTORY Anne Wu is a 37 y.o. female who has above history reviewed by me presented for follow-up of breast cancer treatment. Currently she is undergoing radiation treatment.   Reports some focal skin erythema, otherwise tolerating radiation well.    Review of Systems  Constitutional: Negative for chills, fever, malaise/fatigue and weight loss.  HENT: Negative for congestion, ear discharge, ear pain, hearing loss, nosebleeds, sinus pain and sore throat.   Eyes: Negative for double vision, photophobia, pain, discharge and redness.  Respiratory: Negative for cough, hemoptysis, sputum production, shortness of breath and wheezing.   Cardiovascular: Negative for chest pain, palpitations, orthopnea, claudication and leg swelling.  Gastrointestinal: Negative for abdominal pain, blood in stool, constipation, diarrhea, heartburn, melena, nausea and vomiting.  Genitourinary: Negative for dysuria, flank pain, frequency, hematuria and urgency.  Musculoskeletal: Negative for back pain, myalgias and neck pain.  Skin: Negative for itching and rash.  Neurological: Negative for dizziness, tingling, tremors, sensory change, focal weakness, weakness and headaches.  Endo/Heme/Allergies: Negative for environmental allergies and polydipsia. Does not bruise/bleed easily.  Psychiatric/Behavioral: Negative for depression, hallucinations, substance abuse and suicidal ideas. The patient is not nervous/anxious.     MEDICAL HISTORY:  Past Medical History:  Diagnosis Date  . Asthma    AS A CHILD-NO INHALERS  . Breast cancer ([place])   . Elevated blood pressure, situational    PT STATES HER LAST COUPLE OF MD APPOINTMENTS SHE HAS HAD ELEVATED BP-NEVER HAD A PROBLEM WITH THIS PREVIOUSLY  . Genetic testing 09/05/2017   Breast/GYN panel (23 genes) @ [facility] - No pathogenic mutations  detected  . GERD (gastroesophageal reflux disease)    OCC- NO MEDS  . Personal history of chemotherapy     SURGICAL HISTORY: Past Surgical History:  Procedure Laterality Date  . BREAST BIOPSY Right 06/20/2017   Invasive Mammary Carcinoma  . BREAST EXCISIONAL BIOPSY Right 12/29/2017   lumpectomy with NL  . BREAST LUMPECTOMY Right 12/29/2017   needle localization and sentinel node injection  . NO PAST SURGERIES    . PARTIAL MASTECTOMY WITH NEEDLE LOCALIZATION Right 12/29/2017   Procedure: PARTIAL MASTECTOMY WITH NEEDLE LOCALIZATION;  Surgeon: [name], MD;  Location: [facility] ORS;  Service: General;  Laterality: Right;  . PORTACATH PLACEMENT N/A 07/11/2017   Procedure: INSERTION PORT-A-CATH;  Surgeon: Leonie Green, MD;  Location: [facility] ORS;  Service: General;  Laterality: N/A;  . SENTINEL NODE BIOPSY Right 12/29/2017   Procedure: SENTINEL NODE BIOPSY;  Surgeon: [name], MD;  Location: [facility] ORS;  Service: General;  Laterality: Right;    SOCIAL HISTORY: Social History   Socioeconomic History  . Marital status: Married    Spouse name: Not on file  . Number of children: Not on file  . Years of education: Not on file  . Highest education level: Not on file  Occupational History  . Not on file  Social Needs  . Financial resource strain: Not on file  . Food insecurity:    Worry: Not on file    Inability: Not on file  . Transportation needs:    Medical: Not on file    Non-medical: Not on file  Tobacco Use  . Smoking status: Never Smoker  . Smokeless tobacco: Never Used  Substance and Sexual Activity  . Alcohol use: No    Alcohol/week: 0.0 standard drinks  . Drug use: No  . Sexual activity: Yes  Lifestyle  . Physical activity:    Days per week: Not on file    Minutes per session: Not on file  . Stress: Not on file  Relationships  . Social connections:    Talks on phone: Not on file    Gets together: Not on file    Attends religious service:  Not on file    Active member of club or organization: Not on file    Attends meetings of clubs or organizations: Not on file    Relationship status: Not on file  . Intimate partner violence:    Fear of current or ex partner: Not on file    Emotionally abused: Not on file    Physically abused: Not on file    Forced sexual activity: Not on file  Other Topics Concern  . Not on file  Social History Narrative  . Not on file    FAMILY HISTORY: Family History  Problem Relation Age of Onset  . Breast cancer Maternal Aunt 5       currently late 92s  . Breast cancer Maternal Grandmother 68       second breast vs. recurrence ca at 106; deceased at 61  . Colon cancer Maternal Uncle 60       deceased in 10s  . Breast cancer Paternal Aunt        age at dx unknown; currently in 68s  . Breast cancer Paternal Grandmother        age at dx unknown  . Melanoma Mother        on leg  . Testicular cancer Maternal Uncle 28       currently 60s    ALLERGIES:  is allergic to codeine.  MEDICATIONS:  Current Outpatient Medications  Medication Sig Dispense Refill  . acetaminophen (TYLENOL) 325 MG tablet Take 650 mg by mouth every 6 (six) hours as needed for moderate pain or headache.     . Multiple Vitamin (MULTI-VITAMINS) TABS Take 1 tablet by mouth daily.     Marland Kitchen lidocaine-prilocaine (EMLA) cream Apply to affected area once (Patient not taking: Reported on 03/02/2018) 30 g 3  . loratadine (CLARITIN) 10 MG tablet Take 10 mg by mouth daily as needed for allergies.      No current facility-administered medications for this visit.    Facility-Administered Medications Ordered in Other Visits  Medication Dose Route Frequency Provider Last Rate Last Dose  . heparin lock flush 100 unit/mL  500 Units Intravenous Once [name], MD      . sodium chloride flush (NS) 0.9 % injection 10 mL  10 mL Intravenous PRN [name], MD   10 mL at 08/01/17 0829     PHYSICAL EXAMINATION: ECOG PERFORMANCE STATUS: 0 -  Asymptomatic Vitals:   03/02/18 1405  BP: 126/85  Pulse: (!) 105  Resp: 18  Temp: (!) 97 F (36.1 C)   Filed Weights   03/02/18 1405  Weight: 193 lb 6.4 oz (87.7 kg)    Physical Exam  Constitutional: She is oriented to person, place, and time and well-developed, well-nourished, and in no distress. No distress.  HENT:  Head: Normocephalic and atraumatic.  Right Ear: External ear normal.  Left Ear: External ear normal.  Nose: Nose normal.  Mouth/Throat: Oropharynx is clear and moist. No oropharyngeal exudate.  Eyes: Pupils are equal, round, and reactive to light. EOM are normal. Right eye exhibits no discharge. Left eye exhibits no discharge. No scleral icterus.  Neck: Normal range of motion. Neck supple. No JVD present.  Cardiovascular: Normal rate, regular rhythm, normal heart sounds and intact distal pulses. Exam reveals no friction rub.  No murmur heard. Pulmonary/Chest: Effort normal and breath sounds normal. No respiratory distress. She has no wheezes. She has no rales. She exhibits no tenderness.  Abdominal: Soft. Bowel sounds are normal. She exhibits no distension and no mass. There is no tenderness. There is no rebound and no guarding.  Musculoskeletal: Normal range of motion. She exhibits no edema, tenderness or deformity.  Lymphadenopathy:    She has no cervical adenopathy.  Neurological: She is alert and oriented to person, place, and time. No cranial nerve deficit. She exhibits normal muscle tone. Coordination normal.  Skin: Skin is warm and dry. No rash noted. She is not diaphoretic. No erythema. No pallor.  Psychiatric: Memory, affect and judgment normal.  Breast exam was performed in seated and lying down position. Patient is status post right breast lumpectomy with a well-healed surgical scar. No evidence of any palpable masses. No evidence of axillary adenopathy.    Lab Results  Component Value Date   WBC 5.6 02/26/2018   HGB 13.0 02/26/2018   HCT 38.4  02/26/2018   MCV 86.7 02/26/2018   PLT 278 02/26/2018   Recent Labs    11/21/17 1321 11/28/17 0827 01/12/18 1421  NA 136 135 135  K 4.3 4.2 3.9  CL 101 101 102  CO2 _0 GLUCOSE 88 94 94  BUN _1 CREATININE 0.64 0.51 0.53  CALCIUM 9.4 9.3 8.9  GFRNONAA >60 >60 >60  GFRAA >60 >60 >60  PROT 7.4 7.1 7.1  ALBUMIN 3.9 3.8 3.9  AST 24 26 25  ALT _0 ALKPHOS 56 54 59  BILITOT 0.5 0.5 0.4    2D Echo was done which showed LVEF 55-65%, mild mitral valve regurgitation.  12/29/2017  Pathology Surgical Pathology  CASE: [phone]  DIAGNOSIS:  A. BREAST MASS, RIGHT; NEEDLE LOCALIZED LUMPECTOMY:  - RESIDUAL INVASIVE MAMMARY CARCINOMA, STATUS POST NEOADJUVANT CHEMOTHERAPY.  - DUCTAL CARCINOMA IN SITU.  - SEE CANCER SUMMARY BELOW.  - PRIOR BIOPSY SITE CHANGE WITH CLIP.   B. SENTINEL LYMPH NODE 1, RIGHT; EXCISION:  - ONE LYMPH NODE WITH FOCUS SUSPICIOUS FOR ISOLATED TUMOR CELLS.  - TWO LYMPH NODES NEGATIVE FOR MALIGNANCY.  - SEE COMMENT.   C. SENTINEL LYMPH NODE 2, RIGHT; EXCISION:  - ONE LYMPH NODE NEGATIVE FOR MALIGNANCY (0/1).   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  Margins:  Invasive Carcinoma Margins: Uninvolved by invasive carcinoma  Distance from closest margin: 2 mm  Specify closest margin: Superior  DCIS Margins: Uninvolved by DCIS  Distance from closest margin: 3 mm  Specify closest margin: Superior  Regional Lymph Nodes: Involved by tumor cells       Number of Lymph Nodes with Macrometastases (>2 mm): 0       Number of Lymph  Nodes with Micrometastases (>0.2 mm to 2 mm  or >200 cells): 0  Number of Lymph Nodes with Isolated Tumor Cells (=0.2 mm or =200 cells): 1  Size of Largest Metastatic Deposit: Less than 0.2 mm  Extranodal Extension: Not identified  Number of Lymph Nodes Examined: 4  Number of Sentinel Nodes Examined: 4  Treatment Effect:  Treatment Effect in the Breast: Definite response to presurgical therapy in the invasive carcinoma  Treatment Effect in the Lymph Nodes: Suspicious for isolated tumor cells, no prominent fibrous scarring in the nodes  Residual Cancer Burden (RCB) from [facility] calculator (website below):  Primary Tumor Bed  Primary tumor bed: 44 mm x 22 mm  Overall cancer cellularity: 10 %  Percentage of cancer that is in situ: 1 %  Lymph nodes  Number of lymph nodes positive for metastasis: 0  Residual Cancer Burden: 1.695  Residual Cancer Burden Class: RCB-II  [email or web address]  Lymphovascular Invasion: Present  Pathologic Stage Classification (pTNM, AJCC 8th Edition): ypT2 ypN0 (i+) (sn)  TNM Descriptors: y - neoadjuvant   BREAST BIOMARKER TESTS - performed on prior biopsy  Estrogen Receptor (ER) Status: POSITIVE, >90% nuclear staining  Progesterone Receptor (PgR) Status: POSITIVE, >90% nuclear staining  HER2 (by immunohistochemistry): EQUIVOCAL  HER2 (ERBB2) (by in situ hybridization): NEGATIVE  ASSESSMENT & PLAN:  1. Malignant neoplasm of upper-outer quadrant of right breast in female, estrogen receptor positive ([place])   Cancer Staging Malignant neoplasm of upper-outer quadrant of right breast in female, estrogen receptor positive ([place]) Staging form: Breast, AJCC 8th Edition - Clinical stage from 07/15/2017: Stage IB (cT2, cN0, cM0, G2, ER: Positive, PR: Positive, HER2: Negative) - Signed by [name], MD on 07/15/2017  #ypT2 ypN0 (i+) (sn), grade 2 breast cancer, ER/PR positive, HER 2 negative.  CT was done during interval  to rule out metastatic disease given micrometastasis. Images were independently reviewed by me and discussed with  patient.  Negative for metastatic disease. #Continue follow-up with radiation oncology to finish her course of adjuvant radiation. #Today we discussed about the rationale and possible side effects of ovarian suppression plus aromatase inhibitor. After she finishes radiation, plan starting ovarian suppression with Goreselin plus letrozole.      All questions were answered. The patient knows to call the clinic with any problems questions or concerns.  Return visit: 6 weeks.  Total face to face encounter time for this patient visit was 15 min. >50% of the time was  spent in counseling and coordination of care.    [name], MD, PhD Hematology Oncology [facility] Pager- [phone] 03/02/2018

## 2018-03-03 ENCOUNTER — Ambulatory Visit
Admission: RE | Admit: 2018-03-03 | Discharge: 2018-03-03 | Disposition: A | Discharge status: Home / Self Care | Payer: 59 | Source: Ambulatory Visit | Attending: Radiation Oncology | Admitting: Radiation Oncology

## 2018-03-03 DIAGNOSIS — Z510 Encounter for antineoplastic radiation therapy: Secondary | ICD-10-CM | POA: Diagnosis not present

## 2018-03-04 ENCOUNTER — Ambulatory Visit
Admission: RE | Admit: 2018-03-04 | Discharge: 2018-03-04 | Disposition: A | Discharge status: Home / Self Care | Payer: 59 | Source: Ambulatory Visit | Attending: Radiation Oncology | Admitting: Radiation Oncology

## 2018-03-04 DIAGNOSIS — Z510 Encounter for antineoplastic radiation therapy: Secondary | ICD-10-CM | POA: Diagnosis not present

## 2018-03-05 ENCOUNTER — Ambulatory Visit
Admission: RE | Admit: 2018-03-05 | Discharge: 2018-03-05 | Disposition: A | Discharge status: Home / Self Care | Payer: 59 | Source: Ambulatory Visit | Attending: Radiation Oncology | Admitting: Radiation Oncology

## 2018-03-05 DIAGNOSIS — Z510 Encounter for antineoplastic radiation therapy: Secondary | ICD-10-CM | POA: Diagnosis not present

## 2018-03-06 ENCOUNTER — Ambulatory Visit
Admission: RE | Admit: 2018-03-06 | Discharge: 2018-03-06 | Disposition: A | Discharge status: Home / Self Care | Payer: 59 | Source: Ambulatory Visit | Attending: Radiation Oncology | Admitting: Radiation Oncology

## 2018-03-06 DIAGNOSIS — Z510 Encounter for antineoplastic radiation therapy: Secondary | ICD-10-CM | POA: Diagnosis not present

## 2018-03-09 ENCOUNTER — Ambulatory Visit
Admission: RE | Admit: 2018-03-09 | Discharge: 2018-03-09 | Disposition: A | Discharge status: Home / Self Care | Payer: 59 | Source: Ambulatory Visit | Attending: Radiation Oncology | Admitting: Radiation Oncology

## 2018-03-09 DIAGNOSIS — Z510 Encounter for antineoplastic radiation therapy: Secondary | ICD-10-CM | POA: Diagnosis not present

## 2018-03-10 ENCOUNTER — Ambulatory Visit
Admission: RE | Admit: 2018-03-10 | Discharge: 2018-03-10 | Disposition: A | Discharge status: Home / Self Care | Payer: 59 | Source: Ambulatory Visit | Attending: Radiation Oncology | Admitting: Radiation Oncology

## 2018-03-10 DIAGNOSIS — Z510 Encounter for antineoplastic radiation therapy: Secondary | ICD-10-CM | POA: Diagnosis not present

## 2018-03-11 ENCOUNTER — Ambulatory Visit
Admission: RE | Admit: 2018-03-11 | Discharge: 2018-03-11 | Disposition: A | Discharge status: Home / Self Care | Payer: 59 | Source: Ambulatory Visit | Attending: Radiation Oncology | Admitting: Radiation Oncology

## 2018-03-11 DIAGNOSIS — Z510 Encounter for antineoplastic radiation therapy: Secondary | ICD-10-CM | POA: Diagnosis not present

## 2018-03-12 ENCOUNTER — Inpatient Hospital Stay: Payer: 59

## 2018-03-12 ENCOUNTER — Ambulatory Visit
Admission: RE | Admit: 2018-03-12 | Discharge: 2018-03-12 | Disposition: A | Discharge status: Home / Self Care | Payer: 59 | Source: Ambulatory Visit | Attending: Radiation Oncology | Admitting: Radiation Oncology

## 2018-03-12 DIAGNOSIS — C50411 Malignant neoplasm of upper-outer quadrant of right female breast: Secondary | ICD-10-CM

## 2018-03-12 DIAGNOSIS — Z170 Estrogen receptor positive status [ER+]: Principal | ICD-10-CM

## 2018-03-12 DIAGNOSIS — Z510 Encounter for antineoplastic radiation therapy: Secondary | ICD-10-CM | POA: Diagnosis not present

## 2018-03-12 LAB — CBC
HCT: 36.8 % (ref 35.0–47.0)
Hemoglobin: 12.7 g/dL (ref 12.0–16.0)
MCH: 29.6 pg (ref 26.0–34.0)
MCHC: 34.5 g/dL (ref 32.0–36.0)
MCV: 85.9 fL (ref 80.0–100.0)
PLATELETS: 264 10*3/uL (ref 150–440)
RBC: 4.28 MIL/uL (ref 3.80–5.20)
RDW: 12.9 % (ref 11.5–14.5)
WBC: 6.7 10*3/uL (ref 3.6–11.0)

## 2018-03-13 ENCOUNTER — Ambulatory Visit
Admission: RE | Admit: 2018-03-13 | Discharge: 2018-03-13 | Disposition: A | Discharge status: Home / Self Care | Payer: 59 | Source: Ambulatory Visit | Attending: Radiation Oncology | Admitting: Radiation Oncology

## 2018-03-13 DIAGNOSIS — Z510 Encounter for antineoplastic radiation therapy: Secondary | ICD-10-CM | POA: Diagnosis not present

## 2018-03-17 ENCOUNTER — Ambulatory Visit
Admission: RE | Admit: 2018-03-17 | Discharge: 2018-03-17 | Disposition: A | Discharge status: Home / Self Care | Payer: 59 | Source: Ambulatory Visit | Attending: Radiation Oncology | Admitting: Radiation Oncology

## 2018-03-17 ENCOUNTER — Other Ambulatory Visit: Payer: Self-pay | Admitting: *Deleted

## 2018-03-17 DIAGNOSIS — Z510 Encounter for antineoplastic radiation therapy: Secondary | ICD-10-CM | POA: Insufficient documentation

## 2018-03-17 DIAGNOSIS — Z170 Estrogen receptor positive status [ER+]: Secondary | ICD-10-CM | POA: Diagnosis not present

## 2018-03-17 DIAGNOSIS — C50411 Malignant neoplasm of upper-outer quadrant of right female breast: Secondary | ICD-10-CM | POA: Insufficient documentation

## 2018-03-17 MED ORDER — SILVER SULFADIAZINE 1 % EX CREA: 1.0000 "application " | TOPICAL_CREAM | Freq: Two times a day (BID) | CUTANEOUS | 2 refills | Status: DC

## 2018-03-18 ENCOUNTER — Ambulatory Visit
Admission: RE | Admit: 2018-03-18 | Discharge: 2018-03-18 | Disposition: A | Discharge status: Home / Self Care | Payer: 59 | Source: Ambulatory Visit | Attending: Radiation Oncology | Admitting: Radiation Oncology

## 2018-03-18 DIAGNOSIS — Z510 Encounter for antineoplastic radiation therapy: Secondary | ICD-10-CM | POA: Diagnosis not present

## 2018-03-19 ENCOUNTER — Ambulatory Visit
Admission: RE | Admit: 2018-03-19 | Discharge: 2018-03-19 | Disposition: A | Discharge status: Home / Self Care | Payer: 59 | Source: Ambulatory Visit | Attending: Radiation Oncology | Admitting: Radiation Oncology

## 2018-03-19 DIAGNOSIS — Z510 Encounter for antineoplastic radiation therapy: Secondary | ICD-10-CM | POA: Diagnosis not present

## 2018-03-20 ENCOUNTER — Ambulatory Visit
Admission: RE | Admit: 2018-03-20 | Discharge: 2018-03-20 | Disposition: A | Discharge status: Home / Self Care | Payer: 59 | Source: Ambulatory Visit | Attending: Radiation Oncology | Admitting: Radiation Oncology

## 2018-03-20 DIAGNOSIS — Z510 Encounter for antineoplastic radiation therapy: Secondary | ICD-10-CM | POA: Diagnosis not present

## 2018-03-23 ENCOUNTER — Ambulatory Visit
Admission: RE | Admit: 2018-03-23 | Discharge: 2018-03-23 | Disposition: A | Discharge status: Home / Self Care | Payer: 59 | Source: Ambulatory Visit | Attending: Radiation Oncology | Admitting: Radiation Oncology

## 2018-03-23 DIAGNOSIS — Z510 Encounter for antineoplastic radiation therapy: Secondary | ICD-10-CM | POA: Diagnosis not present

## 2018-03-24 ENCOUNTER — Ambulatory Visit
Admission: RE | Admit: 2018-03-24 | Discharge: 2018-03-24 | Disposition: A | Discharge status: Home / Self Care | Payer: 59 | Source: Ambulatory Visit | Attending: Radiation Oncology | Admitting: Radiation Oncology

## 2018-03-24 DIAGNOSIS — Z510 Encounter for antineoplastic radiation therapy: Secondary | ICD-10-CM | POA: Diagnosis not present

## 2018-03-25 ENCOUNTER — Ambulatory Visit
Admission: RE | Admit: 2018-03-25 | Discharge: 2018-03-25 | Disposition: A | Discharge status: Home / Self Care | Payer: 59 | Source: Ambulatory Visit | Attending: Radiation Oncology | Admitting: Radiation Oncology

## 2018-03-25 DIAGNOSIS — Z510 Encounter for antineoplastic radiation therapy: Secondary | ICD-10-CM | POA: Diagnosis not present

## 2018-03-26 ENCOUNTER — Ambulatory Visit
Admission: RE | Admit: 2018-03-26 | Discharge: 2018-03-26 | Disposition: A | Discharge status: Home / Self Care | Payer: 59 | Source: Ambulatory Visit | Attending: Radiation Oncology | Admitting: Radiation Oncology

## 2018-03-26 DIAGNOSIS — Z510 Encounter for antineoplastic radiation therapy: Secondary | ICD-10-CM | POA: Diagnosis not present

## 2018-03-27 ENCOUNTER — Ambulatory Visit
Admission: RE | Admit: 2018-03-27 | Discharge: 2018-03-27 | Disposition: A | Discharge status: Home / Self Care | Payer: 59 | Source: Ambulatory Visit | Attending: Radiation Oncology | Admitting: Radiation Oncology

## 2018-03-27 DIAGNOSIS — Z510 Encounter for antineoplastic radiation therapy: Secondary | ICD-10-CM | POA: Diagnosis not present

## 2018-03-30 ENCOUNTER — Ambulatory Visit
Admission: RE | Admit: 2018-03-30 | Discharge: 2018-03-30 | Disposition: A | Discharge status: Home / Self Care | Payer: 59 | Source: Ambulatory Visit | Attending: Radiation Oncology | Admitting: Radiation Oncology

## 2018-03-30 DIAGNOSIS — Z510 Encounter for antineoplastic radiation therapy: Secondary | ICD-10-CM | POA: Diagnosis not present

## 2018-03-31 ENCOUNTER — Ambulatory Visit
Admission: RE | Admit: 2018-03-31 | Discharge: 2018-03-31 | Disposition: A | Discharge status: Home / Self Care | Payer: 59 | Source: Ambulatory Visit | Attending: Radiation Oncology | Admitting: Radiation Oncology

## 2018-03-31 DIAGNOSIS — Z510 Encounter for antineoplastic radiation therapy: Secondary | ICD-10-CM | POA: Diagnosis not present

## 2018-04-01 ENCOUNTER — Ambulatory Visit
Admission: RE | Admit: 2018-04-01 | Discharge: 2018-04-01 | Disposition: A | Discharge status: Home / Self Care | Payer: 59 | Source: Ambulatory Visit | Attending: Radiation Oncology | Admitting: Radiation Oncology

## 2018-04-01 DIAGNOSIS — Z510 Encounter for antineoplastic radiation therapy: Secondary | ICD-10-CM | POA: Diagnosis not present

## 2018-04-02 ENCOUNTER — Ambulatory Visit
Admission: RE | Admit: 2018-04-02 | Discharge: 2018-04-02 | Disposition: A | Discharge status: Home / Self Care | Payer: 59 | Source: Ambulatory Visit | Attending: Radiation Oncology | Admitting: Radiation Oncology

## 2018-04-02 DIAGNOSIS — Z510 Encounter for antineoplastic radiation therapy: Secondary | ICD-10-CM | POA: Diagnosis not present

## 2018-04-03 ENCOUNTER — Ambulatory Visit
Admission: RE | Admit: 2018-04-03 | Discharge: 2018-04-03 | Disposition: A | Discharge status: Home / Self Care | Payer: 59 | Source: Ambulatory Visit | Attending: Radiation Oncology | Admitting: Radiation Oncology

## 2018-04-03 DIAGNOSIS — Z510 Encounter for antineoplastic radiation therapy: Secondary | ICD-10-CM | POA: Diagnosis not present

## 2018-04-06 ENCOUNTER — Ambulatory Visit
Admission: RE | Admit: 2018-04-06 | Discharge: 2018-04-06 | Disposition: A | Discharge status: Home / Self Care | Payer: 59 | Source: Ambulatory Visit | Attending: Radiation Oncology | Admitting: Radiation Oncology

## 2018-04-06 DIAGNOSIS — Z510 Encounter for antineoplastic radiation therapy: Secondary | ICD-10-CM | POA: Diagnosis not present

## 2018-04-07 ENCOUNTER — Ambulatory Visit
Admission: RE | Admit: 2018-04-07 | Discharge: 2018-04-07 | Disposition: A | Discharge status: Home / Self Care | Payer: 59 | Source: Ambulatory Visit | Attending: Radiation Oncology | Admitting: Radiation Oncology

## 2018-04-07 DIAGNOSIS — Z510 Encounter for antineoplastic radiation therapy: Secondary | ICD-10-CM | POA: Diagnosis not present

## 2018-04-15 ENCOUNTER — Inpatient Hospital Stay: Payer: 59 | Attending: Oncology | Admitting: Oncology

## 2018-04-15 ENCOUNTER — Other Ambulatory Visit: Payer: Self-pay

## 2018-04-15 ENCOUNTER — Inpatient Hospital Stay: Payer: 59

## 2018-04-15 ENCOUNTER — Encounter: Payer: Self-pay | Admitting: Oncology

## 2018-04-15 VITALS — BP 136/87 | HR 96 | Temp 96.4°F | Resp 18 | Wt 192.6 lb

## 2018-04-15 DIAGNOSIS — Z170 Estrogen receptor positive status [ER+]: Principal | ICD-10-CM

## 2018-04-15 DIAGNOSIS — C50411 Malignant neoplasm of upper-outer quadrant of right female breast: Secondary | ICD-10-CM | POA: Diagnosis not present

## 2018-04-15 DIAGNOSIS — Z9221 Personal history of antineoplastic chemotherapy: Secondary | ICD-10-CM

## 2018-04-15 DIAGNOSIS — N951 Menopausal and female climacteric states: Secondary | ICD-10-CM | POA: Diagnosis not present

## 2018-04-15 DIAGNOSIS — Z95828 Presence of other vascular implants and grafts: Secondary | ICD-10-CM

## 2018-04-15 DIAGNOSIS — Z79899 Other long term (current) drug therapy: Secondary | ICD-10-CM

## 2018-04-15 DIAGNOSIS — M2550 Pain in unspecified joint: Secondary | ICD-10-CM | POA: Diagnosis not present

## 2018-04-15 LAB — COMPREHENSIVE METABOLIC PANEL
ALBUMIN: 4.1 g/dL (ref 3.5–5.0)
ALK PHOS: 64 U/L (ref 38–126)
ALT: 27 U/L (ref 0–44)
AST: 30 U/L (ref 15–41)
Anion gap: 8 (ref 5–15)
BILIRUBIN TOTAL: 0.4 mg/dL (ref 0.3–1.2)
BUN: 15 mg/dL (ref 6–20)
CO2: 26 mmol/L (ref 22–32)
Calcium: 9.3 mg/dL (ref 8.9–10.3)
Chloride: 104 mmol/L (ref 98–111)
Creatinine, Ser: 0.56 mg/dL (ref 0.44–1.00)
GFR calc Af Amer: >60 mL/min (ref >60)
GFR calc non Af Amer: >60 mL/min (ref >60)
Glucose, Bld: 112 mg/dL — ABNORMAL HIGH (ref 70–99)
POTASSIUM: 3.8 mmol/L (ref 3.5–5.1)
Sodium: 138 mmol/L (ref 135–145)
TOTAL PROTEIN: 7.2 g/dL (ref 6.5–8.1)

## 2018-04-15 LAB — CBC WITH DIFFERENTIAL/PLATELET
BASOS ABS: 0 10*3/uL (ref 0–0.1)
BASOS PCT: 1 %
Eosinophils Absolute: 0.1 10*3/uL (ref 0–0.7)
Eosinophils Relative: 1 %
HEMATOCRIT: 38.2 % (ref 35.0–47.0)
HEMOGLOBIN: 13 g/dL (ref 12.0–16.0)
Lymphocytes Relative: 18 %
Lymphs Abs: 1 10*3/uL (ref 1.0–3.6)
MCH: 29.1 pg (ref 26.0–34.0)
MCHC: 34 g/dL (ref 32.0–36.0)
MCV: 85.7 fL (ref 80.0–100.0)
Monocytes Absolute: 0.5 10*3/uL (ref 0.2–0.9)
Monocytes Relative: 8 %
Neutro Abs: 4.1 10*3/uL (ref 1.4–6.5)
Neutrophils Relative %: 72 %
Platelets: 260 10*3/uL (ref 150–440)
RBC: 4.46 MIL/uL (ref 3.80–5.20)
RDW: 12.6 % (ref 11.5–14.5)
WBC: 5.7 10*3/uL (ref 3.6–11.0)

## 2018-04-15 MED ORDER — SODIUM CHLORIDE 0.9% FLUSH
10.0000 mL | INTRAVENOUS | Status: DC | PRN
  Administered 2018-04-15: 10 mL via INTRAVENOUS
  Filled 2018-04-15: qty 10

## 2018-04-15 MED ORDER — HEPARIN SOD (PORK) LOCK FLUSH 100 UNIT/ML IV SOLN
500.0000 [IU] | Freq: Once | INTRAVENOUS | Status: AC
  Administered 2018-04-15: 500 [IU] via INTRAVENOUS

## 2018-04-15 NOTE — Progress Notes (Signed)
Patient here for follow up. No concerns voiced.  °

## 2018-04-16 ENCOUNTER — Inpatient Hospital Stay: Payer: 59

## 2018-04-16 DIAGNOSIS — C50411 Malignant neoplasm of upper-outer quadrant of right female breast: Secondary | ICD-10-CM | POA: Diagnosis not present

## 2018-04-16 DIAGNOSIS — Z170 Estrogen receptor positive status [ER+]: Principal | ICD-10-CM

## 2018-04-16 MED ORDER — GOSERELIN ACETATE 3.6 MG ~~LOC~~ IMPL
3.6000 mg | DRUG_IMPLANT | Freq: Once | SUBCUTANEOUS | Status: AC
  Administered 2018-04-16: 3.6 mg via SUBCUTANEOUS
  Filled 2018-04-16: qty 3.6

## 2018-04-16 NOTE — Progress Notes (Signed)
Hematology/Oncology Follow up note Anne Wu [phone] [phone]   Patient Care Team: [name], NP as PCP - General (Nurse Practitioner)  REASON FOR VISIT Follow up for management of breast cancer   HISTORY OF PRESENTING ILLNESS:  Anne Wu is a  37 y.o.  female with diagnosis of cT2N0M0, grade 2 invasive mammary carcinoma.  She has significant family history on both maternal side as well as paternal side. She felt a right breast mass 2 months ago and diagnostic mammogram showed right upper quandrant 2.2 cm mass, which was confirmed on [place] and biopsied. Right axillary no clinically suspicious lymph node. Pathology showed invasive mammary carcinoma, ER/PR positive,, HER2 IHC  equivocal, FISH negative.   # Patient was evaluated by Dr.Smith. From surgical standpoint, Dr.Smith recommend neoadjuvant chemotherapy to facilitate surgery to obtain negative margin and better surgical outcome. Also given patient's young age, neoadjuvant chemotherapy is reasonable.    #Patient stated that she does not desire future fertility.  # 2D Echo showed normal systolic function, mild mitral regurgitation.  # CT was done  to rule out metastatic disease given micrometastasis. Images were independently reviewed by me and discussed with patient.  Negative for metastatic disease. # Testing did not reveal any pathogenic mutation in any of these genes. A copy of the genetic test report will be scanned into Epic under the Media tab.The genes analyzed were the 23 genes on [facility]'s Breast/GYN panel (ATM, BARD1, BRCA1, BRCA2, BRIP1, CDH1, CHEK2, DICER1, EPCAM, MLH1,  MSH2, MSH6, NBN, NF1, PALB2, PMS2, PTEN, RAD50, RAD51C, RAD51D,SMARCA4, STK11, and TP53). 09/08/2017 Interval mammogram after 4 cycle of AC, showed good treatment response. The biopsy-proven malignancy in the upper outer quadrant of the right breast at posterior depth, associated with scattered  microcalcifications and architectural distortion, has significantly decreased in size since the mammogram 06/20/2017. On today's mammogram, the mass measures approximately 1.7 x 2.2 x 1.2 cm (previously 2.4 x 1.6 x 2.7 cm).   Treatment Summary :  Jan 2019- May 2019 Neoadjuvant AC-->T  12/29/2017 Right lumpectomy and sentinel LN biopsy Pathology showed ypT2 ypN0 (i+) (sn) case was discussed on tumor board.ER/PR positive, HER 2 negative.  Aug 2019-September 2019 Adjuvant RT  Oct 2019 to start Zoladex and Aromasin.   INTERVAL HISTORY Anne Wu is a 37 y.o. female who has above history reviewed by me presented for follow-up breast cancer treatment. She has finished adjuvant radiation last week. Reports feeling well.  No new complaints.    Review of Systems  Constitutional: Negative for chills, fever, malaise/fatigue and weight loss.  HENT: Negative for congestion, ear discharge, ear pain, hearing loss, nosebleeds, sinus pain and sore throat.   Eyes: Negative for double vision, photophobia, pain, discharge and redness.  Respiratory: Negative for cough, hemoptysis, sputum production, shortness of breath and wheezing.   Cardiovascular: Negative for chest pain, palpitations, orthopnea, claudication and leg swelling.  Gastrointestinal: Negative for abdominal pain, blood in stool, constipation, diarrhea, heartburn, melena, nausea and vomiting.  Genitourinary: Negative for dysuria, flank pain, frequency, hematuria and urgency.  Musculoskeletal: Negative for back pain, myalgias and neck pain.  Skin: Negative for itching and rash.  Neurological: Negative for dizziness, tingling, tremors, sensory change, focal weakness, weakness and headaches.  Endo/Heme/Allergies: Negative for environmental allergies and polydipsia. Does not bruise/bleed easily.  Psychiatric/Behavioral: Negative for depression, hallucinations, substance abuse and suicidal ideas. The patient is not nervous/anxious.     MEDICAL  HISTORY:  Past Medical History:  Diagnosis Date  . Asthma  AS A CHILD-NO INHALERS  . Breast cancer ([place])   . Elevated blood pressure, situational    PT STATES HER LAST COUPLE OF MD APPOINTMENTS SHE HAS HAD ELEVATED BP-NEVER HAD A PROBLEM WITH THIS PREVIOUSLY  . Genetic testing 09/05/2017   Breast/GYN panel (23 genes) @ [facility] - No pathogenic mutations detected  . GERD (gastroesophageal reflux disease)    OCC- NO MEDS  . Personal history of chemotherapy     SURGICAL HISTORY: Past Surgical History:  Procedure Laterality Date  . BREAST BIOPSY Right 06/20/2017   Invasive Mammary Carcinoma  . BREAST EXCISIONAL BIOPSY Right 12/29/2017   lumpectomy with NL  . BREAST LUMPECTOMY Right 12/29/2017   needle localization and sentinel node injection  . NO PAST SURGERIES    . PARTIAL MASTECTOMY WITH NEEDLE LOCALIZATION Right 12/29/2017   Procedure: PARTIAL MASTECTOMY WITH NEEDLE LOCALIZATION;  Surgeon: [name], MD;  Location: [facility] ORS;  Service: General;  Laterality: Right;  . PORTACATH PLACEMENT N/A 07/11/2017   Procedure: INSERTION PORT-A-CATH;  Surgeon: Leonie Green, MD;  Location: [facility] ORS;  Service: General;  Laterality: N/A;  . SENTINEL NODE BIOPSY Right 12/29/2017   Procedure: SENTINEL NODE BIOPSY;  Surgeon: [name], MD;  Location: [facility] ORS;  Service: General;  Laterality: Right;    SOCIAL HISTORY: Social History   Socioeconomic History  . Marital status: Married    Spouse name: Not on file  . Number of children: Not on file  . Years of education: Not on file  . Highest education level: Not on file  Occupational History  . Not on file  Social Needs  . Financial resource strain: Not on file  . Food insecurity:    Worry: Not on file    Inability: Not on file  . Transportation needs:    Medical: Not on file    Non-medical: Not on file  Tobacco Use  . Smoking status: Never Smoker  . Smokeless tobacco: Never Used  Substance and Sexual  Activity  . Alcohol use: No    Alcohol/week: 0.0 standard drinks  . Drug use: No  . Sexual activity: Yes  Lifestyle  . Physical activity:    Days per week: Not on file    Minutes per session: Not on file  . Stress: Not on file  Relationships  . Social connections:    Talks on phone: Not on file    Gets together: Not on file    Attends religious service: Not on file    Active member of club or organization: Not on file    Attends meetings of clubs or organizations: Not on file    Relationship status: Not on file  . Intimate partner violence:    Fear of current or ex partner: Not on file    Emotionally abused: Not on file    Physically abused: Not on file    Forced sexual activity: Not on file  Other Topics Concern  . Not on file  Social History Narrative  . Not on file    FAMILY HISTORY: Family History  Problem Relation Age of Onset  . Breast cancer Maternal Aunt 63       currently late 42s  . Breast cancer Maternal Grandmother 68       second breast vs. recurrence ca at 59; deceased at 23  . Colon cancer Maternal Uncle 35       deceased in 26s  . Breast cancer Paternal Aunt        age  at dx unknown; currently in 38s  . Breast cancer Paternal Grandmother        age at dx unknown  . Melanoma Mother        on leg  . Testicular cancer Maternal Uncle 60       currently 4s    ALLERGIES:  is allergic to codeine.  MEDICATIONS:  Current Outpatient Medications  Medication Sig Dispense Refill  . acetaminophen (TYLENOL) 325 MG tablet Take 650 mg by mouth every 6 (six) hours as needed for moderate pain or headache.     . cholecalciferol (VITAMIN D) 1000 units tablet Take 1,000 Units by mouth daily.    Marland Kitchen loratadine (CLARITIN) 10 MG tablet Take 10 mg by mouth daily as needed for allergies.     . Multiple Vitamin (MULTI-VITAMINS) TABS Take 1 tablet by mouth daily.     . silver sulfADIAZINE (SILVADENE) 1 % cream Apply 1 application topically 2 (two) times daily. 50 g 2  .  lidocaine-prilocaine (EMLA) cream Apply to affected area once (Patient not taking: Reported on 03/02/2018) 30 g 3   No current facility-administered medications for this visit.    Facility-Administered Medications Ordered in Other Visits  Medication Dose Route Frequency Provider Last Rate Last Dose  . heparin lock flush 100 unit/mL  500 Units Intravenous Once [name], MD      . sodium chloride flush (NS) 0.9 % injection 10 mL  10 mL Intravenous PRN [name], MD   10 mL at 08/01/17 0829     PHYSICAL EXAMINATION: ECOG PERFORMANCE STATUS: 0 - Asymptomatic Vitals:   04/15/18 1420  BP: 136/87  Pulse: 96  Resp: 18  Temp: (!) 96.4 F (35.8 C)   Filed Weights   04/15/18 1420  Weight: 192 lb 9.6 oz (87.4 kg)    Physical Exam  Constitutional: She is oriented to person, place, and time and well-developed, well-nourished, and in no distress. No distress.  HENT:  Head: Normocephalic and atraumatic.  Right Ear: External ear normal.  Left Ear: External ear normal.  Nose: Nose normal.  Mouth/Throat: Oropharynx is clear and moist. No oropharyngeal exudate.  Eyes: Pupils are equal, round, and reactive to light. EOM are normal. Right eye exhibits no discharge. Left eye exhibits no discharge. No scleral icterus.  Neck: Normal range of motion. Neck supple. No JVD present.  Cardiovascular: Normal rate, regular rhythm, normal heart sounds and intact distal pulses. Exam reveals no friction rub.  No murmur heard. Pulmonary/Chest: Effort normal and breath sounds normal. No respiratory distress. She has no wheezes. She has no rales. She exhibits no tenderness.  Abdominal: Soft. Bowel sounds are normal. She exhibits no distension and no mass. There is no tenderness. There is no rebound and no guarding.  Musculoskeletal: Normal range of motion. She exhibits no edema, tenderness or deformity.  Lymphadenopathy:    She has no cervical adenopathy.  Neurological: She is alert and oriented to person, place,  and time. No cranial nerve deficit. She exhibits normal muscle tone. Coordination normal.  Skin: Skin is warm and dry. No rash noted. She is not diaphoretic. No erythema. No pallor.  Psychiatric: Memory, affect and judgment normal.    Lab Results  Component Value Date   WBC 5.7 04/15/2018   HGB 13.0 04/15/2018   HCT 38.2 04/15/2018   MCV 85.7 04/15/2018   PLT 260 04/15/2018   Recent Labs    11/28/17 0827 01/12/18 1421 04/15/18 1410  NA 135 135 138  K 4.2 3.9  3.8  CL 101 102 104  CO2 '24 23 26  ' GLUCOSE 94 94 112*  BUN '10 15 15  ' CREATININE 0.51 0.53 0.56  CALCIUM 9.3 8.9 9.3  GFRNONAA >60 >60 >60  GFRAA >60 >60 >60  PROT 7.1 7.1 7.2  ALBUMIN 3.8 3.9 4.1  AST '26 25 30  ' ALT '24 23 27  ' ALKPHOS 54 59 64  BILITOT 0.5 0.4 0.4    2D Echo was done which showed LVEF 55-65%, mild mitral valve regurgitation.  12/29/2017  Pathology Surgical Pathology  CASE: [phone]  DIAGNOSIS:  A. BREAST MASS, RIGHT; NEEDLE LOCALIZED LUMPECTOMY:  - RESIDUAL INVASIVE MAMMARY CARCINOMA, STATUS POST NEOADJUVANT CHEMOTHERAPY.  - DUCTAL CARCINOMA IN SITU.  - SEE CANCER SUMMARY BELOW.  - PRIOR BIOPSY SITE CHANGE WITH CLIP.   B. SENTINEL LYMPH NODE 1, RIGHT; EXCISION:  - ONE LYMPH NODE WITH FOCUS SUSPICIOUS FOR ISOLATED TUMOR CELLS.  - TWO LYMPH NODES NEGATIVE FOR MALIGNANCY.  - SEE COMMENT.   C. SENTINEL LYMPH NODE 2, RIGHT; EXCISION:  - ONE LYMPH NODE NEGATIVE FOR MALIGNANCY (0/1).   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  Margins:  Invasive Carcinoma Margins: Uninvolved by invasive  carcinoma  Distance from closest margin: 2 mm  Specify closest margin: Superior  DCIS Margins: Uninvolved by DCIS  Distance from closest margin: 3 mm  Specify closest margin: Superior  Regional Lymph Nodes: Involved by tumor cells       Number of Lymph Nodes with Macrometastases (>2 mm): 0       Number of Lymph Nodes with Micrometastases (>0.2 mm to 2 mm  or >200 cells): 0  Number of Lymph Nodes with Isolated Tumor Cells (=0.2 mm or =200 cells): 1  Size of Largest Metastatic Deposit: Less than 0.2 mm  Extranodal Extension: Not identified  Number of Lymph Nodes Examined: 4  Number of Sentinel Nodes Examined: 4  Treatment Effect:  Treatment Effect in the Breast: Definite response to presurgical therapy in the invasive carcinoma  Treatment Effect in the Lymph Nodes: Suspicious for isolated tumor cells, no prominent fibrous scarring in the nodes  Residual Cancer Burden (RCB) from MD [facility] calculator (website below):  Primary Tumor Bed  Primary tumor bed: 44 mm x 22 mm  Overall cancer cellularity: 10 %  Percentage of cancer that is in situ: 1 %  Lymph nodes  Number of lymph nodes positive for metastasis: 0  Residual Cancer Burden: 1.695  Residual Cancer Burden Class: RCB-II  [email or web address]  Lymphovascular Invasion: Present  Pathologic Stage Classification (pTNM, AJCC 8th Edition): ypT2 ypN0 (i+) (sn)  TNM Descriptors: y - neoadjuvant   BREAST BIOMARKER TESTS - performed on prior biopsy  Estrogen Receptor (ER) Status: POSITIVE, >90% nuclear staining  Progesterone Receptor (PgR) Status: POSITIVE, >90% nuclear staining  HER2 (by immunohistochemistry): EQUIVOCAL  HER2 (ERBB2) (by in situ hybridization): NEGATIVE  ASSESSMENT & PLAN:  1. Malignant neoplasm of upper-outer quadrant of right breast in female, estrogen receptor positive ([place])   2. Port-A-Cath in place   Cancer Staging Malignant neoplasm of upper-outer  quadrant of right breast in female, estrogen receptor positive ([place]) Staging form: Breast, AJCC  8th Edition - Clinical stage from 07/15/2017: Stage IB (cT2, cN0, cM0, G2, ER: Positive, PR: Positive, HER2: Negative) - Signed by [name], MD on 07/15/2017  #ypT2 ypN0 (i+) (sn), grade 2 breast cancer, ER/PR positive, HER 2 negative.  Status post right lumpectomy and sentinel lymph node biopsy Recommending starting adjuvant endocrine treatment with ovarian suppression using Zoladex plus aromatase inhibitor Aromasin. We discussed about the rationale of ovarian suppression and aromatase inhibitor combination further decreasing recurrence rate compared to tamoxifen.  Side effects including but not limiting to hot flush, mood swings, joint pain, osteoporosis vasomotor symptoms discussed with patient and she voices understanding and agreed to proceed with the plan. Advised patient to start Zoladex 3.6 mg injection monthly then start on Aromasin.  Prescription sent to pharmacy.  She will continue annual Mammogram which is due in December this year.   Port-A-Cath in place, discussed with patient and will keep Mediport for another 6 months and then discuss about whether to discontinue port or not.  Continue port flush every 8 weeks, to be scheduled.  We spent sufficient time to discuss many aspect of care, questions were answered to patient's satisfaction. The patient knows to call the clinic with any problems questions or concerns.  Return visit: 4 weeks.  Total face to face encounter time for this patient visit was 25 min. >50% of the time was  spent in counseling and coordination of care.    [name], MD, PhD Hematology Oncology [facility] at [facility] Pager- [phone] 04/16/2018

## 2018-04-29 ENCOUNTER — Encounter: Payer: Self-pay | Admitting: *Deleted

## 2018-04-29 ENCOUNTER — Other Ambulatory Visit: Payer: Self-pay | Admitting: Oncology

## 2018-04-29 MED ORDER — EXEMESTANE 25 MG PO TABS: 25.0000 mg | ORAL_TABLET | Freq: Every day | ORAL | 3 refills | Status: DC

## 2018-04-29 NOTE — Progress Notes (Signed)
aroma

## 2018-05-14 ENCOUNTER — Encounter: Payer: Self-pay | Admitting: *Deleted

## 2018-05-14 ENCOUNTER — Ambulatory Visit
Admission: RE | Admit: 2018-05-14 | Discharge: 2018-05-14 | Disposition: A | Discharge status: Home / Self Care | Payer: 59 | Source: Ambulatory Visit | Attending: Radiation Oncology | Admitting: Radiation Oncology

## 2018-05-14 ENCOUNTER — Inpatient Hospital Stay: Payer: 59

## 2018-05-14 ENCOUNTER — Inpatient Hospital Stay: Payer: 59 | Admitting: Oncology

## 2018-05-14 ENCOUNTER — Inpatient Hospital Stay (HOSPITAL_BASED_OUTPATIENT_CLINIC_OR_DEPARTMENT_OTHER): Payer: 59 | Admitting: Oncology

## 2018-05-14 ENCOUNTER — Other Ambulatory Visit: Payer: Self-pay

## 2018-05-14 ENCOUNTER — Encounter: Payer: Self-pay | Admitting: Oncology

## 2018-05-14 VITALS — BP 169/80 | HR 71 | Temp 96.2°F | Resp 18 | Wt 192.8 lb

## 2018-05-14 DIAGNOSIS — C50411 Malignant neoplasm of upper-outer quadrant of right female breast: Secondary | ICD-10-CM | POA: Diagnosis not present

## 2018-05-14 DIAGNOSIS — Z79811 Long term (current) use of aromatase inhibitors: Secondary | ICD-10-CM

## 2018-05-14 DIAGNOSIS — Z9221 Personal history of antineoplastic chemotherapy: Secondary | ICD-10-CM

## 2018-05-14 DIAGNOSIS — R232 Flushing: Secondary | ICD-10-CM

## 2018-05-14 DIAGNOSIS — Z170 Estrogen receptor positive status [ER+]: Secondary | ICD-10-CM | POA: Insufficient documentation

## 2018-05-14 DIAGNOSIS — Z95828 Presence of other vascular implants and grafts: Secondary | ICD-10-CM

## 2018-05-14 DIAGNOSIS — M2550 Pain in unspecified joint: Secondary | ICD-10-CM | POA: Diagnosis not present

## 2018-05-14 DIAGNOSIS — N951 Menopausal and female climacteric states: Secondary | ICD-10-CM | POA: Diagnosis not present

## 2018-05-14 DIAGNOSIS — T451X5A Adverse effect of antineoplastic and immunosuppressive drugs, initial encounter: Secondary | ICD-10-CM

## 2018-05-14 DIAGNOSIS — Z79899 Other long term (current) drug therapy: Secondary | ICD-10-CM

## 2018-05-14 LAB — COMPREHENSIVE METABOLIC PANEL
ALT: 16 U/L (ref 0–44)
AST: 20 U/L (ref 15–41)
Albumin: 4.1 g/dL (ref 3.5–5.0)
Alkaline Phosphatase: 63 U/L (ref 38–126)
Anion gap: 4 — ABNORMAL LOW (ref 5–15)
BUN: 14 mg/dL (ref 6–20)
CHLORIDE: 104 mmol/L (ref 98–111)
CO2: 30 mmol/L (ref 22–32)
CREATININE: 0.59 mg/dL (ref 0.44–1.00)
Calcium: 9.1 mg/dL (ref 8.9–10.3)
GFR calc Af Amer: >60 mL/min (ref >60)
Glucose, Bld: 100 mg/dL — ABNORMAL HIGH (ref 70–99)
Potassium: 4.4 mmol/L (ref 3.5–5.1)
Sodium: 138 mmol/L (ref 135–145)
Total Bilirubin: 0.4 mg/dL (ref 0.3–1.2)
Total Protein: 7.4 g/dL (ref 6.5–8.1)

## 2018-05-14 LAB — CBC WITH DIFFERENTIAL/PLATELET
Abs Immature Granulocytes: 0.03 10*3/uL (ref 0.00–0.07)
Basophils Absolute: 0 10*3/uL (ref 0.0–0.1)
Basophils Relative: 0 %
EOS PCT: 1 %
Eosinophils Absolute: 0.1 10*3/uL (ref 0.0–0.5)
HEMATOCRIT: 38.9 % (ref 36.0–46.0)
HEMOGLOBIN: 12.8 g/dL (ref 12.0–15.0)
Immature Granulocytes: 1 %
LYMPHS ABS: 1.1 10*3/uL (ref 0.7–4.0)
LYMPHS PCT: 19 %
MCH: 28.6 pg (ref 26.0–34.0)
MCHC: 32.9 g/dL (ref 30.0–36.0)
MCV: 87 fL (ref 80.0–100.0)
MONO ABS: 0.4 10*3/uL (ref 0.1–1.0)
Monocytes Relative: 8 %
Neutro Abs: 4.1 10*3/uL (ref 1.7–7.7)
Neutrophils Relative %: 71 %
Platelets: 281 10*3/uL (ref 150–400)
RBC: 4.47 MIL/uL (ref 3.87–5.11)
RDW: 11.4 % — ABNORMAL LOW (ref 11.5–15.5)
WBC: 5.8 10*3/uL (ref 4.0–10.5)
nRBC: 0 % (ref 0.0–0.2)

## 2018-05-14 MED ORDER — GOSERELIN ACETATE 3.6 MG ~~LOC~~ IMPL
3.6000 mg | DRUG_IMPLANT | Freq: Once | SUBCUTANEOUS | Status: AC
  Administered 2018-05-14: 3.6 mg via SUBCUTANEOUS
  Filled 2018-05-14: qty 3.6

## 2018-05-14 NOTE — Progress Notes (Signed)
Pain here for followup. No concerns voiced.

## 2018-05-14 NOTE — Progress Notes (Signed)
Radiation Oncology Follow up Note  Name: Anne Wu   Date:   05/14/2018 MRN:  [id number] DOB: [birth year]    This 37 y.o. female presents to the clinic today for one-month follow-up status post whole breast radiation.as well as peripheral lymphatic radiationfor a T2 N0 invasive mammary carcinoma.  REFERRING PROVIDER: [name], NP  HPI: patient is a 37 year old female now out 1 month.having completed neoadjuvant chemotherapy plus wide local excision and right whole breast and peripheral lymphatic radiation to her right breast for a T2 N2 0 grade 2 ER/PR positive invasive mammary carcinoma. Seen today in routine follow-up she is doing well. She specifically denies breast tenderness cough or bone pain.she is currently on Aromasin and has started Zoladex. She is tolerated both well. She specifically denies breast tenderness cough or bone pain.  COMPLICATIONS OF TREATMENT: none  FOLLOW UP COMPLIANCE: keeps appointments   PHYSICAL EXAM:  There were no vitals taken for this visit. Lungs are clear to A&P cardiac examination essentially unremarkable with regular rate and rhythm. No dominant mass or nodularity is noted in either breast in 2 positions examined. Incision is well-healed. No axillary or supraclavicular adenopathy is appreciated. Cosmetic result is excellent.Well-developed well-nourished patient in NAD. HEENT reveals PERLA, EOMI, discs not visualized.  Oral cavity is clear. No oral mucosal lesions are identified. Neck is clear without evidence of cervical or supraclavicular adenopathy. Lungs are clear to A&P. Cardiac examination is essentially unremarkable with regular rate and rhythm without murmur rub or thrill. Abdomen is benign with no organomegaly or masses noted. Motor sensory and DTR levels are equal and symmetric in the upper and lower extremities. Cranial nerves II through XII are grossly intact. Proprioception is intact. No peripheral adenopathy or edema is identified. No  motor or sensory levels are noted. Crude visual fields are within normal range.  RADIOLOGY RESULTS: no current films for review  PLAN: at the present time patient is doing well recovering nicely from her radiation therapy treatments. I am please ith her overall progress. Still some slight hyperpigmentation of the skin which will resolve. She has been started onAromasin is tolerated that well. I've asked to see around 4-5 months for follow-up. She knows to call with any concerns.  I would like to take this opportunity to thank you for allowing me to participate in the care of your patient.[name], MD

## 2018-05-16 NOTE — Progress Notes (Signed)
Hematology/Oncology Follow up note [name] [phone] [phone]   Patient Care Team: [name], NP as PCP - General (Nurse Practitioner)  REASON FOR VISIT Follow up for management of breast cancer   HISTORY OF PRESENTING ILLNESS:  Anne Wu is a  37 y.o.  female with diagnosis of cT2N0M0, grade 2 invasive mammary carcinoma.  She has significant family history on both maternal side as well as paternal side. She felt a right breast mass 2 months ago and diagnostic mammogram showed right upper quandrant 2.2 cm mass, which was confirmed on [place] and biopsied. Right axillary no clinically suspicious lymph node. Pathology showed invasive mammary carcinoma, ER/PR positive,, HER2 IHC  equivocal, FISH negative.   # Patient was evaluated by Dr.Smith. From surgical standpoint, Dr.Smith recommend neoadjuvant chemotherapy to facilitate surgery to obtain negative margin and better surgical outcome. Also given patient's young age, neoadjuvant chemotherapy is reasonable.    #Patient stated that she does not desire future fertility.  # 2D Echo showed normal systolic function, mild mitral regurgitation.  # CT was done  to rule out metastatic disease given micrometastasis. Images were independently reviewed by me and discussed with patient.  Negative for metastatic disease. # Testing did not reveal any pathogenic mutation in any of these genes. A copy of the genetic test report will be scanned into Epic under the Media tab.The genes analyzed were the 23 genes on [facility]'s Breast/GYN panel (ATM, BARD1, BRCA1, BRCA2, BRIP1, CDH1, CHEK2, DICER1, EPCAM, MLH1,  MSH2, MSH6, NBN, NF1, PALB2, PMS2, PTEN, RAD50, RAD51C, RAD51D,SMARCA4, STK11, and TP53). 09/08/2017 Interval mammogram after 4 cycle of AC, showed good treatment response. The biopsy-proven malignancy in the upper outer quadrant of the right breast at posterior depth, associated with scattered  microcalcifications and architectural distortion, has significantly decreased in size since the mammogram 06/20/2017. On today's mammogram, the mass measures approximately 1.7 x 2.2 x 1.2 cm (previously 2.4 x 1.6 x 2.7 cm).   Treatment Summary :  Jan 2019- May 2019 Neoadjuvant AC-->T  12/29/2017 Right lumpectomy and sentinel LN biopsy Pathology showed ypT2 ypN0 (i+) (sn) case was discussed on tumor board.ER/PR positive, HER 2 negative.  Aug 2019-September 2019 Adjuvant RT  Oct 2019 to start Zoladex and Aromasin.   INTERVAL HISTORY Anne Wu is a 37 y.o. female who has above history reviewed by me presents for follow up for breast cancer treatment.  S/p neoadjuvant AC-->T, right lumpectomy and sentinel LN biopsy, followed by adjuvant RT.  Currently on ovarian suppression and aromasin.  Reports having hot flashes, mild joint pain. Symptoms started after Zoladex and Aromasin.  Appetite is good.  No other new complaints.    Review of Systems  Constitutional: Negative for chills, fever, malaise/fatigue and weight loss.       Hot flash  HENT: Negative for congestion, ear discharge, ear pain, hearing loss, nosebleeds, sinus pain and sore throat.   Eyes: Negative for double vision, photophobia, pain, discharge and redness.  Respiratory: Negative for cough, hemoptysis, sputum production, shortness of breath and wheezing.   Cardiovascular: Negative for chest pain, palpitations, orthopnea, claudication and leg swelling.  Gastrointestinal: Negative for abdominal pain, blood in stool, constipation, diarrhea, heartburn, melena, nausea and vomiting.  Genitourinary: Negative for dysuria, flank pain, frequency, hematuria and urgency.  Musculoskeletal: Positive for joint pain. Negative for back pain, myalgias and neck pain.  Skin: Negative for itching and rash.  Neurological: Negative for dizziness, tingling, tremors, sensory change, focal weakness, weakness and headaches.  Endo/Heme/Allergies:  Negative for environmental allergies and polydipsia. Does not bruise/bleed easily.  Psychiatric/Behavioral: Negative for depression, hallucinations, substance abuse and suicidal ideas. The patient is not nervous/anxious.     MEDICAL HISTORY:  Past Medical History:  Diagnosis Date  . Asthma    AS A CHILD-NO INHALERS  . Breast cancer ([place])   . Elevated blood pressure, situational    PT STATES HER LAST COUPLE OF MD APPOINTMENTS SHE HAS HAD ELEVATED BP-NEVER HAD A PROBLEM WITH THIS PREVIOUSLY  . Genetic testing 09/05/2017   Breast/GYN panel (23 genes) @ [facility] - No pathogenic mutations detected  . GERD (gastroesophageal reflux disease)    OCC- NO MEDS  . Personal history of chemotherapy     SURGICAL HISTORY: Past Surgical History:  Procedure Laterality Date  . BREAST BIOPSY Right 06/20/2017   Invasive Mammary Carcinoma  . BREAST EXCISIONAL BIOPSY Right 12/29/2017   lumpectomy with NL  . BREAST LUMPECTOMY Right 12/29/2017   needle localization and sentinel node injection  . NO PAST SURGERIES    . PARTIAL MASTECTOMY WITH NEEDLE LOCALIZATION Right 12/29/2017   Procedure: PARTIAL MASTECTOMY WITH NEEDLE LOCALIZATION;  Surgeon: [name], MD;  Location: [facility] ORS;  Service: General;  Laterality: Right;  . PORTACATH PLACEMENT N/A 07/11/2017   Procedure: INSERTION PORT-A-CATH;  Surgeon: Leonie Green, MD;  Location: [facility] ORS;  Service: General;  Laterality: N/A;  . SENTINEL NODE BIOPSY Right 12/29/2017   Procedure: SENTINEL NODE BIOPSY;  Surgeon: [name], MD;  Location: [facility] ORS;  Service: General;  Laterality: Right;    SOCIAL HISTORY: Social History   Socioeconomic History  . Marital status: Married    Spouse name: Not on file  . Number of children: Not on file  . Years of education: Not on file  . Highest education level: Not on file  Occupational History  . Not on file  Social Needs  . Financial resource strain: Not on file  . Food insecurity:      Worry: Not on file    Inability: Not on file  . Transportation needs:    Medical: Not on file    Non-medical: Not on file  Tobacco Use  . Smoking status: Never Smoker  . Smokeless tobacco: Never Used  Substance and Sexual Activity  . Alcohol use: No    Alcohol/week: 0.0 standard drinks  . Drug use: No  . Sexual activity: Yes  Lifestyle  . Physical activity:    Days per week: Not on file    Minutes per session: Not on file  . Stress: Not on file  Relationships  . Social connections:    Talks on phone: Not on file    Gets together: Not on file    Attends religious service: Not on file    Active member of club or organization: Not on file    Attends meetings of clubs or organizations: Not on file    Relationship status: Not on file  . Intimate partner violence:    Fear of current or ex partner: Not on file    Emotionally abused: Not on file    Physically abused: Not on file    Forced sexual activity: Not on file  Other Topics Concern  . Not on file  Social History Narrative  . Not on file    FAMILY HISTORY: Family History  Problem Relation Age of Onset  . Breast cancer Maternal Aunt 75       currently late 28s  . Breast cancer Maternal Grandmother 24  second breast vs. recurrence ca at 41; deceased at 90  . Colon cancer Maternal Uncle 58       deceased in 62s  . Breast cancer Paternal Aunt        age at dx unknown; currently in 85s  . Breast cancer Paternal Grandmother        age at dx unknown  . Melanoma Mother        on leg  . Testicular cancer Maternal Uncle 67       currently 27s    ALLERGIES:  is allergic to codeine.  MEDICATIONS:  Current Outpatient Medications  Medication Sig Dispense Refill  . acetaminophen (TYLENOL) 325 MG tablet Take 650 mg by mouth every 6 (six) hours as needed for moderate pain or headache.     . cholecalciferol (VITAMIN D) 1000 units tablet Take 1,000 Units by mouth daily.    Marland Kitchen exemestane (AROMASIN) 25 MG tablet Take 1  tablet (25 mg total) by mouth daily after breakfast. 30 tablet 3  . lidocaine-prilocaine (EMLA) cream Apply to affected area once 30 g 3  . loratadine (CLARITIN) 10 MG tablet Take 10 mg by mouth daily as needed for allergies.     . Multiple Vitamin (MULTI-VITAMINS) TABS Take 1 tablet by mouth daily.     . silver sulfADIAZINE (SILVADENE) 1 % cream Apply 1 application topically 2 (two) times daily. (Patient not taking: Reported on 05/14/2018) 50 g 2   No current facility-administered medications for this visit.    Facility-Administered Medications Ordered in Other Visits  Medication Dose Route Frequency Provider Last Rate Last Dose  . heparin lock flush 100 unit/mL  500 Units Intravenous Once [name], MD      . sodium chloride flush (NS) 0.9 % injection 10 mL  10 mL Intravenous PRN [name], MD   10 mL at 08/01/17 0829     PHYSICAL EXAMINATION: ECOG PERFORMANCE STATUS: 0 - Asymptomatic Vitals:   05/14/18 1348 05/14/18 1443  BP: 125/90 (!) 169/80  Pulse: 98 71  Resp: 18   Temp: (!) 96.2 F (35.7 C)    Filed Weights   05/14/18 1348  Weight: 192 lb 12.8 oz (87.5 kg)    Physical Exam  Constitutional: She is oriented to person, place, and time. No distress.  HENT:  Head: Normocephalic and atraumatic.  Nose: Nose normal.  Mouth/Throat: Oropharynx is clear and moist. No oropharyngeal exudate.  Eyes: Pupils are equal, round, and reactive to light. EOM are normal. Right eye exhibits no discharge. Left eye exhibits no discharge. No scleral icterus.  Neck: Normal range of motion. Neck supple. No JVD present.  Cardiovascular: Normal rate, regular rhythm, normal heart sounds and intact distal pulses. Exam reveals no friction rub.  No murmur heard. Pulmonary/Chest: Effort normal and breath sounds normal. No respiratory distress. She has no wheezes. She has no rales. She exhibits no tenderness.  Abdominal: Soft. Bowel sounds are normal. She exhibits no distension and no mass. There is no  tenderness. There is no rebound and no guarding.  Musculoskeletal: Normal range of motion. She exhibits no edema, tenderness or deformity.  Lymphadenopathy:    She has no cervical adenopathy.  Neurological: She is alert and oriented to person, place, and time. No cranial nerve deficit. She exhibits normal muscle tone. Coordination normal.  Skin: Skin is warm and dry. No rash noted. She is not diaphoretic. No erythema. No pallor.  Psychiatric: Memory, affect and judgment normal.    Lab Results  Component  Value Date   WBC 5.8 05/14/2018   HGB 12.8 05/14/2018   HCT 38.9 05/14/2018   MCV 87.0 05/14/2018   PLT 281 05/14/2018   Recent Labs    01/12/18 1421 04/15/18 1410 05/14/18 1336  NA 135 138 138  K 3.9 3.8 4.4  CL 102 104 104  CO2 '23 26 30  ' GLUCOSE 94 112* 100*  BUN '15 15 14  ' CREATININE 0.53 0.56 0.59  CALCIUM 8.9 9.3 9.1  GFRNONAA >60 >60 >60  GFRAA >60 >60 >60  PROT 7.1 7.2 7.4  ALBUMIN 3.9 4.1 4.1  AST '25 30 20  ' ALT '23 27 16  ' ALKPHOS 59 64 63  BILITOT 0.4 0.4 0.4    2D Echo was done which showed LVEF 55-65%, mild mitral valve regurgitation.  12/29/2017  Pathology Surgical Pathology  CASE: [id number]  DIAGNOSIS:  A. BREAST MASS, RIGHT; NEEDLE LOCALIZED LUMPECTOMY:  - RESIDUAL INVASIVE MAMMARY CARCINOMA, STATUS POST NEOADJUVANT CHEMOTHERAPY.  - DUCTAL CARCINOMA IN SITU.  - SEE CANCER SUMMARY BELOW.  - PRIOR BIOPSY SITE CHANGE WITH CLIP.   B. SENTINEL LYMPH NODE 1, RIGHT; EXCISION:  - ONE LYMPH NODE WITH FOCUS SUSPICIOUS FOR ISOLATED TUMOR CELLS.  - TWO LYMPH NODES NEGATIVE FOR MALIGNANCY.  - SEE COMMENT.   C. SENTINEL LYMPH NODE 2, RIGHT; EXCISION:  - ONE LYMPH NODE NEGATIVE FOR MALIGNANCY (0/1).   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  Margins:  Invasive Carcinoma Margins: Uninvolved by invasive carcinoma  Distance from closest margin: 2 mm  Specify closest margin: Superior  DCIS Margins: Uninvolved by DCIS  Distance from closest margin: 3 mm  Specify closest margin: Superior  Regional Lymph Nodes: Involved by tumor cells       Number of Lymph Nodes with Macrometastases (>2 mm): 0       Number of Lymph Nodes with Micrometastases (>0.2 mm to 2 mm  or >200 cells): 0  Number of Lymph Nodes with Isolated Tumor Cells (=0.2 mm or =200 cells): 1  Size of Largest Metastatic Deposit: Less than 0.2 mm  Extranodal Extension: Not identified  Number of Lymph Nodes Examined: 4  Number of Sentinel Nodes Examined: 4  Treatment Effect:  Treatment Effect in the Breast: Definite response to presurgical therapy in the invasive carcinoma  Treatment Effect in the Lymph Nodes: Suspicious for isolated tumor cells, no prominent fibrous scarring in the nodes  Residual Cancer Burden (RCB) from MD [facility] calculator (website below):  Primary Tumor Bed  Primary tumor bed: 44 mm x 22 mm  Overall cancer cellularity: 10 %  Percentage of cancer that is in situ: 1 %  Lymph nodes  Number of lymph nodes positive for metastasis: 0  Residual Cancer Burden: 1.695  Residual Cancer Burden Class: RCB-II  [email or web address]  Lymphovascular Invasion: Present  Pathologic Stage Classification (pTNM, AJCC 8th Edition): ypT2 ypN0 (i+) (sn)  TNM Descriptors: y - neoadjuvant   BREAST BIOMARKER TESTS - performed on prior biopsy  Estrogen Receptor (ER) Status: POSITIVE, >90% nuclear staining  Progesterone Receptor (PgR) Status: POSITIVE, >90% nuclear staining  HER2 (by immunohistochemistry): EQUIVOCAL  HER2  (ERBB2) (by in situ  hybridization): NEGATIVE  ASSESSMENT & PLAN:  1. Malignant neoplasm of upper-outer quadrant of right breast in female, estrogen receptor positive ([place])   2. Port-A-Cath in place   3. Aromatase inhibitor-associated arthralgia   4. Aromatase inhibitor use   5. Hot flashes   Cancer Staging Malignant neoplasm of upper-outer quadrant of right breast in female, estrogen receptor positive ([place]) Staging form: Breast, AJCC 8th Edition - Clinical stage from 07/15/2017: Stage IB (cT2, cN0, cM0, G2, ER: Positive, PR: Positive, HER2: Negative) - Signed by [name], MD on 07/15/2017  #ypT2 ypN0 (i+) (sn), grade 2 breast cancer, ER/PR positive, HER 2 negative.  Labs reviewed and discussed with patient.  Tolerates ovarian suppression and Aromasin except vasomotor symptoms and arthralgia. She feels hot flashes manageable.  Mild joint pain, recommend Tylenol PRN.  Proceed with Zoladex 3.92m today.  Follow up for Zoladex injection in 4 weeks.   [place], recommend calcium and vitamin D supplements. Discussed with patient that plan to obtain DEXA next year.  Recommend exercise. Also will check lipid panel at next visit.   Check diagnostic mammogram in December.  Porta Cath in place, continue port flush every 8 weeks.  Orders Placed This Encounter  Procedures  . CBC with Differential/Platelet    Standing Status:   Future    Standing Expiration Date:   05/15/2019  . Comprehensive metabolic panel    Standing Status:   Future    Standing Expiration Date:   05/15/2019  . Lipid panel    Standing Status:   Future    Standing Expiration Date:   08/14/2018   We spent sufficient time to discuss many aspect of care, questions were answered to patient's satisfaction. The patient knows to call the clinic with any problems questions or concerns.  Return visit: 8 weeks. .[name] MD, PhD Hematology Oncology CE[facility] Centerat AWindhaven Surgery CenterPager-  [phone]/08/2017

## 2018-05-29 ENCOUNTER — Encounter: Payer: Self-pay | Admitting: Oncology

## 2018-06-03 ENCOUNTER — Encounter: Payer: Self-pay | Admitting: Oncology

## 2018-06-03 ENCOUNTER — Inpatient Hospital Stay: Payer: 59 | Attending: Oncology | Admitting: Oncology

## 2018-06-03 ENCOUNTER — Other Ambulatory Visit: Payer: Self-pay

## 2018-06-03 VITALS — BP 130/92 | HR 94 | Temp 97.9°F | Resp 18 | Wt 191.3 lb

## 2018-06-03 DIAGNOSIS — Z170 Estrogen receptor positive status [ER+]: Secondary | ICD-10-CM | POA: Diagnosis not present

## 2018-06-03 DIAGNOSIS — Z8043 Family history of malignant neoplasm of testis: Secondary | ICD-10-CM

## 2018-06-03 DIAGNOSIS — Z808 Family history of malignant neoplasm of other organs or systems: Secondary | ICD-10-CM | POA: Insufficient documentation

## 2018-06-03 DIAGNOSIS — C50411 Malignant neoplasm of upper-outer quadrant of right female breast: Secondary | ICD-10-CM | POA: Diagnosis not present

## 2018-06-03 DIAGNOSIS — Z79811 Long term (current) use of aromatase inhibitors: Secondary | ICD-10-CM | POA: Diagnosis not present

## 2018-06-03 DIAGNOSIS — R232 Flushing: Secondary | ICD-10-CM

## 2018-06-03 DIAGNOSIS — Z800 Family history of malignant neoplasm of digestive organs: Secondary | ICD-10-CM | POA: Insufficient documentation

## 2018-06-03 DIAGNOSIS — Z9221 Personal history of antineoplastic chemotherapy: Secondary | ICD-10-CM | POA: Diagnosis not present

## 2018-06-03 DIAGNOSIS — Z79899 Other long term (current) drug therapy: Secondary | ICD-10-CM | POA: Insufficient documentation

## 2018-06-03 DIAGNOSIS — Z803 Family history of malignant neoplasm of breast: Secondary | ICD-10-CM | POA: Insufficient documentation

## 2018-06-03 DIAGNOSIS — T451X5A Adverse effect of antineoplastic and immunosuppressive drugs, initial encounter: Secondary | ICD-10-CM

## 2018-06-03 DIAGNOSIS — M2550 Pain in unspecified joint: Secondary | ICD-10-CM

## 2018-06-03 DIAGNOSIS — N951 Menopausal and female climacteric states: Secondary | ICD-10-CM

## 2018-06-03 NOTE — Progress Notes (Signed)
Pt here because she is having dizziness, trouble focusing, confusion, tiredness, moodiness.  She is unsure if it is related to Aromasin. She stopped taking Aromasin this past Saturday 11/16.

## 2018-06-05 ENCOUNTER — Inpatient Hospital Stay: Payer: 59

## 2018-06-05 NOTE — Progress Notes (Unsigned)
Survivorship Care Plan visit completed.  Treatment summary reviewed and given to patient.  ASCO answers booklet reviewed and given to patient.  CARE program and Cancer Transitions discussed with patient along with other resources cancer center offers to patients and caregivers.  Patient verbalized understanding.    

## 2018-06-05 NOTE — Progress Notes (Signed)
Hematology/Oncology Follow up note [facility] [phone] [phone]   Patient Care Team: [name], NP as PCP - General (Nurse Practitioner) Theodore Demark, RN as Oncology Nurse Navigator [name], [name], MD as Referring Physician (Surgery) [name], MD as Consulting Physician (Oncology) [name], MD as Consulting Physician (General Surgery) [name], MD as Referring Physician (Radiation Oncology)  REASON FOR VISIT Follow up for management of breast cancer   HISTORY OF PRESENTING ILLNESS:  Anne Wu is a  37 y.o.  female with diagnosis of cT2N0M0, grade 2 invasive mammary carcinoma.  She has significant family history on both maternal side as well as paternal side. She felt a right breast mass 2 months ago and diagnostic mammogram showed right upper quandrant 2.2 cm mass, which was confirmed on [place] and biopsied. Right axillary no clinically suspicious lymph node. Pathology showed invasive mammary carcinoma, ER/PR positive,, HER2 IHC  equivocal, FISH negative.   # Patient was evaluated by Dr.Smith. From surgical standpoint, Dr.Smith recommend neoadjuvant chemotherapy to facilitate surgery to obtain negative margin and better surgical outcome. Also given patient's young age, neoadjuvant chemotherapy is reasonable.    #Patient stated that she does not desire future fertility.  # 2D Echo showed normal systolic function, mild mitral regurgitation.  # CT was done  to rule out metastatic disease given micrometastasis. Images were independently reviewed by me and discussed with patient.  Negative for metastatic disease. # Testing did not reveal any pathogenic mutation in any of these genes. A copy of the genetic test report will be scanned into Epic under the Media tab.The genes analyzed were the 23 genes on [facility]'s Breast/GYN panel (ATM, BARD1, BRCA1, BRCA2, BRIP1, CDH1, CHEK2, DICER1, EPCAM, MLH1,  MSH2, MSH6,  NBN, NF1, PALB2, PMS2, PTEN, RAD50, RAD51C, RAD51D,SMARCA4, STK11, and TP53). 09/08/2017 Interval mammogram after 4 cycle of AC, showed good treatment response. The biopsy-proven malignancy in the upper outer quadrant of the right breast at posterior depth, associated with scattered microcalcifications and architectural distortion, has significantly decreased in size since the mammogram 06/20/2017. On today's mammogram, the mass measures approximately 1.7 x 2.2 x 1.2 cm (previously 2.4 x 1.6 x 2.7 cm).   Treatment Summary :  Jan 2019- May 2019 Neoadjuvant AC-->T  12/29/2017 Right lumpectomy and sentinel LN biopsy Pathology showed ypT2 ypN0 (i+) (sn) case was discussed on tumor board.ER/PR positive, HER 2 negative.  Aug 2019-September 2019 Adjuvant RT  Oct 2019 to start Zoladex and Aromasin.   INTERVAL HISTORY Anne Wu is a 37 y.o. female who has above history reviewed by me presents for follow up.  S/p neoadjuvant AC-->T, right lumpectomy and sentinel LN biopsy, followed by adjuvant RT.  She takes Ovarian suppression and aromasin.  Report that right breast developed dimpling and she is very concerned requests to be seen. Also reports one episode of dizziness, trouble focusing, confusion. Also feels very moody.  Has hold aromasin for a few days.  manageable hot flashes.      Review of Systems  Constitutional: Negative for appetite change, chills, fatigue and fever.  HENT:   Negative for hearing loss and voice change.   Eyes: Negative for eye problems.  Respiratory: Negative for chest tightness and cough.   Cardiovascular: Negative for chest pain.  Gastrointestinal: Negative for abdominal distention, abdominal pain and blood in stool.  Endocrine: Positive for hot flashes.  Genitourinary: Negative for difficulty urinating and frequency.   Musculoskeletal: Negative for arthralgias.  Skin: Negative for itching and rash.  Neurological: Positive for dizziness and light-headedness.  Negative for extremity weakness.  Hematological: Negative for adenopathy.  Psychiatric/Behavioral: The patient is not nervous/anxious.     MEDICAL HISTORY:  Past Medical History:  Diagnosis Date  . Asthma    AS A CHILD-NO INHALERS  . Breast cancer ([place])   . Elevated blood pressure, situational    PT STATES HER LAST COUPLE OF MD APPOINTMENTS SHE HAS HAD ELEVATED BP-NEVER HAD A PROBLEM WITH THIS PREVIOUSLY  . Genetic testing 09/05/2017   Breast/GYN panel (23 genes) @ [facility] - No pathogenic mutations detected  . GERD (gastroesophageal reflux disease)    OCC- NO MEDS  . Personal history of chemotherapy     SURGICAL HISTORY: Past Surgical History:  Procedure Laterality Date  . BREAST BIOPSY Right 06/20/2017   Invasive Mammary Carcinoma  . BREAST EXCISIONAL BIOPSY Right 12/29/2017   lumpectomy with NL  . BREAST LUMPECTOMY Right 12/29/2017   needle localization and sentinel node injection  . NO PAST SURGERIES    . PARTIAL MASTECTOMY WITH NEEDLE LOCALIZATION Right 12/29/2017   Procedure: PARTIAL MASTECTOMY WITH NEEDLE LOCALIZATION;  Surgeon: [name], MD;  Location: [facility] ORS;  Service: General;  Laterality: Right;  . PORTACATH PLACEMENT N/A 07/11/2017   Procedure: INSERTION PORT-A-CATH;  Surgeon: Leonie Green, MD;  Location: [facility] ORS;  Service: General;  Laterality: N/A;  . SENTINEL NODE BIOPSY Right 12/29/2017   Procedure: SENTINEL NODE BIOPSY;  Surgeon: [name], MD;  Location: [facility] ORS;  Service: General;  Laterality: Right;    SOCIAL HISTORY: Social History   Socioeconomic History  . Marital status: Married    Spouse name: Not on file  . Number of children: Not on file  . Years of education: Not on file  . Highest education level: Not on file  Occupational History  . Not on file  Social Needs  . Financial resource strain: Not on file  . Food insecurity:    Worry: Not on file    Inability: Not on file  . Transportation needs:     Medical: Not on file    Non-medical: Not on file  Tobacco Use  . Smoking status: Never Smoker  . Smokeless tobacco: Never Used  Substance and Sexual Activity  . Alcohol use: No    Alcohol/week: 0.0 standard drinks  . Drug use: No  . Sexual activity: Yes  Lifestyle  . Physical activity:    Days per week: Not on file    Minutes per session: Not on file  . Stress: Not on file  Relationships  . Social connections:    Talks on phone: Not on file    Gets together: Not on file    Attends religious service: Not on file    Active member of club or organization: Not on file    Attends meetings of clubs or organizations: Not on file    Relationship status: Not on file  . Intimate partner violence:    Fear of current or ex partner: Not on file    Emotionally abused: Not on file    Physically abused: Not on file    Forced sexual activity: Not on file  Other Topics Concern  . Not on file  Social History Narrative  . Not on file    FAMILY HISTORY: Family History  Problem Relation Age of Onset  . Breast cancer Maternal Aunt 110       currently late 45s  . Breast cancer Maternal Grandmother 65  second breast vs. recurrence ca at 76; deceased at 69  . Colon cancer Maternal Uncle 6       deceased in 68s  . Breast cancer Paternal Aunt        age at dx unknown; currently in 9s  . Breast cancer Paternal Grandmother        age at dx unknown  . Melanoma Mother        on leg  . Testicular cancer Maternal Uncle 71       currently 43s    ALLERGIES:  is allergic to codeine.  MEDICATIONS:  Current Outpatient Medications  Medication Sig Dispense Refill  . acetaminophen (TYLENOL) 325 MG tablet Take 650 mg by mouth every 6 (six) hours as needed for moderate pain or headache.     . cholecalciferol (VITAMIN D) 1000 units tablet Take 1,000 Units by mouth daily.    Marland Kitchen lidocaine-prilocaine (EMLA) cream Apply to affected area once 30 g 3  . loratadine (CLARITIN) 10 MG tablet Take 10 mg by  mouth daily as needed for allergies.     . Multiple Vitamin (MULTI-VITAMINS) TABS Take 1 tablet by mouth daily.     Marland Kitchen exemestane (AROMASIN) 25 MG tablet Take 1 tablet (25 mg total) by mouth daily after breakfast. (Patient not taking: Reported on 06/03/2018) 30 tablet 3  . silver sulfADIAZINE (SILVADENE) 1 % cream Apply 1 application topically 2 (two) times daily. (Patient not taking: Reported on 05/14/2018) 50 g 2   No current facility-administered medications for this visit.    Facility-Administered Medications Ordered in Other Visits  Medication Dose Route Frequency Provider Last Rate Last Dose  . heparin lock flush 100 unit/mL  500 Units Intravenous Once [name], MD      . sodium chloride flush (NS) 0.9 % injection 10 mL  10 mL Intravenous PRN [name], MD   10 mL at 08/01/17 0829     PHYSICAL EXAMINATION: ECOG PERFORMANCE STATUS: 1 - Symptomatic but completely ambulatory Vitals:   06/03/18 1431  BP: (!) 130/92  Pulse: 94  Resp: 18  Temp: 97.9 F (36.6 C)   Filed Weights   06/03/18 1431  Weight: 191 lb 4.8 oz (86.8 kg)    Physical Exam  Constitutional: She is oriented to person, place, and time. No distress.  HENT:  Head: Normocephalic and atraumatic.  Nose: Nose normal.  Mouth/Throat: Oropharynx is clear and moist. No oropharyngeal exudate.  Eyes: Pupils are equal, round, and reactive to light. EOM are normal. Right eye exhibits no discharge. Left eye exhibits no discharge. No scleral icterus.  Neck: Normal range of motion. Neck supple. No JVD present.  Cardiovascular: Normal rate, regular rhythm, normal heart sounds and intact distal pulses. Exam reveals no friction rub.  No murmur heard. Pulmonary/Chest: Effort normal and breath sounds normal. No respiratory distress. She has no wheezes. She has no rales. She exhibits no tenderness.  Abdominal: Soft. Bowel sounds are normal. She exhibits no distension and no mass. There is no tenderness. There is no rebound and no  guarding.  Musculoskeletal: Normal range of motion. She exhibits no edema, tenderness or deformity.  Lymphadenopathy:    She has no cervical adenopathy.  Neurological: She is alert and oriented to person, place, and time. No cranial nerve deficit. She exhibits normal muscle tone. Coordination normal.  Skin: Skin is warm and dry. No rash noted. She is not diaphoretic. No erythema. No pallor.  Psychiatric: Memory, affect and judgment normal.    Lab Results  Component Value Date   WBC 5.8 05/14/2018   HGB 12.8 05/14/2018   HCT 38.9 05/14/2018   MCV 87.0 05/14/2018   PLT 281 05/14/2018   Recent Labs    01/12/18 1421 04/15/18 1410 05/14/18 1336  NA 135 138 138  K 3.9 3.8 4.4  CL 102 104 104  CO2 '23 26 30  ' GLUCOSE 94 112* 100*  BUN '15 15 14  ' CREATININE 0.53 0.56 0.59  CALCIUM 8.9 9.3 9.1  GFRNONAA >60 >60 >60  GFRAA >60 >60 >60  PROT 7.1 7.2 7.4  ALBUMIN 3.9 4.1 4.1  AST '25 30 20  ' ALT '23 27 16  ' ALKPHOS 59 64 63  BILITOT 0.4 0.4 0.4    2D Echo was done which showed LVEF 55-65%, mild mitral valve regurgitation.  12/29/2017  Pathology Surgical Pathology  CASE: [id number]  DIAGNOSIS:  A. BREAST MASS, RIGHT; NEEDLE LOCALIZED LUMPECTOMY:  - RESIDUAL INVASIVE MAMMARY CARCINOMA, STATUS POST NEOADJUVANT CHEMOTHERAPY.  - DUCTAL CARCINOMA IN SITU.  - SEE CANCER SUMMARY BELOW.  - PRIOR BIOPSY SITE CHANGE WITH CLIP.   B. SENTINEL LYMPH NODE 1, RIGHT; EXCISION:  - ONE LYMPH NODE WITH FOCUS SUSPICIOUS FOR ISOLATED TUMOR CELLS.  - TWO LYMPH NODES NEGATIVE FOR MALIGNANCY.  - SEE COMMENT.   C. SENTINEL LYMPH NODE 2, RIGHT; EXCISION:  - ONE LYMPH NODE NEGATIVE FOR MALIGNANCY (0/1).   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  Margins:  Invasive Carcinoma Margins: Uninvolved by invasive carcinoma  Distance from closest margin: 2 mm  Specify closest margin: Superior  DCIS Margins: Uninvolved by DCIS  Distance from closest margin: 3 mm  Specify closest margin: Superior  Regional Lymph Nodes: Involved by tumor cells       Number of Lymph Nodes with Macrometastases (>2 mm): 0       Number of Lymph Nodes with Micrometastases (>0.2 mm to 2 mm  or >200 cells): 0  Number of Lymph Nodes with Isolated Tumor Cells (=0.2 mm or =200 cells): 1  Size of Largest Metastatic Deposit: Less than 0.2 mm  Extranodal Extension: Not identified  Number of Lymph Nodes Examined: 4  Number of Sentinel Nodes Examined: 4  Treatment Effect:  Treatment Effect in the Breast: Definite response to presurgical therapy in the invasive carcinoma  Treatment Effect in the Lymph Nodes: Suspicious for isolated tumor cells, no prominent fibrous scarring in the nodes  Residual Cancer Burden (RCB) from MD [facility] calculator (website below):  Primary Tumor Bed  Primary tumor bed: 44 mm x 22 mm  Overall cancer cellularity: 10 %  Percentage of cancer that is in situ: 1 %  Lymph nodes  Number of lymph nodes positive for metastasis: 0  Residual Cancer Burden: 1.695  Residual Cancer Burden Class: RCB-II  [email or web address]  Lymphovascular Invasion: Present  Pathologic Stage Classification (pTNM, AJCC 8th Edition): ypT2 ypN0 (i+) (sn)  TNM Descriptors: y - neoadjuvant   BREAST BIOMARKER TESTS - performed on prior biopsy  Estrogen Receptor (ER) Status: POSITIVE, >90% nuclear staining  Progesterone Receptor (PgR) Status: POSITIVE, >90% nuclear staining  HER2 (by immunohistochemistry): EQUIVOCAL  HER2 (ERBB2) (by in situ  hybridization): NEGATIVE  ASSESSMENT & PLAN:  1. Malignant neoplasm of upper-outer quadrant of right breast in female, estrogen receptor positive ([place])   2. Aromatase inhibitor-associated arthralgia   3. Aromatase inhibitor use   4. Hot flashes   5. Vasomotor symptoms due to menopause   Cancer Staging Malignant neoplasm of upper-outer quadrant of right breast in female, estrogen receptor positive ([place]) Staging form: Breast, AJCC 8th Edition - Clinical stage from 07/15/2017: Stage IB (cT2, cN0, cM0, G2, ER: Positive, PR: Positive, HER2: Negative) - Signed by [name], MD on 07/15/2017 #ypT2 ypN0 (i+) (sn), grade 2 breast cancer, ER/PR positive, HER 2 negative.  Discussed with patient that her dizziness, fatigue, hot flashes, are likely vasomotor symptoms due to menopause.  She can continue zoladex and hold aromasin for 1-2 weeks and restart.  She was made aware that if symptoms get worse, antidepressant like effextor can be considered.   joint pain, she can continue Tylenol PRN.   Proceed with Zoladex injection in 2 weeks.   Breast dimpling likely due to skin changes from radiation. Continue to monitor. Reassurance provided. diagnostic mammogram in December.  Porta Cath in place, continue port flush every 8 weeks.  We spent sufficient time to discuss many aspect of care, questions were answered to patient's satisfaction. The patient knows to call the clinic with any problems questions or concerns.  Return visit: keep her original appointment.   Total face to face encounter time for this patient visit was 25 min. >50% of the time was  spent in counseling and coordination of care.   [name], MD, PhD Hematology Oncology [facility] Pager- [phone] 06/05/2018

## 2018-06-10 ENCOUNTER — Ambulatory Visit: Payer: 59 | Admitting: Oncology

## 2018-06-10 ENCOUNTER — Inpatient Hospital Stay: Payer: 59

## 2018-06-15 ENCOUNTER — Telehealth: Payer: Self-pay | Admitting: *Deleted

## 2018-06-15 NOTE — Telephone Encounter (Signed)
Just notify me when she is here for zoledex. I will take a look.

## 2018-06-15 NOTE — Telephone Encounter (Signed)
Patient called reporting that her surgical arm is slightly swollen and would like a nurse to look at her when comes in tomorrow for port flush and injection. Please advise

## 2018-06-16 ENCOUNTER — Inpatient Hospital Stay: Payer: 59 | Attending: Oncology

## 2018-06-16 ENCOUNTER — Inpatient Hospital Stay (HOSPITAL_BASED_OUTPATIENT_CLINIC_OR_DEPARTMENT_OTHER): Payer: 59 | Admitting: Nurse Practitioner

## 2018-06-16 ENCOUNTER — Inpatient Hospital Stay: Payer: 59

## 2018-06-16 ENCOUNTER — Encounter: Payer: Self-pay | Admitting: Nurse Practitioner

## 2018-06-16 DIAGNOSIS — Z170 Estrogen receptor positive status [ER+]: Secondary | ICD-10-CM | POA: Diagnosis not present

## 2018-06-16 DIAGNOSIS — Z5111 Encounter for antineoplastic chemotherapy: Secondary | ICD-10-CM | POA: Insufficient documentation

## 2018-06-16 DIAGNOSIS — C50411 Malignant neoplasm of upper-outer quadrant of right female breast: Secondary | ICD-10-CM

## 2018-06-16 DIAGNOSIS — L259 Unspecified contact dermatitis, unspecified cause: Secondary | ICD-10-CM

## 2018-06-16 DIAGNOSIS — N951 Menopausal and female climacteric states: Secondary | ICD-10-CM | POA: Insufficient documentation

## 2018-06-16 DIAGNOSIS — Z79811 Long term (current) use of aromatase inhibitors: Secondary | ICD-10-CM | POA: Insufficient documentation

## 2018-06-16 DIAGNOSIS — Z95828 Presence of other vascular implants and grafts: Secondary | ICD-10-CM

## 2018-06-16 MED ORDER — GOSERELIN ACETATE 3.6 MG ~~LOC~~ IMPL
3.6000 mg | DRUG_IMPLANT | Freq: Once | SUBCUTANEOUS | Status: AC
  Administered 2018-06-16: 3.6 mg via SUBCUTANEOUS
  Filled 2018-06-16: qty 3.6

## 2018-06-16 MED ORDER — SODIUM CHLORIDE 0.9% FLUSH
10.0000 mL | Freq: Once | INTRAVENOUS | Status: AC
  Administered 2018-06-16: 10 mL via INTRAVENOUS
  Filled 2018-06-16: qty 10

## 2018-06-16 MED ORDER — HEPARIN SOD (PORK) LOCK FLUSH 100 UNIT/ML IV SOLN
500.0000 [IU] | Freq: Once | INTRAVENOUS | Status: AC
  Administered 2018-06-16: 500 [IU] via INTRAVENOUS
  Filled 2018-06-16: qty 5

## 2018-06-16 NOTE — Progress Notes (Signed)
Symptom Management [place]  [phone] [phone]  Patient Care Team: [name], NP as PCP - General (Nurse Practitioner) Theodore Demark, RN as Oncology Nurse Navigator [name], [name], MD as Referring Physician (Surgery) [name], MD as Consulting Physician (Oncology) [name], MD as Consulting Physician (General Surgery) [name], MD as Referring Physician (Radiation Oncology)   Name of the patient: Anne Wu  [id number]  [birth year]   Date of visit: 06/16/18  Diagnosis-breast cancer  Chief complaint/ Reason for visit- arm pain & swelling  Heme/Onc history:  [place] 37 year old female with diagnosis of CT 2 N0 M0, grade 2 invasive mammary carcinoma.  Family history significant on both maternal and paternal side for breast cancer.  She palpated right breast mass.  Diagnostic mammogram showed right upper quadrant 2.2 cm mass, confirmed on ultrasound and biopsy.  No suspicious lymph nodes in the right axilla.  Pathology showed invasive mammary carcinoma, ER/PR positive, HER-2 IHC equivocal, FISH negative.  Patient was evaluated by Dr. [name] who recommended neoadjuvant chemotherapy to facilitate surgery to obtain negative margin and better surgical outcome.  Patient did not desire future fertility.    2D echo showed LVEF 06-26% (normal systolic function) and mild mitral regurgitation.  CT negative for metastatic disease.  Genetic testing- [facility] 23 gene Breast/Gyn panel negative for pathogenic mutation  07/2017- May 2019-neoadjuvant Adriamycin plus Cytoxan followed by Taxol 12/29/2017-right lumpectomy and sentinel lymph node biopsy.  Pathology showed: residual invasive mammary carcinoma status post neoadjuvant chemotherapy, ductal carcinoma in situ, tumor size 21 mm, grade 2, negative margins B.  Sentinel lymph node 1, right: Excision: -1 lymph node with focus suspicious for isolated tumor  cells -2 lymph nodes negative for malignancy C.  Sentinel lymph node 2, right: Excision: -1 lymph node negative for malignancy Pathologic stage: ypT2 ypN0 (i+)(sn) ER positive, PR positive, HER-2 by IHC equivocal, HER-2 (ERBB2) by FISH negative.   Case was discussed at tumor board with recommendation for adjuvant RT.   02/2018-03/2018-adjuvant RT 04/2018- Started Zoladex and Aromasin  Patient last seen by Dr. Tasia Catchings, primary medical oncologist on 06/03/2018 for skin changes to right breast thought to be associated with radiation.  She also complained of hot flashes to be due to possible symptoms.  Zoladex and Aromasin held for 1-2 weeks and recommendation for possible Effexor in future for persistent symptoms.  Diagnostic mammogram scheduled for December 2019.  Scheduled for Zoladex on 06/16/2018.    Malignant neoplasm of upper-outer quadrant of right breast in female, estrogen receptor positive ([place])    Interval history- Anne Wu, 37 year old female, with above history of breast cancer, presents to symptom management clinic for irritation, swelling, and itching of right arm localized around right elbow. Symptoms started 1 day ago and have gradually improved since that time.  Today, she denies pain or swelling but believes area is 'still red'. She hasn't had similar symptoms previously and didn't use anything on the area. Nothing seemed to make symptoms worse.  She denies any fever or chills.  Denies past history of lymphedema.  Denies any neurologic complaints. Denies recent fevers or illnesses. Denies any easy bleeding or bruising. Reports good appetite and denies weight loss. Denies chest pain. Denies any nausea, vomiting, constipation, or diarrhea. Denies urinary complaints. Patient offers no further specific complaints today.  ECOG FS:0 - Asymptomatic  Review of systems- Review of Systems  Constitutional: Negative.   HENT: Negative.   Respiratory: Negative.   Cardiovascular: Negative.  Musculoskeletal: Negative.   Skin: Positive for itching and rash.  Neurological: Negative.   Psychiatric/Behavioral: Negative.      Current treatment- Zoladex & Aromasin  Allergies  Allergen Reactions  . Codeine Other (See Comments)    Hands peeled    Past Medical History:  Diagnosis Date  . Asthma    AS A CHILD-NO INHALERS  . Breast cancer ([place])   . Elevated blood pressure, situational    PT STATES HER LAST COUPLE OF MD APPOINTMENTS SHE HAS HAD ELEVATED BP-NEVER HAD A PROBLEM WITH THIS PREVIOUSLY  . Genetic testing 09/05/2017   Breast/GYN panel (23 genes) @ [facility] - No pathogenic mutations detected  . GERD (gastroesophageal reflux disease)    OCC- NO MEDS  . Personal history of chemotherapy     Past Surgical History:  Procedure Laterality Date  . BREAST BIOPSY Right 06/20/2017   Invasive Mammary Carcinoma  . BREAST EXCISIONAL BIOPSY Right 12/29/2017   lumpectomy with NL  . BREAST LUMPECTOMY Right 12/29/2017   needle localization and sentinel node injection  . NO PAST SURGERIES    . PARTIAL MASTECTOMY WITH NEEDLE LOCALIZATION Right 12/29/2017   Procedure: PARTIAL MASTECTOMY WITH NEEDLE LOCALIZATION;  Surgeon: [name], MD;  Location: [facility] ORS;  Service: General;  Laterality: Right;  . PORTACATH PLACEMENT N/A 07/11/2017   Procedure: INSERTION PORT-A-CATH;  Surgeon: Leonie Green, MD;  Location: [facility] ORS;  Service: General;  Laterality: N/A;  . SENTINEL NODE BIOPSY Right 12/29/2017   Procedure: SENTINEL NODE BIOPSY;  Surgeon: [name], MD;  Location: [facility] ORS;  Service: General;  Laterality: Right;    Social History   Socioeconomic History  . Marital status: Married    Spouse name: Not on file  . Number of children: Not on file  . Years of education: Not on file  . Highest education level: Not on file  Occupational History  . Not on file  Social Needs  . Financial resource strain: Not on file  . Food insecurity:    Worry: Not  on file    Inability: Not on file  . Transportation needs:    Medical: Not on file    Non-medical: Not on file  Tobacco Use  . Smoking status: Never Smoker  . Smokeless tobacco: Never Used  Substance and Sexual Activity  . Alcohol use: No    Alcohol/week: 0.0 standard drinks  . Drug use: No  . Sexual activity: Yes  Lifestyle  . Physical activity:    Days per week: Not on file    Minutes per session: Not on file  . Stress: Not on file  Relationships  . Social connections:    Talks on phone: Not on file    Gets together: Not on file    Attends religious service: Not on file    Active member of club or organization: Not on file    Attends meetings of clubs or organizations: Not on file    Relationship status: Not on file  . Intimate partner violence:    Fear of current or ex partner: Not on file    Emotionally abused: Not on file    Physically abused: Not on file    Forced sexual activity: Not on file  Other Topics Concern  . Not on file  Social History Narrative  . Not on file    Family History  Problem Relation Age of Onset  . Breast cancer Maternal Aunt 61       currently late 5s  .  Breast cancer Maternal Grandmother 68       second breast vs. recurrence ca at 74; deceased at 18  . Colon cancer Maternal Uncle 40       deceased in 31s  . Breast cancer Paternal Aunt        age at dx unknown; currently in 62s  . Breast cancer Paternal Grandmother        age at dx unknown  . Melanoma Mother        on leg  . Testicular cancer Maternal Uncle 84       currently 60s    Current Outpatient Medications:  .  acetaminophen (TYLENOL) 325 MG tablet, Take 650 mg by mouth every 6 (six) hours as needed for moderate pain or headache. , Disp: , Rfl:  .  cholecalciferol (VITAMIN D) 1000 units tablet, Take 1,000 Units by mouth daily., Disp: , Rfl:  .  exemestane (AROMASIN) 25 MG tablet, Take 1 tablet (25 mg total) by mouth daily after breakfast. (Patient not taking: Reported on  06/03/2018), Disp: 30 tablet, Rfl: 3 .  lidocaine-prilocaine (EMLA) cream, Apply to affected area once, Disp: 30 g, Rfl: 3 .  loratadine (CLARITIN) 10 MG tablet, Take 10 mg by mouth daily as needed for allergies. , Disp: , Rfl:  .  Multiple Vitamin (MULTI-VITAMINS) TABS, Take 1 tablet by mouth daily. , Disp: , Rfl:  .  silver sulfADIAZINE (SILVADENE) 1 % cream, Apply 1 application topically 2 (two) times daily. (Patient not taking: Reported on 05/14/2018), Disp: 50 g, Rfl: 2 No current facility-administered medications for this visit.   Facility-Administered Medications Ordered in Other Visits:  .  goserelin (ZOLADEX) injection 3.6 mg, 3.6 mg, Subcutaneous, Once, [name], MD .  heparin lock flush 100 unit/mL, 500 Units, Intravenous, Once, [name], MD .  sodium chloride flush (NS) 0.9 % injection 10 mL, 10 mL, Intravenous, PRN, [name], MD, 10 mL at 08/01/17 9244  Physical exam: There were no vitals filed for this visit. Physical Exam  Constitutional: She appears well-developed and well-nourished.  Seen in infusion suite; accompanied by mother  HENT:  Head: Normocephalic and atraumatic.  Eyes: Conjunctivae are normal. No scleral icterus.  Neck: Normal range of motion. Neck supple.  Pulmonary/Chest: Effort normal. No respiratory distress. She has no wheezes.  Skin: Skin is warm and dry. Rash noted.  Right arm: mild diffuse redness at elbow ~ 1 cm. Very Slight warmth & firmness. No swelling or obvious lesion. No drainage or fluctuant head identified.  Upper extremities symmetrical.   Psychiatric: She has a normal mood and affect.     CMP Latest Ref Rng & Units 05/14/2018  Glucose 70 - 99 mg/dL 100(H)  BUN 6 - 20 mg/dL 14  Creatinine 0.44 - 1.00 mg/dL 0.59  Sodium 135 - 145 mmol/L 138  Potassium 3.5 - 5.1 mmol/L 4.4  Chloride 98 - 111 mmol/L 104  CO2 22 - 32 mmol/L 30  Calcium 8.9 - 10.3 mg/dL 9.1  Total Protein 6.5 - 8.1 g/dL 7.4  Total Bilirubin 0.3 - 1.2 mg/dL 0.4  Alkaline  Phos 38 - 126 U/L 63  AST 15 - 41 U/L 20  ALT 0 - 44 U/L 16   CBC Latest Ref Rng & Units 05/14/2018  WBC 4.0 - 10.5 K/uL 5.8  Hemoglobin 12.0 - 15.0 g/dL 12.8  Hematocrit 36.0 - 46.0 % 38.9  Platelets 150 - 400 K/uL 281    No images are attached to the encounter.  No  results found.  Assessment and plan- Patient is a 37 y.o. female diagnosed with breast cancer who presents to symptom management clinic for contact dermatitis.   1.  Malignant neoplasm of upper outer quadrant of right breast-ER/PR positive, HER-2 negative.  Clinical stage Ib s/p 4 cycles of neoadjuvant Adriamycin+ Cytoxan (07/18/17-08/29/17), 12 cycles of taxol.  Right lumpectomy with sentinel lymph biopsy on 12/29/2017 revealed: ypT2 ypN0 (i+) (sn), grade 2.  Based on 104 nodes having isolated tumor cells was recommended that peripheral lymphatics be treated.  She received 5040 cGy to right breast and peripheral lymphatics with boost of 1600 cGy, completed on 04/07/18. Started Zoladex on 04/16/18 and Aromasin on 04/29/18. Tolerating moderately well with some residual radiation skin changes and hot flashes.   2. Contact dermatitis- localized skin inflammation. Presentation not consistent with lymphedema. Improving. Discussed use of otc hydrocortisone cream d/t itching. Continue to monitor.  Patient advised to notify the clinic if there is no improvement in symptoms or if symptoms worsen in next 3-4 days.     Visit Diagnosis 1. Malignant neoplasm of upper-outer quadrant of right breast in female, estrogen receptor positive ([place])   2. Contact dermatitis, unspecified contact dermatitis type, unspecified trigger    Patient expressed understanding and was in agreement with this plan. She also understands that She can call clinic at any time with any questions, concerns, or complaints.   Thank you for allowing me to participate in the care of this very pleasant patient.   Beckey Rutter, DNP, AGNP-C [place] at [facility] [phone] (work cell) [phone] (office)  CC: Dr. Tasia Catchings

## 2018-06-16 NOTE — Telephone Encounter (Signed)
I spoke withinfusion charge nurse Blima Singer an dshe will notify Dr Tasia Catchings when patient is here

## 2018-06-22 ENCOUNTER — Ambulatory Visit
Admission: RE | Admit: 2018-06-22 | Discharge: 2018-06-22 | Disposition: A | Discharge status: Home / Self Care | Payer: 59 | Source: Ambulatory Visit | Attending: Oncology | Admitting: Oncology

## 2018-06-22 DIAGNOSIS — C50411 Malignant neoplasm of upper-outer quadrant of right female breast: Secondary | ICD-10-CM

## 2018-06-22 DIAGNOSIS — Z170 Estrogen receptor positive status [ER+]: Secondary | ICD-10-CM | POA: Insufficient documentation

## 2018-06-22 HISTORY — DX: Malignant neoplasm of upper-outer quadrant of right female breast: C50.411

## 2018-06-22 HISTORY — DX: Estrogen receptor positive status (ER+): Z17.0

## 2018-07-10 ENCOUNTER — Other Ambulatory Visit: Payer: 59

## 2018-07-10 ENCOUNTER — Ambulatory Visit: Payer: 59

## 2018-07-10 ENCOUNTER — Ambulatory Visit: Payer: 59 | Admitting: Oncology

## 2018-07-16 ENCOUNTER — Inpatient Hospital Stay: Payer: 59 | Attending: Oncology

## 2018-07-16 ENCOUNTER — Encounter: Payer: Self-pay | Admitting: Oncology

## 2018-07-16 ENCOUNTER — Inpatient Hospital Stay: Payer: 59

## 2018-07-16 ENCOUNTER — Other Ambulatory Visit: Payer: Self-pay

## 2018-07-16 ENCOUNTER — Inpatient Hospital Stay (HOSPITAL_BASED_OUTPATIENT_CLINIC_OR_DEPARTMENT_OTHER): Payer: 59 | Admitting: Oncology

## 2018-07-16 VITALS — BP 131/88 | HR 97 | Temp 96.4°F | Resp 18 | Wt 187.6 lb

## 2018-07-16 DIAGNOSIS — C50411 Malignant neoplasm of upper-outer quadrant of right female breast: Secondary | ICD-10-CM | POA: Insufficient documentation

## 2018-07-16 DIAGNOSIS — Z170 Estrogen receptor positive status [ER+]: Secondary | ICD-10-CM

## 2018-07-16 DIAGNOSIS — Z9221 Personal history of antineoplastic chemotherapy: Secondary | ICD-10-CM | POA: Insufficient documentation

## 2018-07-16 DIAGNOSIS — E785 Hyperlipidemia, unspecified: Secondary | ICD-10-CM | POA: Insufficient documentation

## 2018-07-16 DIAGNOSIS — Z95828 Presence of other vascular implants and grafts: Secondary | ICD-10-CM

## 2018-07-16 DIAGNOSIS — Z79899 Other long term (current) drug therapy: Secondary | ICD-10-CM

## 2018-07-16 DIAGNOSIS — N951 Menopausal and female climacteric states: Secondary | ICD-10-CM

## 2018-07-16 DIAGNOSIS — Z79811 Long term (current) use of aromatase inhibitors: Secondary | ICD-10-CM

## 2018-07-16 LAB — CBC WITH DIFFERENTIAL/PLATELET
Abs Immature Granulocytes: 0.03 10*3/uL (ref 0.00–0.07)
Basophils Absolute: 0 10*3/uL (ref 0.0–0.1)
Basophils Relative: 0 %
Eosinophils Absolute: 0.1 10*3/uL (ref 0.0–0.5)
Eosinophils Relative: 1 %
HCT: 39.9 % (ref 36.0–46.0)
Hemoglobin: 13 g/dL (ref 12.0–15.0)
Immature Granulocytes: 0 %
Lymphocytes Relative: 18 %
Lymphs Abs: 1.5 10*3/uL (ref 0.7–4.0)
MCH: 28.4 pg (ref 26.0–34.0)
MCHC: 32.6 g/dL (ref 30.0–36.0)
MCV: 87.3 fL (ref 80.0–100.0)
MONOS PCT: 6 %
Monocytes Absolute: 0.5 10*3/uL (ref 0.1–1.0)
Neutro Abs: 6 10*3/uL (ref 1.7–7.7)
Neutrophils Relative %: 75 %
Platelets: 291 10*3/uL (ref 150–400)
RBC: 4.57 MIL/uL (ref 3.87–5.11)
RDW: 11.3 % — ABNORMAL LOW (ref 11.5–15.5)
WBC: 8 10*3/uL (ref 4.0–10.5)
nRBC: 0 % (ref 0.0–0.2)

## 2018-07-16 LAB — LIPID PANEL
Cholesterol: 223 mg/dL — ABNORMAL HIGH (ref 0–200)
HDL: 40 mg/dL — ABNORMAL LOW (ref >40)
LDL Cholesterol: 133 mg/dL — ABNORMAL HIGH (ref 0–99)
Total CHOL/HDL Ratio: 5.6 RATIO
Triglycerides: 250 mg/dL — ABNORMAL HIGH (ref <150)
VLDL: 50 mg/dL — ABNORMAL HIGH (ref 0–40)

## 2018-07-16 LAB — COMPREHENSIVE METABOLIC PANEL
ALBUMIN: 4.3 g/dL (ref 3.5–5.0)
ALT: 16 U/L (ref 0–44)
AST: 20 U/L (ref 15–41)
Alkaline Phosphatase: 62 U/L (ref 38–126)
Anion gap: 10 (ref 5–15)
BUN: 17 mg/dL (ref 6–20)
CO2: 26 mmol/L (ref 22–32)
Calcium: 9.3 mg/dL (ref 8.9–10.3)
Chloride: 104 mmol/L (ref 98–111)
Creatinine, Ser: 0.52 mg/dL (ref 0.44–1.00)
GFR calc Af Amer: >60 mL/min (ref >60)
GFR calc non Af Amer: >60 mL/min (ref >60)
Glucose, Bld: 96 mg/dL (ref 70–99)
Potassium: 3.9 mmol/L (ref 3.5–5.1)
Sodium: 140 mmol/L (ref 135–145)
Total Bilirubin: 0.4 mg/dL (ref 0.3–1.2)
Total Protein: 7.6 g/dL (ref 6.5–8.1)

## 2018-07-16 MED ORDER — GOSERELIN ACETATE 3.6 MG ~~LOC~~ IMPL
3.6000 mg | DRUG_IMPLANT | Freq: Once | SUBCUTANEOUS | Status: AC
  Administered 2018-07-16: 3.6 mg via SUBCUTANEOUS
  Filled 2018-07-16: qty 3.6

## 2018-07-16 MED ORDER — HEPARIN SOD (PORK) LOCK FLUSH 100 UNIT/ML IV SOLN
500.0000 [IU] | Freq: Once | INTRAVENOUS | Status: AC
  Administered 2018-07-16: 500 [IU] via INTRAVENOUS
  Filled 2018-07-16: qty 5

## 2018-07-16 MED ORDER — SODIUM CHLORIDE 0.9% FLUSH
10.0000 mL | Freq: Once | INTRAVENOUS | Status: AC
  Administered 2018-07-16: 10 mL via INTRAVENOUS
  Filled 2018-07-16: qty 10

## 2018-07-16 NOTE — Progress Notes (Signed)
Hematology/Oncology Follow up note [facility] [phone] [phone]   Patient Care Team: [name], NP as PCP - General (Nurse Practitioner) Theodore Demark, RN as Oncology Nurse Navigator [name], [name], MD as Referring Physician (Surgery) [name], MD as Consulting Physician (Oncology) [name], MD as Consulting Physician (General Surgery) [name], MD as Referring Physician (Radiation Oncology)  REASON FOR VISIT Follow up for management of breast cancer   HISTORY OF PRESENTING ILLNESS:  Anne Wu is a  38 y.o.  female with diagnosis of cT2N0M0, grade 2 invasive mammary carcinoma.  She has significant family history on both maternal side as well as paternal side. She felt a right breast mass 2 months ago and diagnostic mammogram showed right upper quandrant 2.2 cm mass, which was confirmed on [place] and biopsied. Right axillary no clinically suspicious lymph node. Pathology showed invasive mammary carcinoma, ER/PR positive,, HER2 IHC  equivocal, FISH negative.   # Patient was evaluated by Dr.Smith. From surgical standpoint, Dr.Smith recommend neoadjuvant chemotherapy to facilitate surgery to obtain negative margin and better surgical outcome. Also given patient's young age, neoadjuvant chemotherapy is reasonable.    #Patient stated that she does not desire future fertility.  # 2D Echo showed normal systolic function, mild mitral regurgitation.  # CT was done  to rule out metastatic disease given micrometastasis. Images were independently reviewed by me and discussed with patient.  Negative for metastatic disease. # Testing did not reveal any pathogenic mutation in any of these genes. A copy of the genetic test report will be scanned into Epic under the Media tab.The genes analyzed were the 23 genes on [facility]'s Breast/GYN panel (ATM, BARD1, BRCA1, BRCA2, BRIP1, CDH1, CHEK2, DICER1, EPCAM, MLH1,  MSH2, MSH6,  NBN, NF1, PALB2, PMS2, PTEN, RAD50, RAD51C, RAD51D,SMARCA4, STK11, and TP53). 09/08/2017 Interval mammogram after 4 cycle of AC, showed good treatment response. The biopsy-proven malignancy in the upper outer quadrant of the right breast at posterior depth, associated with scattered microcalcifications and architectural distortion, has significantly decreased in size since the mammogram 06/20/2017. On today's mammogram, the mass measures approximately 1.7 x 2.2 x 1.2 cm (previously 2.4 x 1.6 x 2.7 cm).   Treatment Summary :  Jan 2019- May 2019 Neoadjuvant AC-->T  12/29/2017 Right lumpectomy and sentinel LN biopsy Pathology showed ypT2 ypN0 (i+) (sn) case was discussed on tumor board.ER/PR positive, HER 2 negative.  Aug 2019-September 2019 Adjuvant RT  Oct 2019 to start Zoladex and Aromasin.   INTERVAL HISTORY Anne Wu is a 38 y.o. female who has above history reviewed by me presents for follow up.  S/p neoadjuvant AC-->T, right lumpectomy and sentinel LN biopsy, followed by adjuvant RT.  Patient is on ovarian suppression along with Aromasin. Reports tolerating well with some side effects including achiness, fatigue, joint pain and hot flashes.  Manageable. Denies any new complaints. Patient is working on improving lifestyle such as dieting and exercising.  Patient feels better .      Review of Systems  Constitutional: Positive for fatigue. Negative for appetite change, chills and fever.  HENT:   Negative for hearing loss and voice change.   Eyes: Negative for eye problems.  Respiratory: Negative for chest tightness and cough.   Cardiovascular: Negative for chest pain.  Gastrointestinal: Negative for abdominal distention, abdominal pain and blood in stool.  Endocrine: Positive for hot flashes.  Genitourinary: Negative for difficulty urinating and frequency.   Musculoskeletal: Positive for arthralgias.  Skin: Negative for itching and rash.  Neurological: Negative for dizziness,  extremity weakness and light-headedness.  Hematological: Negative for adenopathy.  Psychiatric/Behavioral: The patient is not nervous/anxious.     MEDICAL HISTORY:  Past Medical History:  Diagnosis Date  . Asthma    AS A CHILD-NO INHALERS  . Breast cancer ([place]) 2018   Right breast cancer-[facility]  . Elevated blood pressure, situational    PT STATES HER LAST COUPLE OF MD APPOINTMENTS SHE HAS HAD ELEVATED BP-NEVER HAD A PROBLEM WITH THIS PREVIOUSLY  . Genetic testing 09/05/2017   Breast/GYN panel (23 genes) @ [facility] - No pathogenic mutations detected  . GERD (gastroesophageal reflux disease)    OCC- NO MEDS  . Malignant neoplasm of upper-outer quadrant of right breast in female, estrogen receptor positive ([place]) 2019   [facility] and DCIS  . Personal history of chemotherapy     SURGICAL HISTORY: Past Surgical History:  Procedure Laterality Date  . BREAST BIOPSY Right 06/20/2017   Invasive Mammary Carcinoma  . BREAST EXCISIONAL BIOPSY Right 12/29/2017   RESIDUAL INVASIVE MAMMARY CARCINOMA and DCIS  . BREAST LUMPECTOMY Right 12/29/2017   needle localization and sentinel node injection  . NO PAST SURGERIES    . PARTIAL MASTECTOMY WITH NEEDLE LOCALIZATION Right 12/29/2017   Procedure: PARTIAL MASTECTOMY WITH NEEDLE LOCALIZATION;  Surgeon: [name], MD;  Location: [facility] ORS;  Service: General;  Laterality: Right;  . PORTACATH PLACEMENT N/A 07/11/2017   Procedure: INSERTION PORT-A-CATH;  Surgeon: Leonie Green, MD;  Location: [facility] ORS;  Service: General;  Laterality: N/A;  . SENTINEL NODE BIOPSY Right 12/29/2017   Procedure: SENTINEL NODE BIOPSY;  Surgeon: [name], MD;  Location: [facility] ORS;  Service: General;  Laterality: Right;    SOCIAL HISTORY: Social History   Socioeconomic History  . Marital status: Married    Spouse name: Not on file  . Number of children: Not on file  . Years of education: Not on file  . Highest education level: Not on file    Occupational History  . Not on file  Social Needs  . Financial resource strain: Not on file  . Food insecurity:    Worry: Not on file    Inability: Not on file  . Transportation needs:    Medical: Not on file    Non-medical: Not on file  Tobacco Use  . Smoking status: Never Smoker  . Smokeless tobacco: Never Used  Substance and Sexual Activity  . Alcohol use: No    Alcohol/week: 0.0 standard drinks  . Drug use: No  . Sexual activity: Yes  Lifestyle  . Physical activity:    Days per week: Not on file    Minutes per session: Not on file  . Stress: Not on file  Relationships  . Social connections:    Talks on phone: Not on file    Gets together: Not on file    Attends religious service: Not on file    Active member of club or organization: Not on file    Attends meetings of clubs or organizations: Not on file    Relationship status: Not on file  . Intimate partner violence:    Fear of current or ex partner: Not on file    Emotionally abused: Not on file    Physically abused: Not on file    Forced sexual activity: Not on file  Other Topics Concern  . Not on file  Social History Narrative  . Not on file    FAMILY HISTORY: Family History  Problem Relation Age  of Onset  . Breast cancer Maternal Aunt 72       currently late 55s  . Breast cancer Maternal Grandmother 68       second breast vs. recurrence ca at 64; deceased at 70  . Colon cancer Maternal Uncle 39       deceased in 39s  . Breast cancer Paternal Aunt        age at dx unknown; currently in 67s  . Breast cancer Paternal Grandmother        age at dx unknown  . Melanoma Mother        on leg  . Testicular cancer Maternal Uncle 62       currently 26s    ALLERGIES:  is allergic to codeine.  MEDICATIONS:  Current Outpatient Medications  Medication Sig Dispense Refill  . acetaminophen (TYLENOL) 325 MG tablet Take 650 mg by mouth every 6 (six) hours as needed for moderate pain or headache.     .  cholecalciferol (VITAMIN D) 1000 units tablet Take 1,000 Units by mouth daily.    Marland Kitchen exemestane (AROMASIN) 25 MG tablet Take 1 tablet (25 mg total) by mouth daily after breakfast. 30 tablet 3  . lidocaine-prilocaine (EMLA) cream Apply to affected area once 30 g 3  . loratadine (CLARITIN) 10 MG tablet Take 10 mg by mouth daily as needed for allergies.     . Multiple Vitamin (MULTI-VITAMINS) TABS Take 1 tablet by mouth daily.     . silver sulfADIAZINE (SILVADENE) 1 % cream Apply 1 application topically 2 (two) times daily. (Patient not taking: Reported on 05/14/2018) 50 g 2   No current facility-administered medications for this visit.    Facility-Administered Medications Ordered in Other Visits  Medication Dose Route Frequency Provider Last Rate Last Dose  . heparin lock flush 100 unit/mL  500 Units Intravenous Once [name], MD      . sodium chloride flush (NS) 0.9 % injection 10 mL  10 mL Intravenous PRN [name], MD   10 mL at 08/01/17 0829     PHYSICAL EXAMINATION: ECOG PERFORMANCE STATUS: 1 - Symptomatic but completely ambulatory Vitals:   07/16/18 1437  BP: 131/88  Pulse: 97  Resp: 18  Temp: (!) 96.4 F (35.8 C)   Filed Weights   07/16/18 1437  Weight: 187 lb 9.6 oz (85.1 kg)    Physical Exam  Constitutional: She is oriented to person, place, and time. No distress.  HENT:  Head: Normocephalic and atraumatic.  Nose: Nose normal.  Mouth/Throat: Oropharynx is clear and moist. No oropharyngeal exudate.  Eyes: Pupils are equal, round, and reactive to light. EOM are normal. Right eye exhibits no discharge. Left eye exhibits no discharge. No scleral icterus.  Neck: Normal range of motion. Neck supple. No JVD present.  Cardiovascular: Normal rate, regular rhythm, normal heart sounds and intact distal pulses. Exam reveals no friction rub.  No murmur heard. Pulmonary/Chest: Effort normal and breath sounds normal. No respiratory distress. She has no wheezes. She has no rales. She  exhibits no tenderness.  Abdominal: Soft. Bowel sounds are normal. She exhibits no distension and no mass. There is no abdominal tenderness. There is no rebound and no guarding.  Musculoskeletal: Normal range of motion.        General: No tenderness, deformity or edema.  Lymphadenopathy:    She has no cervical adenopathy.  Neurological: She is alert and oriented to person, place, and time. No cranial nerve deficit. She exhibits normal muscle tone.  Coordination normal.  Skin: Skin is warm and dry. No rash noted. She is not diaphoretic. No erythema. No pallor.  Psychiatric: Memory, affect and judgment normal.    Lab Results  Component Value Date   WBC 8.0 07/16/2018   HGB 13.0 07/16/2018   HCT 39.9 07/16/2018   MCV 87.3 07/16/2018   PLT 291 07/16/2018   Recent Labs    04/15/18 1410 05/14/18 1336 07/16/18 1418  NA 138 138 140  K 3.8 4.4 3.9  CL 104 104 104  CO2 _0 GLUCOSE 112* 100* 96  BUN _1 CREATININE 0.56 0.59 0.52  CALCIUM 9.3 9.1 9.3  GFRNONAA >60 >60 >60  GFRAA >60 >60 >60  PROT 7.2 7.4 7.6  ALBUMIN 4.1 4.1 4.3  AST _2 ALT _3 ALKPHOS 64 63 62  BILITOT 0.4 0.4 0.4    2D Echo was done which showed LVEF 55-65%, mild mitral valve regurgitation.  12/29/2017  Pathology Surgical Pathology  CASE: [id number]  DIAGNOSIS:  A. BREAST MASS, RIGHT; NEEDLE LOCALIZED LUMPECTOMY:  - RESIDUAL INVASIVE MAMMARY CARCINOMA, STATUS POST NEOADJUVANT CHEMOTHERAPY.  - DUCTAL CARCINOMA IN SITU.  - SEE CANCER SUMMARY BELOW.  - PRIOR BIOPSY SITE CHANGE WITH CLIP.   B. SENTINEL LYMPH NODE 1, RIGHT; EXCISION:  - ONE LYMPH NODE WITH FOCUS SUSPICIOUS FOR ISOLATED TUMOR CELLS.  - TWO LYMPH NODES NEGATIVE FOR MALIGNANCY.  - SEE COMMENT.   C. SENTINEL LYMPH NODE 2, RIGHT; EXCISION:  - ONE LYMPH NODE NEGATIVE FOR MALIGNANCY (0/1).   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  Margins:  Invasive Carcinoma Margins: Uninvolved by invasive carcinoma  Distance from closest margin: 2 mm  Specify closest margin: Superior  DCIS Margins: Uninvolved by DCIS  Distance from closest margin: 3 mm  Specify closest margin: Superior  Regional Lymph Nodes: Involved by tumor cells       Number of Lymph Nodes with Macrometastases (>2 mm): 0       Number of Lymph Nodes with Micrometastases (>0.2 mm to 2 mm  or >200 cells): 0  Number of Lymph Nodes with Isolated Tumor Cells (=0.2 mm or =200 cells): 1  Size of Largest Metastatic Deposit: Less than 0.2 mm  Extranodal Extension: Not identified  Number of Lymph Nodes Examined: 4  Number of Sentinel Nodes Examined: 4  Treatment Effect:  Treatment Effect in the Breast: Definite response to presurgical therapy in the invasive carcinoma  Treatment Effect in the Lymph Nodes: Suspicious for isolated tumor cells, no prominent fibrous scarring in the nodes  Residual Cancer Burden (RCB) from MD [facility] calculator (website below):  Primary Tumor Bed  Primary tumor bed: 44 mm x 22 mm  Overall cancer cellularity: 10 %  Percentage of cancer that is in situ: 1 %  Lymph nodes  Number of lymph nodes positive for metastasis: 0  Residual Cancer Burden: 1.695  Residual Cancer Burden Class: RCB-II  [email or web address]  Lymphovascular Invasion: Present  Pathologic Stage Classification (pTNM, AJCC 8th Edition): ypT2 ypN0 (i+) (sn)  TNM Descriptors: y - neoadjuvant   BREAST BIOMARKER TESTS -  performed on prior biopsy  Estrogen Receptor (ER) Status: POSITIVE, >90%  nuclear staining  Progesterone Receptor (PgR) Status: POSITIVE, >90% nuclear staining  HER2 (by immunohistochemistry): EQUIVOCAL  HER2 (ERBB2) (by in situ hybridization): NEGATIVE    RADIOGRAPHIC STUDIES: I have personally reviewed the radiological images as listed and agreed with the findings in the report. 06/22/2018  Diagnostic mammogram bilateral no evidence of malignancy within either breast.  ASSESSMENT & PLAN:  1. Malignant neoplasm of upper-outer quadrant of right breast in female, estrogen receptor positive ([place])   2. Port-A-Cath in place   3. Aromatase inhibitor use   4. Vasomotor symptoms due to menopause   5. Hyperlipidemia, unspecified hyperlipidemia type   Cancer Staging Malignant neoplasm of upper-outer quadrant of right breast in female, estrogen receptor positive ([place]) Staging form: Breast, AJCC 8th Edition - Clinical stage from 07/15/2017: Stage IB (cT2, cN0, cM0, G2, ER: Positive, PR: Positive, HER2: Negative) - Signed by [name], MD on 07/15/2017 #ypT2 ypN0 (i+) (sn), grade 2 breast cancer, ER/PR positive, HER 2 negative. Tolerating ovarian suppression and Aromasin well. Diagnostic mammogram were independent reviewed and discussed with patient. Labs are reviewed and discussed with patient. Proceed with Zoladex today.  Continue Aromasin  Porta Cath in place, continue port flush every 8 weeks.   #Hyperlipidemia, LDL profile was obtained.  Triglyceride may be elevated due to not a fasting state.   Elevated total cholesterol, decreased HDL, increased LDL and VLDL.  Recommend patient follow-up with primary care physician for further discussion.  We spent sufficient time to discuss many aspect of care, questions were answered to patient's satisfaction. The patient knows to call the clinic with any problems questions or concerns.  Return visit: She will follow-up monthly for Zoladex injections, obtain CBC, CMP and follow-up in the clinic in 3 months   [name], MD,  PhD Hematology [place] [facility] Pager- [phone] 07/16/2018

## 2018-07-16 NOTE — Progress Notes (Signed)
Patient here for follow up. Pt has improved lifestyle changes such as diet and exercise and she states she feels better.

## 2018-07-17 ENCOUNTER — Encounter: Payer: Self-pay | Admitting: *Deleted

## 2018-08-15 IMAGING — US ULTRASOUND RIGHT BREAST LIMITED
1 series · 8 of 8 positions shown · non-contrast
Comparison: Previous exam(s).

CLINICAL DATA: A patient with right breast 10 o'clock known
invasive mammary carcinoma, post neoadjuvant chemotherapy.

EXAM:
DIGITAL DIAGNOSTIC RIGHT MAMMOGRAM WITH CAD AND TOMO
ULTRASOUND RIGHT BREAST

[Series 1: ultrasound right breast limited · 0.06mm/px · 8 of 8 slices shown]
[im 1/8]
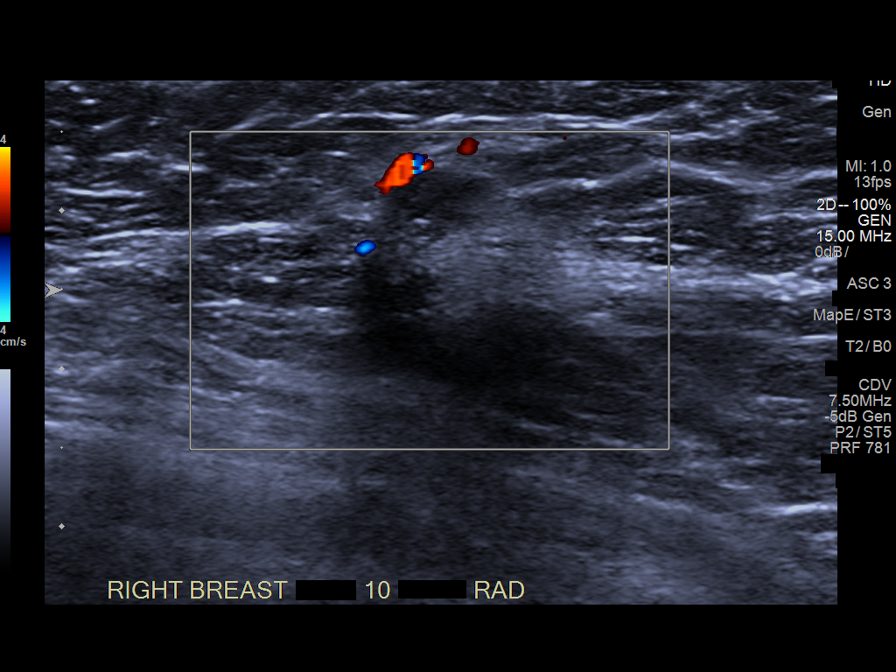
[im 2/8]
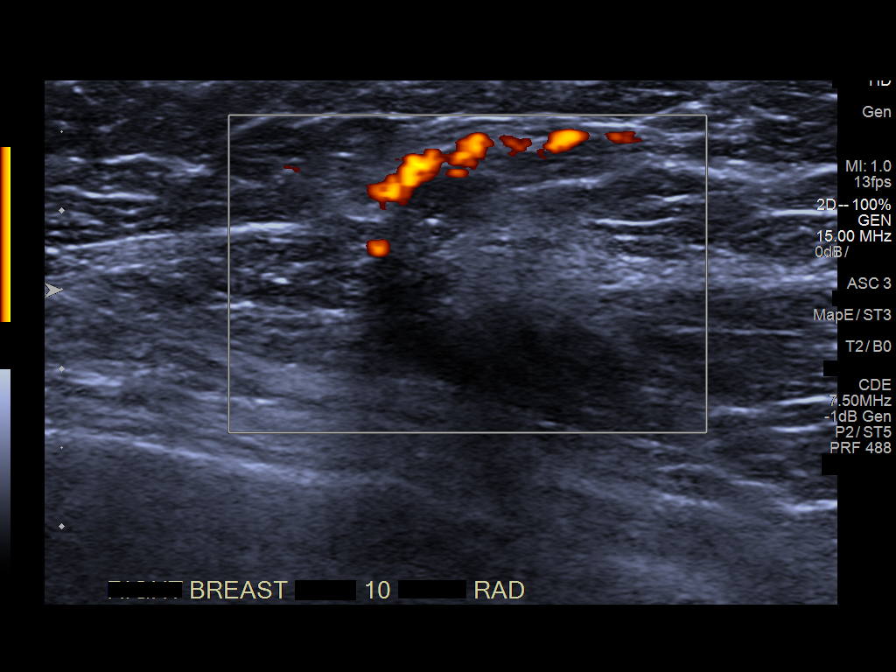
[im 3/8]
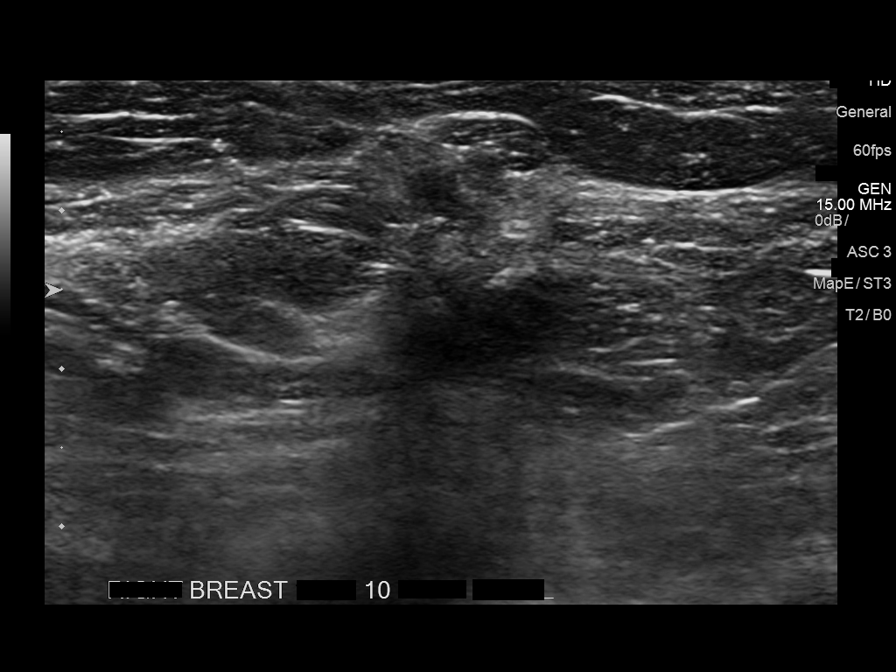
[im 4/8]
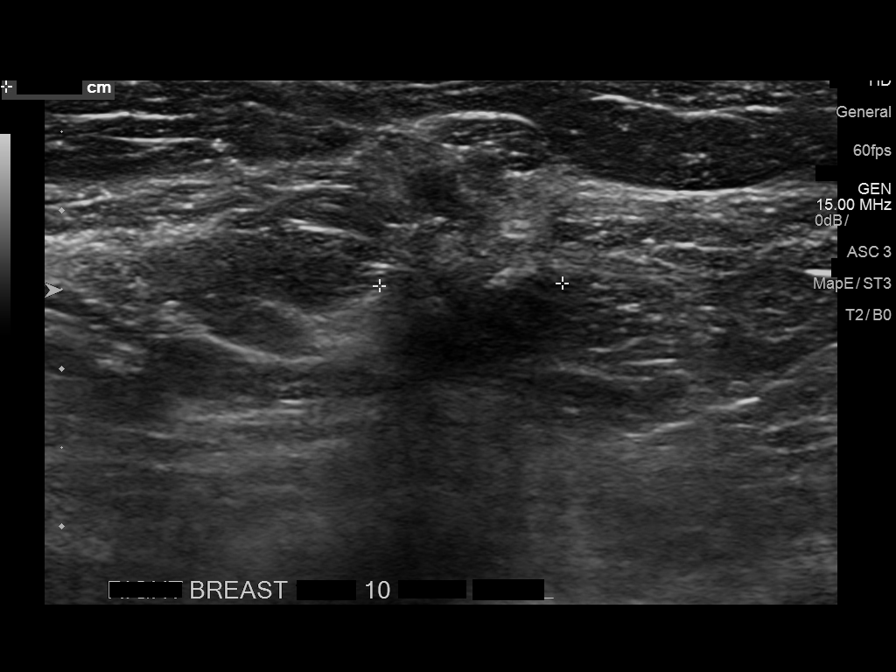
[im 5/8]
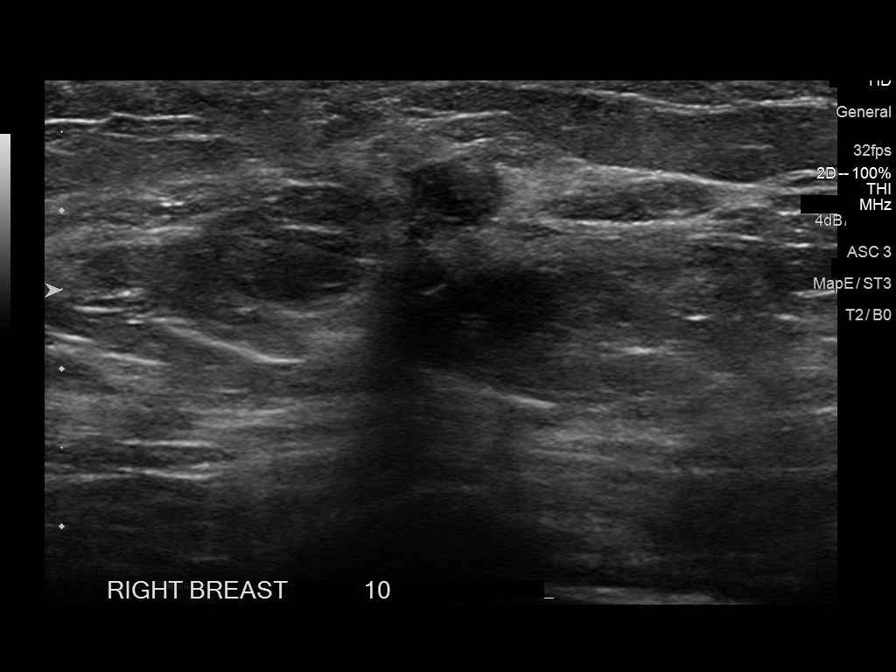
[im 6/8]
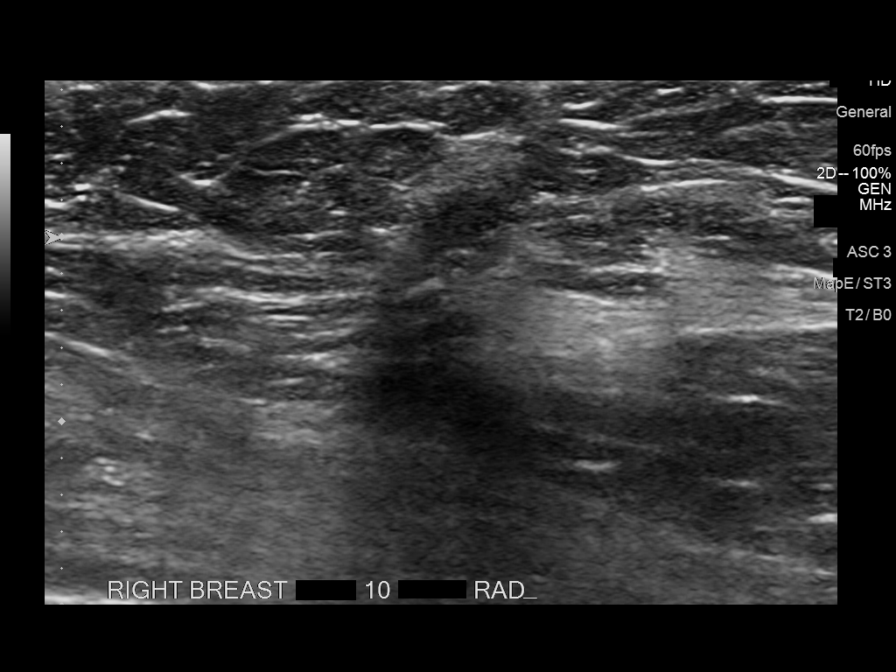
[im 7/8]
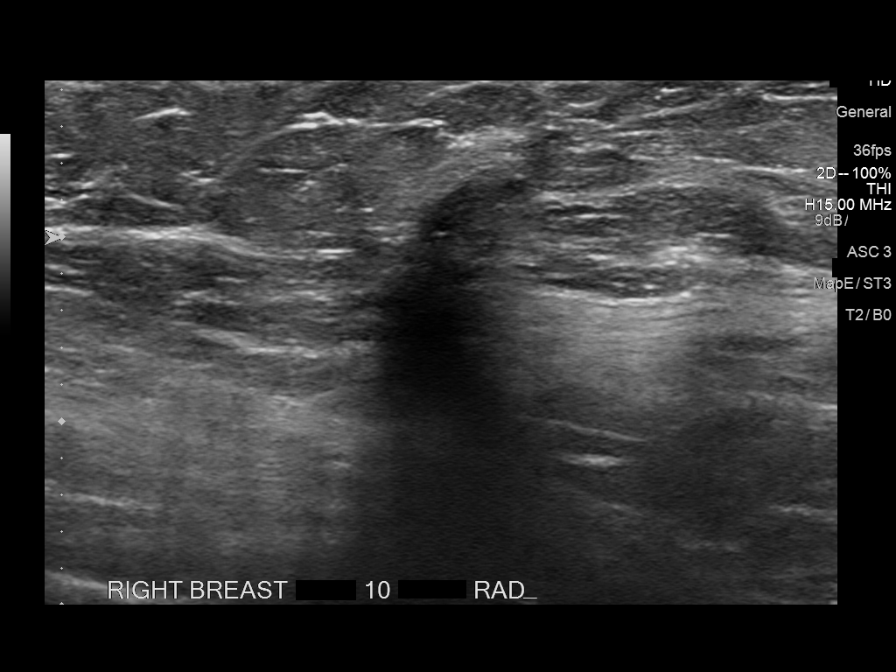
[im 8/8]
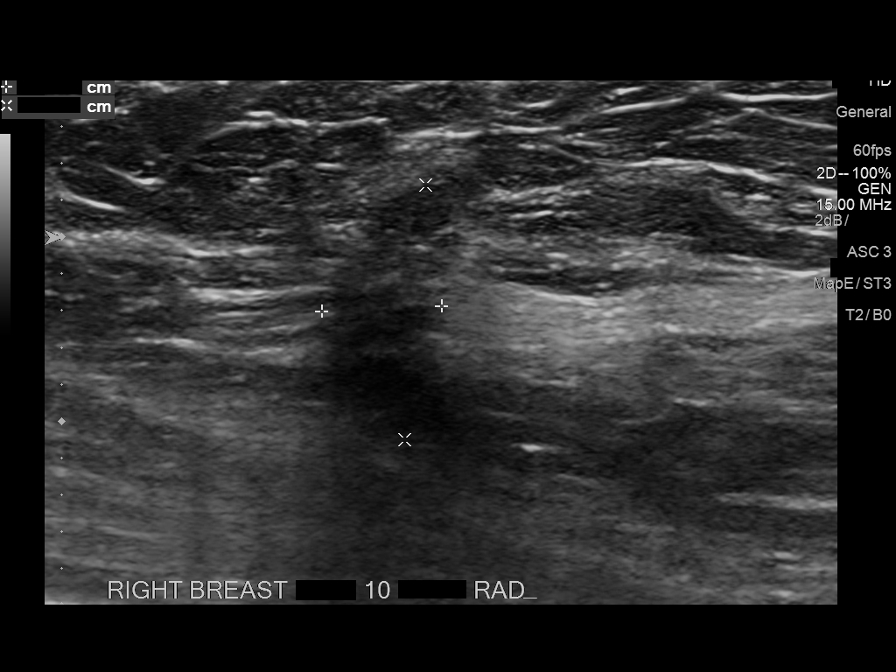

[8 of 8 positions shown; findings below may reference images not displayed]

ACR Breast Density Category b: There are scattered areas of
fibroglandular density.
FINDINGS: The known right breast 10 o'clock, posterior depth, spiculated mass
has a less masslike appearance mammographically, but is grossly
stable in size.

Mammographic images were processed with CAD.

Targeted ultrasound is performed, showing right breast 10 o'clock 10
cm from the nipple hypoechoic irregular mass measuring 1.2 x 0.7 x
1.4 cm. This mass previously measured 1.3 x 1.0 by 1.3 cm,
09/08/2017.
IMPRESSION: Slight decrease in size in the known right breast 10 o'clock
invasive mammary carcinoma.

RECOMMENDATION:
Continue with treatment plan.

I have discussed the findings and recommendations with the patient.
Results were also provided in writing at the conclusion of the
visit. If applicable, a reminder letter will be sent to the patient
regarding the next appointment.

BI-RADS CATEGORY  6: Known biopsy-proven malignancy.

## 2018-08-17 ENCOUNTER — Inpatient Hospital Stay: Payer: 59 | Attending: Oncology

## 2018-08-17 DIAGNOSIS — Z9221 Personal history of antineoplastic chemotherapy: Secondary | ICD-10-CM | POA: Insufficient documentation

## 2018-08-17 DIAGNOSIS — E785 Hyperlipidemia, unspecified: Secondary | ICD-10-CM | POA: Insufficient documentation

## 2018-08-17 DIAGNOSIS — Z170 Estrogen receptor positive status [ER+]: Secondary | ICD-10-CM | POA: Diagnosis not present

## 2018-08-17 DIAGNOSIS — C50411 Malignant neoplasm of upper-outer quadrant of right female breast: Secondary | ICD-10-CM | POA: Diagnosis present

## 2018-08-17 DIAGNOSIS — Z79899 Other long term (current) drug therapy: Secondary | ICD-10-CM | POA: Diagnosis not present

## 2018-08-17 MED ORDER — GOSERELIN ACETATE 3.6 MG ~~LOC~~ IMPL
3.6000 mg | DRUG_IMPLANT | Freq: Once | SUBCUTANEOUS | Status: AC
  Administered 2018-08-17: 3.6 mg via SUBCUTANEOUS
  Filled 2018-08-17: qty 3.6

## 2018-08-28 ENCOUNTER — Encounter: Payer: Self-pay | Admitting: *Deleted

## 2018-09-03 ENCOUNTER — Other Ambulatory Visit: Payer: Self-pay | Admitting: Oncology

## 2018-09-03 DIAGNOSIS — C50411 Malignant neoplasm of upper-outer quadrant of right female breast: Secondary | ICD-10-CM

## 2018-09-03 DIAGNOSIS — Z170 Estrogen receptor positive status [ER+]: Principal | ICD-10-CM

## 2018-09-07 ENCOUNTER — Other Ambulatory Visit: Payer: Self-pay | Admitting: *Deleted

## 2018-09-07 ENCOUNTER — Encounter: Payer: Self-pay | Admitting: Oncology

## 2018-09-07 ENCOUNTER — Encounter: Payer: Self-pay | Admitting: *Deleted

## 2018-09-07 MED ORDER — EXEMESTANE 25 MG PO TABS: 25.0000 mg | ORAL_TABLET | Freq: Every day | ORAL | 0 refills | Status: DC

## 2018-09-07 NOTE — Progress Notes (Signed)
  Oncology Nurse Navigator Documentation  Navigator Location: [facility] (09/07/18 1300)   )Navigator Encounter Type: Telephone (09/07/18 1300) Telephone: Incoming Call (09/07/18 1300)                       Barriers/Navigation Needs: Coordination of Care (09/07/18 1300)                          Time Spent with Patient: 15 (09/07/18 1300)   Patient left message that she needed her Aromasin prescription changed to a 30 day supply since her insurance would not pay for the 90 supply.  Informed Almyra Free, Dr. Collie Siad CMA and prescription e-scribed.  Left message to inform patient of the changes.

## 2018-09-15 ENCOUNTER — Inpatient Hospital Stay: Payer: 59 | Attending: Oncology

## 2018-09-15 DIAGNOSIS — Z170 Estrogen receptor positive status [ER+]: Secondary | ICD-10-CM | POA: Diagnosis not present

## 2018-09-15 DIAGNOSIS — Z9221 Personal history of antineoplastic chemotherapy: Secondary | ICD-10-CM | POA: Diagnosis not present

## 2018-09-15 DIAGNOSIS — C50411 Malignant neoplasm of upper-outer quadrant of right female breast: Secondary | ICD-10-CM | POA: Diagnosis present

## 2018-09-15 DIAGNOSIS — Z79899 Other long term (current) drug therapy: Secondary | ICD-10-CM | POA: Insufficient documentation

## 2018-09-15 DIAGNOSIS — E785 Hyperlipidemia, unspecified: Secondary | ICD-10-CM | POA: Insufficient documentation

## 2018-09-15 MED ORDER — GOSERELIN ACETATE 3.6 MG ~~LOC~~ IMPL
3.6000 mg | DRUG_IMPLANT | Freq: Once | SUBCUTANEOUS | Status: AC
  Administered 2018-09-15: 3.6 mg via SUBCUTANEOUS
  Filled 2018-09-15: qty 3.6

## 2018-09-30 ENCOUNTER — Encounter: Payer: Self-pay | Admitting: Oncology

## 2018-09-30 ENCOUNTER — Other Ambulatory Visit: Payer: Self-pay

## 2018-09-30 ENCOUNTER — Inpatient Hospital Stay: Payer: 59

## 2018-09-30 DIAGNOSIS — C50411 Malignant neoplasm of upper-outer quadrant of right female breast: Secondary | ICD-10-CM | POA: Diagnosis not present

## 2018-09-30 DIAGNOSIS — Z95828 Presence of other vascular implants and grafts: Secondary | ICD-10-CM

## 2018-09-30 MED ORDER — HEPARIN SOD (PORK) LOCK FLUSH 100 UNIT/ML IV SOLN
500.0000 [IU] | Freq: Once | INTRAVENOUS | Status: AC
  Administered 2018-09-30: 500 [IU] via INTRAVENOUS

## 2018-09-30 MED ORDER — SODIUM CHLORIDE 0.9% FLUSH
10.0000 mL | Freq: Once | INTRAVENOUS | Status: AC
  Administered 2018-09-30: 10 mL via INTRAVENOUS
  Filled 2018-09-30: qty 10

## 2018-10-14 ENCOUNTER — Ambulatory Visit: Payer: 59 | Admitting: Radiation Oncology

## 2018-10-15 ENCOUNTER — Other Ambulatory Visit: Payer: Self-pay

## 2018-10-15 DIAGNOSIS — C50411 Malignant neoplasm of upper-outer quadrant of right female breast: Secondary | ICD-10-CM

## 2018-10-15 DIAGNOSIS — Z170 Estrogen receptor positive status [ER+]: Principal | ICD-10-CM

## 2018-10-16 ENCOUNTER — Encounter: Payer: Self-pay | Admitting: Oncology

## 2018-10-16 ENCOUNTER — Other Ambulatory Visit: Payer: Self-pay

## 2018-10-16 ENCOUNTER — Ambulatory Visit
Admission: RE | Admit: 2018-10-16 | Discharge: 2018-10-16 | Disposition: A | Discharge status: Home / Self Care | Payer: 59 | Source: Ambulatory Visit | Attending: Radiation Oncology | Admitting: Radiation Oncology

## 2018-10-16 ENCOUNTER — Inpatient Hospital Stay (HOSPITAL_BASED_OUTPATIENT_CLINIC_OR_DEPARTMENT_OTHER): Payer: 59 | Admitting: Oncology

## 2018-10-16 ENCOUNTER — Ambulatory Visit: Payer: 59

## 2018-10-16 ENCOUNTER — Inpatient Hospital Stay: Payer: 59 | Attending: Oncology

## 2018-10-16 ENCOUNTER — Inpatient Hospital Stay: Payer: 59

## 2018-10-16 ENCOUNTER — Encounter: Payer: Self-pay | Admitting: Radiation Oncology

## 2018-10-16 VITALS — BP 125/81 | HR 89 | Temp 98.6°F | Resp 16 | Wt 170.1 lb

## 2018-10-16 VITALS — BP 141/96 | HR 82 | Temp 98.6°F | Resp 18 | Wt 169.9 lb

## 2018-10-16 DIAGNOSIS — Z170 Estrogen receptor positive status [ER+]: Principal | ICD-10-CM

## 2018-10-16 DIAGNOSIS — Z923 Personal history of irradiation: Secondary | ICD-10-CM | POA: Insufficient documentation

## 2018-10-16 DIAGNOSIS — N951 Menopausal and female climacteric states: Secondary | ICD-10-CM

## 2018-10-16 DIAGNOSIS — C50411 Malignant neoplasm of upper-outer quadrant of right female breast: Secondary | ICD-10-CM | POA: Diagnosis present

## 2018-10-16 DIAGNOSIS — Z79899 Other long term (current) drug therapy: Secondary | ICD-10-CM | POA: Insufficient documentation

## 2018-10-16 DIAGNOSIS — Z9221 Personal history of antineoplastic chemotherapy: Secondary | ICD-10-CM | POA: Insufficient documentation

## 2018-10-16 DIAGNOSIS — Z79811 Long term (current) use of aromatase inhibitors: Secondary | ICD-10-CM | POA: Insufficient documentation

## 2018-10-16 DIAGNOSIS — E785 Hyperlipidemia, unspecified: Secondary | ICD-10-CM | POA: Diagnosis not present

## 2018-10-16 DIAGNOSIS — Z95828 Presence of other vascular implants and grafts: Secondary | ICD-10-CM

## 2018-10-16 LAB — COMPREHENSIVE METABOLIC PANEL
ALT: 11 U/L (ref 0–44)
AST: 18 U/L (ref 15–41)
Albumin: 4.4 g/dL (ref 3.5–5.0)
Alkaline Phosphatase: 69 U/L (ref 38–126)
Anion gap: 6 (ref 5–15)
BUN: 14 mg/dL (ref 6–20)
CO2: 28 mmol/L (ref 22–32)
Calcium: 9.2 mg/dL (ref 8.9–10.3)
Chloride: 103 mmol/L (ref 98–111)
Creatinine, Ser: 0.57 mg/dL (ref 0.44–1.00)
GFR calc Af Amer: >60 mL/min (ref >60)
GFR calc non Af Amer: >60 mL/min (ref >60)
Glucose, Bld: 92 mg/dL (ref 70–99)
Potassium: 4 mmol/L (ref 3.5–5.1)
Sodium: 137 mmol/L (ref 135–145)
Total Bilirubin: 0.5 mg/dL (ref 0.3–1.2)
Total Protein: 7.5 g/dL (ref 6.5–8.1)

## 2018-10-16 LAB — CBC WITH DIFFERENTIAL/PLATELET
Abs Immature Granulocytes: 0.01 10*3/uL (ref 0.00–0.07)
Basophils Absolute: 0 10*3/uL (ref 0.0–0.1)
Basophils Relative: 1 %
Eosinophils Absolute: 0.1 10*3/uL (ref 0.0–0.5)
Eosinophils Relative: 2 %
HCT: 41.2 % (ref 36.0–46.0)
Hemoglobin: 13.9 g/dL (ref 12.0–15.0)
Immature Granulocytes: 0 %
Lymphocytes Relative: 22 %
Lymphs Abs: 1.3 10*3/uL (ref 0.7–4.0)
MCH: 29.5 pg (ref 26.0–34.0)
MCHC: 33.7 g/dL (ref 30.0–36.0)
MCV: 87.5 fL (ref 80.0–100.0)
Monocytes Absolute: 0.4 10*3/uL (ref 0.1–1.0)
Monocytes Relative: 6 %
Neutro Abs: 4.1 10*3/uL (ref 1.7–7.7)
Neutrophils Relative %: 69 %
Platelets: 265 10*3/uL (ref 150–400)
RBC: 4.71 MIL/uL (ref 3.87–5.11)
RDW: 11.4 % — ABNORMAL LOW (ref 11.5–15.5)
WBC: 6 10*3/uL (ref 4.0–10.5)
nRBC: 0 % (ref 0.0–0.2)

## 2018-10-16 MED ORDER — GOSERELIN ACETATE 3.6 MG ~~LOC~~ IMPL
3.6000 mg | DRUG_IMPLANT | Freq: Once | SUBCUTANEOUS | Status: AC
  Administered 2018-10-16: 3.6 mg via SUBCUTANEOUS
  Filled 2018-10-16: qty 3.6

## 2018-10-16 NOTE — Progress Notes (Signed)
Radiation Oncology Follow up Note  Name: Anne Wu   Date:   10/16/2018 MRN:  [id number] DOB: May 20, 1981    This 38 y.o. female presents to the clinic today for patient is a 38 year old female now about 6 months having completed whole breast radiation to her right breast and peripheral lymphatics for a T2N0 invasive mammary carcinoma.   REFERRING PROVIDER: [name], NP  HPI: Patient is a 38 year old female now about 6 months having completed neoadjuvant chemotherapy followed by wide local excision and adjuvant whole breast and peripheral emphatic radiation for a T2N0 ER PR positive invasive mammary carcinoma.  She is seen today in routine follow-up and is doing well.  Still has some slight hyperpigmentation in the right supraclavicular region.  No swelling in her right upper extremity.  She specifically denies breast tenderness cough or bone pain..  Patient has not had a mammogram.  She is currently on Aromasin tolerated that well without side effect.  She had bilateral mammograms back in December which I have reviewed and were BI-RADS 2 benign  COMPLICATIONS OF TREATMENT: none  FOLLOW UP COMPLIANCE: keeps appointments   PHYSICAL EXAM:  BP 125/81 (BP Location: Left Arm, Patient Position: Sitting)   Pulse 89   Temp 98.6 F (37 C) (Tympanic)   Resp 16   Wt 170 lb 1.4 oz (77.2 kg)   BMI 29.20 kg/m  Lungs are clear to A&P cardiac examination essentially unremarkable with regular rate and rhythm. No dominant mass or nodularity is noted in either breast in 2 positions examined. Incision is well-healed. No axillary or supraclavicular adenopathy is appreciated. Cosmetic result is excellent.  Well-developed well-nourished patient in NAD. HEENT reveals PERLA, EOMI, discs not visualized.  Oral cavity is clear. No oral mucosal lesions are identified. Neck is clear without evidence of cervical or supraclavicular adenopathy. Lungs are clear to A&P. Cardiac examination is essentially  unremarkable with regular rate and rhythm without murmur rub or thrill. Abdomen is benign with no organomegaly or masses noted. Motor sensory and DTR levels are equal and symmetric in the upper and lower extremities. Cranial nerves II through XII are grossly intact. Proprioception is intact. No peripheral adenopathy or edema is identified. No motor or sensory levels are noted. Crude visual fields are within normal range.  RADIOLOGY RESULTS: No current films for review  PLAN: Present time she is doing well with no evidence of disease.  I am pleased with her overall progress. .  She continues on Aromasin without side effect.  I have asked to see her back in 6 months for follow-up.  Patient knows to call sooner with any concerns.  She continues close follow-up care by medical oncology.  I would like to take this opportunity to thank you for allowing me to participate in the care of your patient.[name], MD

## 2018-10-16 NOTE — Progress Notes (Signed)
Patient here for follow up. Pt had some teeth pulled last month and feels like they might be shifting. She is concerned if shifting or bone loss is associated with aromasin.

## 2018-10-16 NOTE — Progress Notes (Signed)
Hematology/Oncology Follow up note [facility] [phone] [phone]   Patient Care Team: [name], NP as PCP - General (Nurse Practitioner) Theodore Demark, RN as Oncology Nurse Navigator [name], [name], MD as Referring Physician (Surgery) [name], MD as Consulting Physician (Oncology) [name], MD as Consulting Physician (General Surgery) [name], MD as Referring Physician (Radiation Oncology)  REASON FOR VISIT Follow up for management of breast cancer   HISTORY OF PRESENTING ILLNESS:  Anne Wu is a  38 y.o.  female with diagnosis of cT2N0M0, grade 2 invasive mammary carcinoma.  She has significant family history on both maternal side as well as paternal side. She felt a right breast mass 2 months ago and diagnostic mammogram showed right upper quandrant 2.2 cm mass, which was confirmed on [place] and biopsied. Right axillary no clinically suspicious lymph node. Pathology showed invasive mammary carcinoma, ER/PR positive,, HER2 IHC  equivocal, FISH negative.   # Patient was evaluated by Dr.Smith. From surgical standpoint, Dr.Smith recommend neoadjuvant chemotherapy to facilitate surgery to obtain negative margin and better surgical outcome. Also given patient's young age, neoadjuvant chemotherapy is reasonable.    #Patient stated that she does not desire future fertility.  # 2D Echo showed normal systolic function, mild mitral regurgitation.  # CT was done  to rule out metastatic disease given micrometastasis. Images were independently reviewed by me and discussed with patient.  Negative for metastatic disease. # Testing did not reveal any pathogenic mutation in any of these genes. A copy of the genetic test report will be scanned into Epic under the Media tab.The genes analyzed were the 23 genes on [facility]'s Breast/GYN panel (ATM, BARD1, BRCA1, BRCA2, BRIP1, CDH1, CHEK2, DICER1, EPCAM, MLH1,  MSH2, MSH6,  NBN, NF1, PALB2, PMS2, PTEN, RAD50, RAD51C, RAD51D,SMARCA4, STK11, and TP53). 09/08/2017 Interval mammogram after 4 cycle of AC, showed good treatment response. The biopsy-proven malignancy in the upper outer quadrant of the right breast at posterior depth, associated with scattered microcalcifications and architectural distortion, has significantly decreased in size since the mammogram 06/20/2017. On today's mammogram, the mass measures approximately 1.7 x 2.2 x 1.2 cm (previously 2.4 x 1.6 x 2.7 cm).   Treatment Summary :  Jan 2019- May 2019 Neoadjuvant AC-->T  12/29/2017 Right lumpectomy and sentinel LN biopsy Pathology showed ypT2 ypN0 (i+) (sn) case was discussed on tumor board.ER/PR positive, HER 2 negative.  Aug 2019-September 2019 Adjuvant RT  Oct 2019 to start Zoladex and Aromasin.   INTERVAL HISTORY Anne Wu is a 38 y.o. female who has above history reviewed by me presents for follow up for breast cancer management  S/p neoadjuvant AC-->T, right lumpectomy and sentinel LN biopsy, followed by adjuvant RT.  Patient is on ovarian suppression with Zoladex with Aromasin. She reports feeling well today.  She had dental work done recently including pulling teeth.  She feels her teeth might be shifting and is concerned about if shifting or bone loss is associated with Aromasin. Some side effects including achiness, fatigue, joint pain and hot flash.  Denies any new complaints. Denies any new complaints of her breasts     Review of Systems  Constitutional: Positive for fatigue. Negative for appetite change, chills and fever.  HENT:   Negative for hearing loss and voice change.        Recent dental work.  Teeth shifting  Eyes: Negative for eye problems.  Respiratory: Negative for chest tightness and cough.   Cardiovascular: Negative for chest pain.  Gastrointestinal: Negative for abdominal distention, abdominal pain and blood in stool.  Endocrine: Positive for hot flashes.   Genitourinary: Negative for difficulty urinating and frequency.   Musculoskeletal: Positive for arthralgias.  Skin: Negative for itching and rash.  Neurological: Negative for dizziness, extremity weakness and light-headedness.  Hematological: Negative for adenopathy.  Psychiatric/Behavioral: Negative for confusion. The patient is not nervous/anxious.     MEDICAL HISTORY:  Past Medical History:  Diagnosis Date  . Asthma    AS A CHILD-NO INHALERS  . Breast cancer ([place]) 2018   Right breast cancer-[facility]  . Elevated blood pressure, situational    PT STATES HER LAST COUPLE OF MD APPOINTMENTS SHE HAS HAD ELEVATED BP-NEVER HAD A PROBLEM WITH THIS PREVIOUSLY  . Genetic testing 09/05/2017   Breast/GYN panel (23 genes) @ [facility] - No pathogenic mutations detected  . GERD (gastroesophageal reflux disease)    OCC- NO MEDS  . Malignant neoplasm of upper-outer quadrant of right breast in female, estrogen receptor positive ([place]) 2019   [facility] and DCIS  . Personal history of chemotherapy     SURGICAL HISTORY: Past Surgical History:  Procedure Laterality Date  . BREAST BIOPSY Right 06/20/2017   Invasive Mammary Carcinoma  . BREAST EXCISIONAL BIOPSY Right 12/29/2017   RESIDUAL INVASIVE MAMMARY CARCINOMA and DCIS  . BREAST LUMPECTOMY Right 12/29/2017   needle localization and sentinel node injection  . NO PAST SURGERIES    . PARTIAL MASTECTOMY WITH NEEDLE LOCALIZATION Right 12/29/2017   Procedure: PARTIAL MASTECTOMY WITH NEEDLE LOCALIZATION;  Surgeon: [name], MD;  Location: [facility] ORS;  Service: General;  Laterality: Right;  . PORTACATH PLACEMENT N/A 07/11/2017   Procedure: INSERTION PORT-A-CATH;  Surgeon: Leonie Green, MD;  Location: [facility] ORS;  Service: General;  Laterality: N/A;  . SENTINEL NODE BIOPSY Right 12/29/2017   Procedure: SENTINEL NODE BIOPSY;  Surgeon: [name], MD;  Location: [facility] ORS;  Service: General;  Laterality: Right;    SOCIAL HISTORY: Social  History   Socioeconomic History  . Marital status: Married    Spouse name: Not on file  . Number of children: Not on file  . Years of education: Not on file  . Highest education level: Not on file  Occupational History  . Not on file  Social Needs  . Financial resource strain: Not on file  . Food insecurity:    Worry: Not on file    Inability: Not on file  . Transportation needs:    Medical: Not on file    Non-medical: Not on file  Tobacco Use  . Smoking status: Never Smoker  . Smokeless tobacco: Never Used  Substance and Sexual Activity  . Alcohol use: No    Alcohol/week: 0.0 standard drinks  . Drug use: No  . Sexual activity: Yes  Lifestyle  . Physical activity:    Days per week: Not on file    Minutes per session: Not on file  . Stress: Not on file  Relationships  . Social connections:    Talks on phone: Not on file    Gets together: Not on file    Attends religious service: Not on file    Active member of club or organization: Not on file    Attends meetings of clubs or organizations: Not on file    Relationship status: Not on file  . Intimate partner violence:    Fear of current or ex partner: Not on file    Emotionally abused: Not on file    Physically abused:  Not on file    Forced sexual activity: Not on file  Other Topics Concern  . Not on file  Social History Narrative  . Not on file    FAMILY HISTORY: Family History  Problem Relation Age of Onset  . Breast cancer Maternal Aunt 52       currently late 5s  . Breast cancer Maternal Grandmother 68       second breast vs. recurrence ca at 77; deceased at 75  . Colon cancer Maternal Uncle 30       deceased in 17s  . Breast cancer Paternal Aunt        age at dx unknown; currently in 52s  . Breast cancer Paternal Grandmother        age at dx unknown  . Melanoma Mother        on leg  . Testicular cancer Maternal Uncle 40       currently 93s    ALLERGIES:  is allergic to codeine.  MEDICATIONS:   Current Outpatient Medications  Medication Sig Dispense Refill  . acetaminophen (TYLENOL) 325 MG tablet Take 650 mg by mouth every 6 (six) hours as needed for moderate pain or headache.     . cholecalciferol (VITAMIN D) 1000 units tablet Take 1,000 Units by mouth daily.    Marland Kitchen exemestane (AROMASIN) 25 MG tablet Take 1 tablet (25 mg total) by mouth daily after breakfast. 90 tablet 0  . lidocaine-prilocaine (EMLA) cream APPLY TO AFFECTED AREA ONCE 30 g 3  . loratadine (CLARITIN) 10 MG tablet Take 10 mg by mouth daily as needed for allergies.     . Multiple Vitamin (MULTI-VITAMINS) TABS Take 1 tablet by mouth daily.     . silver sulfADIAZINE (SILVADENE) 1 % cream Apply 1 application topically 2 (two) times daily. (Patient not taking: Reported on 05/14/2018) 50 g 2   No current facility-administered medications for this visit.    Facility-Administered Medications Ordered in Other Visits  Medication Dose Route Frequency Provider Last Rate Last Dose  . heparin lock flush 100 unit/mL  500 Units Intravenous Once [name], MD      . sodium chloride flush (NS) 0.9 % injection 10 mL  10 mL Intravenous PRN [name], MD   10 mL at 08/01/17 0829     PHYSICAL EXAMINATION: ECOG PERFORMANCE STATUS: 1 - Symptomatic but completely ambulatory Vitals:   10/16/18 1015  BP: (!) 141/96  Pulse: 82  Resp: 18  Temp: 98.6 F (37 C)   Filed Weights   10/16/18 1015  Weight: 169 lb 14.4 oz (77.1 kg)    Physical Exam  Constitutional: She is oriented to person, place, and time. No distress.  HENT:  Head: Normocephalic and atraumatic.  Nose: Nose normal.  Mouth/Throat: Oropharynx is clear and moist. No oropharyngeal exudate.  Eyes: Pupils are equal, round, and reactive to light. EOM are normal. No scleral icterus.  Neck: Normal range of motion. Neck supple. No JVD present.  Cardiovascular: Normal rate, regular rhythm, normal heart sounds and intact distal pulses. Exam reveals no friction rub.  No murmur  heard. Pulmonary/Chest: Effort normal and breath sounds normal. No respiratory distress.  Abdominal: Soft. Bowel sounds are normal. She exhibits no distension.  Musculoskeletal: Normal range of motion.        General: No tenderness, deformity or edema.  Lymphadenopathy:    She has no cervical adenopathy.  Neurological: She is alert and oriented to person, place, and time. No cranial nerve deficit. She  exhibits normal muscle tone. Coordination normal.  Skin: Skin is warm and dry. She is not diaphoretic. No erythema.  Psychiatric: Affect normal.  Breast exam was performed in seated and lying down position. Patient is status post right breast lumpectomy with a well-healed surgical scar. No evidence of any right palpable masses. No palpable right axillary adenopathy. No palpable masses or lumps in the left breast. No evidence of left axillary adenopathy    Lab Results  Component Value Date   WBC 6.0 10/16/2018   HGB 13.9 10/16/2018   HCT 41.2 10/16/2018   MCV 87.5 10/16/2018   PLT 265 10/16/2018   Recent Labs    05/14/18 1336 07/16/18 1418 10/16/18 0948  NA 138 140 137  K 4.4 3.9 4.0  CL 104 104 103  CO2 '30 26 28  ' GLUCOSE 100* 96 92  BUN '14 17 14  ' CREATININE 0.59 0.52 0.57  CALCIUM 9.1 9.3 9.2  GFRNONAA >60 >60 >60  GFRAA >60 >60 >60  PROT 7.4 7.6 7.5  ALBUMIN 4.1 4.3 4.4  AST '20 20 18  ' ALT '16 16 11  ' ALKPHOS 63 62 69  BILITOT 0.4 0.4 0.5    2D Echo was done which showed LVEF 55-65%, mild mitral valve regurgitation.  12/29/2017  Pathology Surgical Pathology  CASE: [id number]  DIAGNOSIS:  A. BREAST MASS, RIGHT; NEEDLE LOCALIZED LUMPECTOMY:  - RESIDUAL INVASIVE MAMMARY CARCINOMA, STATUS POST NEOADJUVANT CHEMOTHERAPY.  - DUCTAL CARCINOMA IN SITU.  - SEE CANCER SUMMARY BELOW.  - PRIOR BIOPSY SITE CHANGE WITH CLIP.   B. SENTINEL LYMPH NODE 1, RIGHT; EXCISION:  - ONE LYMPH NODE WITH FOCUS SUSPICIOUS FOR ISOLATED TUMOR CELLS.  - TWO LYMPH NODES NEGATIVE FOR  MALIGNANCY.  - SEE COMMENT.   C. SENTINEL LYMPH NODE 2, RIGHT; EXCISION:  - ONE LYMPH NODE NEGATIVE FOR MALIGNANCY (0/1).   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  Margins:  Invasive Carcinoma Margins: Uninvolved by invasive carcinoma  Distance from closest margin: 2 mm  Specify closest margin: Superior  DCIS Margins: Uninvolved by DCIS  Distance from closest margin: 3 mm  Specify closest margin: Superior  Regional Lymph Nodes: Involved by tumor cells       Number of Lymph Nodes with Macrometastases (>2 mm): 0       Number of Lymph Nodes with Micrometastases (>0.2 mm to 2 mm  or >200 cells): 0  Number of Lymph Nodes with Isolated Tumor Cells (=0.2 mm or =200 cells): 1  Size of Largest Metastatic Deposit: Less than 0.2 mm  Extranodal Extension: Not identified  Number of Lymph Nodes Examined: 4  Number of Sentinel Nodes Examined: 4  Treatment Effect:  Treatment Effect in the Breast: Definite response to presurgical therapy in the invasive carcinoma  Treatment Effect in the Lymph Nodes: Suspicious for isolated tumor cells, no prominent fibrous scarring in the nodes  Residual Cancer Burden (RCB) from MD [facility] calculator (website below):  Primary Tumor Bed  Primary tumor bed: 44 mm x 22 mm  Overall cancer cellularity: 10 %  Percentage of cancer that is in situ: 1 %  Lymph nodes  Number of lymph nodes  positive for metastasis: 0  Residual Cancer Burden: 1.695  Residual Cancer Burden Class: RCB-II  [email or web address]   Lymphovascular Invasion: Present  Pathologic Stage Classification (pTNM, AJCC 8th Edition): ypT2 ypN0 (i+) (sn)  TNM Descriptors: y - neoadjuvant   BREAST BIOMARKER TESTS - performed on prior biopsy  Estrogen Receptor (ER) Status: POSITIVE, >90% nuclear staining  Progesterone Receptor (PgR) Status: POSITIVE, >90% nuclear staining  HER2 (by immunohistochemistry): EQUIVOCAL  HER2 (ERBB2) (by in situ hybridization): NEGATIVE    RADIOGRAPHIC STUDIES: I have personally reviewed the radiological images as listed and agreed with the findings in the report. 06/22/2018  Diagnostic mammogram bilateral no evidence of malignancy within either breast.  ASSESSMENT & PLAN:  1. Aromatase inhibitor use   2. Malignant neoplasm of upper-outer quadrant of right breast in female, estrogen receptor positive ([place])   3. Port-A-Cath in place   4. Vasomotor symptoms due to menopause   Cancer Staging Malignant neoplasm of upper-outer quadrant of right breast in female, estrogen receptor positive ([place]) Staging form: Breast, AJCC 8th Edition - Clinical stage from 07/15/2017: Stage IB (cT2, cN0, cM0, G2, ER: Positive, PR: Positive, HER2: Negative) - Signed by [name], MD on 07/15/2017 #ypT2 ypN0 (i+) (sn), grade 2 breast cancer, ER/PR positive, HER 2 negative. Clinically patient is doing well.  She had diagnostic mammogram done in December 2019. Labs are reviewed and discussed with patient. Okay to proceed with Zoladex today.  Continue Aromasin.  # Porta Cath in place, continue port flush every 6 to 8 weeks. #Chronic aromatase inhibitor use: Recommend checking baseline DEXA Recommend adjuvant bisphosphonate with Zometa every 6 months for 2 years reduce skeletal events in cancer patients, prevent recurrences to the bone, and improve survival Side effects of Zometa include but not limited to hypocalcemia, bone necrosis, etc. Recommend patient to obtain dental clearance prior to starting bisphosphonate treatment   #Hyperlipidemia, LDL profile was obtained.  Triglyceride may be elevated due to not a fasting state.   Elevated total cholesterol, decreased HDL, increased LDL and VLDL.  We have discussed during our previous visit about patient to follow-up with primary care physician for further discussion.  We spent sufficient time to discuss many aspect of care, questions were answered to patient's satisfaction. The patient knows to call the clinic with any problems questions or concerns.  Return visit: She will follow-up monthly for Zoladex injections, obtain CBC, CMP and follow-up in the clinic in 3 months   [name], MD, PhD Hematology [place] at [facility] Pager- [phone] 10/16/2018

## 2018-11-13 ENCOUNTER — Inpatient Hospital Stay: Payer: 59 | Attending: Oncology

## 2018-11-13 ENCOUNTER — Inpatient Hospital Stay: Payer: 59

## 2018-11-13 ENCOUNTER — Other Ambulatory Visit: Payer: Self-pay

## 2018-11-13 DIAGNOSIS — C50411 Malignant neoplasm of upper-outer quadrant of right female breast: Secondary | ICD-10-CM | POA: Diagnosis present

## 2018-11-13 DIAGNOSIS — Z170 Estrogen receptor positive status [ER+]: Secondary | ICD-10-CM | POA: Diagnosis not present

## 2018-11-13 DIAGNOSIS — Z5111 Encounter for antineoplastic chemotherapy: Secondary | ICD-10-CM | POA: Insufficient documentation

## 2018-11-13 DIAGNOSIS — Z95828 Presence of other vascular implants and grafts: Secondary | ICD-10-CM

## 2018-11-13 MED ORDER — HEPARIN SOD (PORK) LOCK FLUSH 100 UNIT/ML IV SOLN
500.0000 [IU] | Freq: Once | INTRAVENOUS | Status: AC
  Administered 2018-11-13: 500 [IU] via INTRAVENOUS

## 2018-11-13 MED ORDER — GOSERELIN ACETATE 3.6 MG ~~LOC~~ IMPL
3.6000 mg | DRUG_IMPLANT | Freq: Once | SUBCUTANEOUS | Status: AC
  Administered 2018-11-13: 3.6 mg via SUBCUTANEOUS
  Filled 2018-11-13: qty 3.6

## 2018-11-13 MED ORDER — SODIUM CHLORIDE 0.9% FLUSH
10.0000 mL | Freq: Once | INTRAVENOUS | Status: AC
  Administered 2018-11-13: 10 mL via INTRAVENOUS
  Filled 2018-11-13: qty 10

## 2018-11-18 ENCOUNTER — Ambulatory Visit: Payer: 59

## 2018-11-27 IMAGING — MG DIGITAL DIAGNOSTIC UNILATERAL RIGHT MAMMOGRAM WITH TOMO AND CAD
6 series · 6 of 18 positions shown · non-contrast
Comparison: Previous exam(s).

CLINICAL DATA: A patient with right breast 10 o'clock known
invasive mammary carcinoma, post neoadjuvant chemotherapy.

EXAM:
DIGITAL DIAGNOSTIC RIGHT MAMMOGRAM WITH CAD AND TOMO
ULTRASOUND RIGHT BREAST

[R XCCL synth-2D]
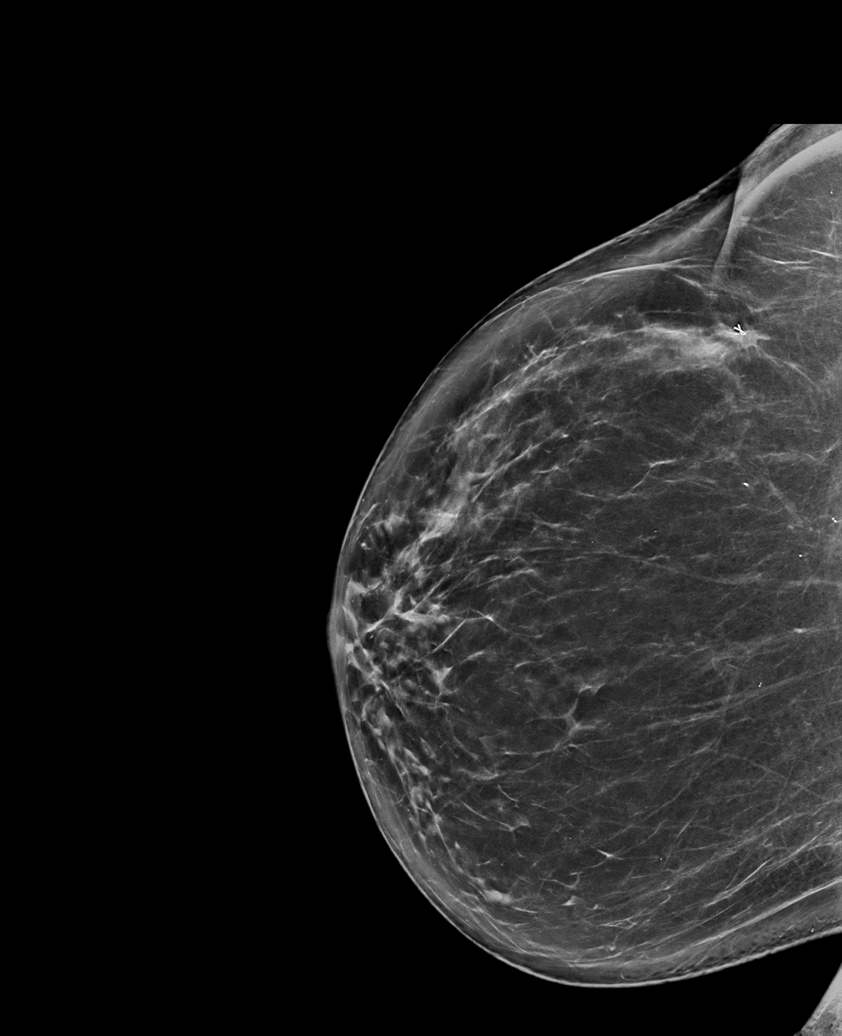

[R MLO synth-2D]
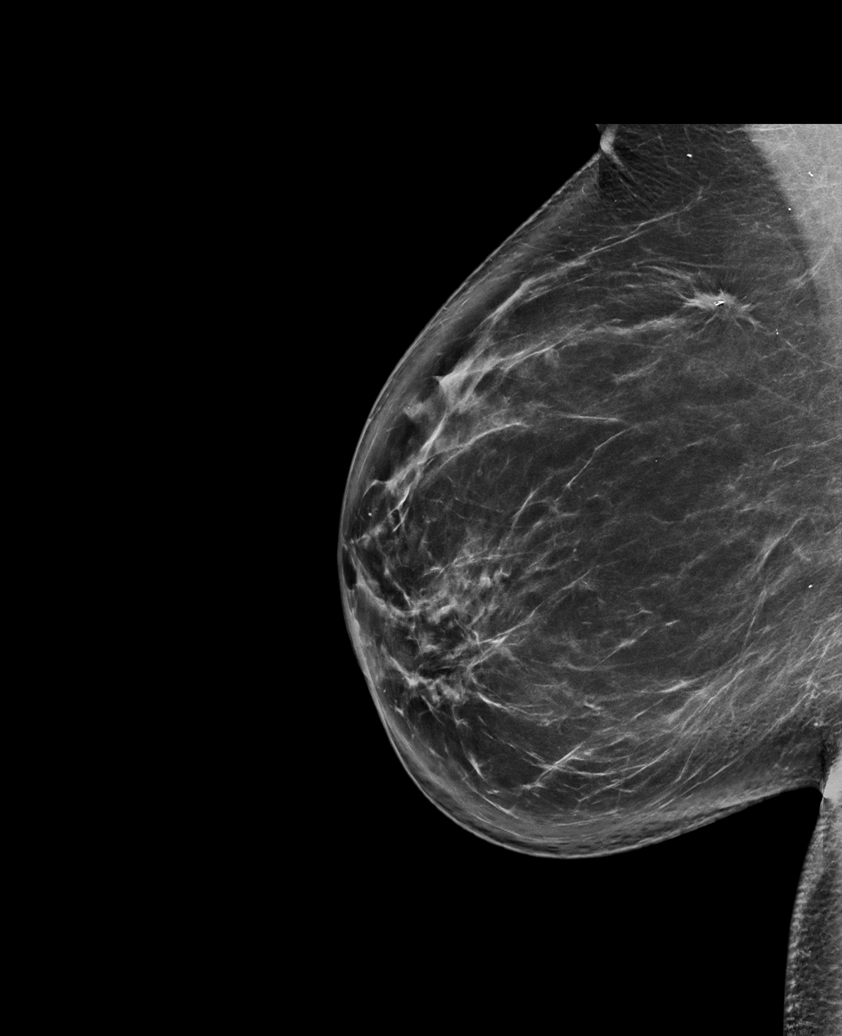

[R CC synth-2D]
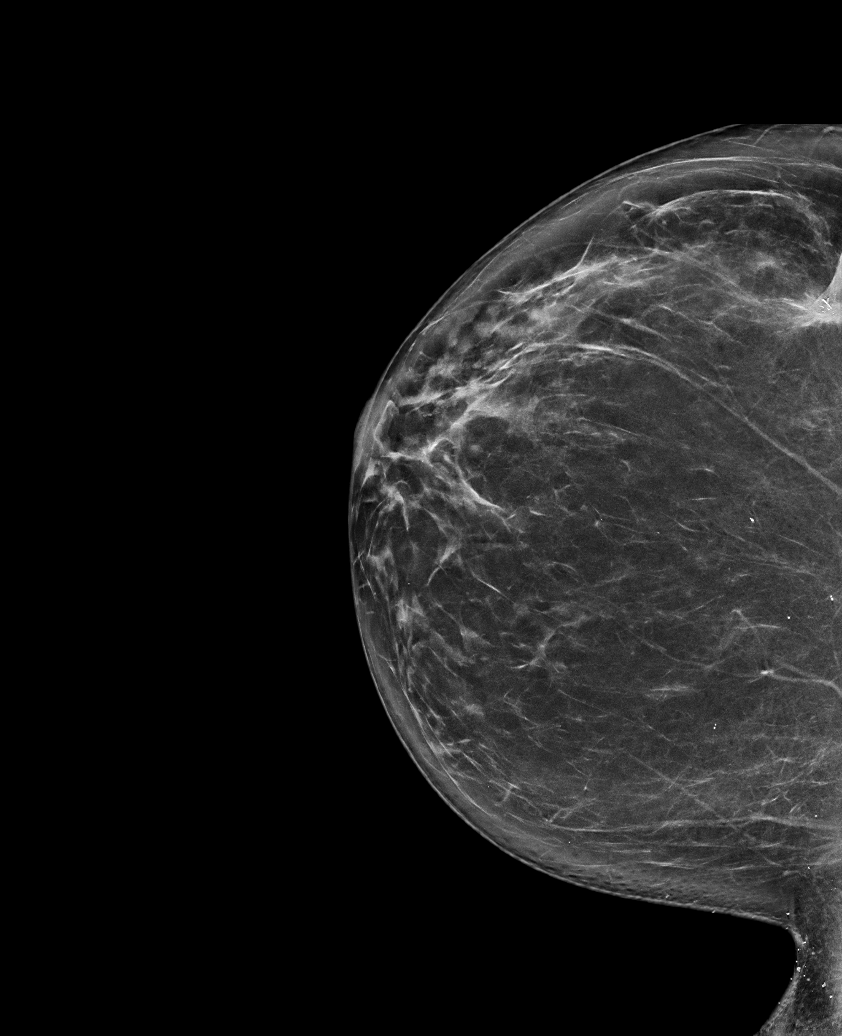

[R CC tomo · tomo slice 41/80.0]
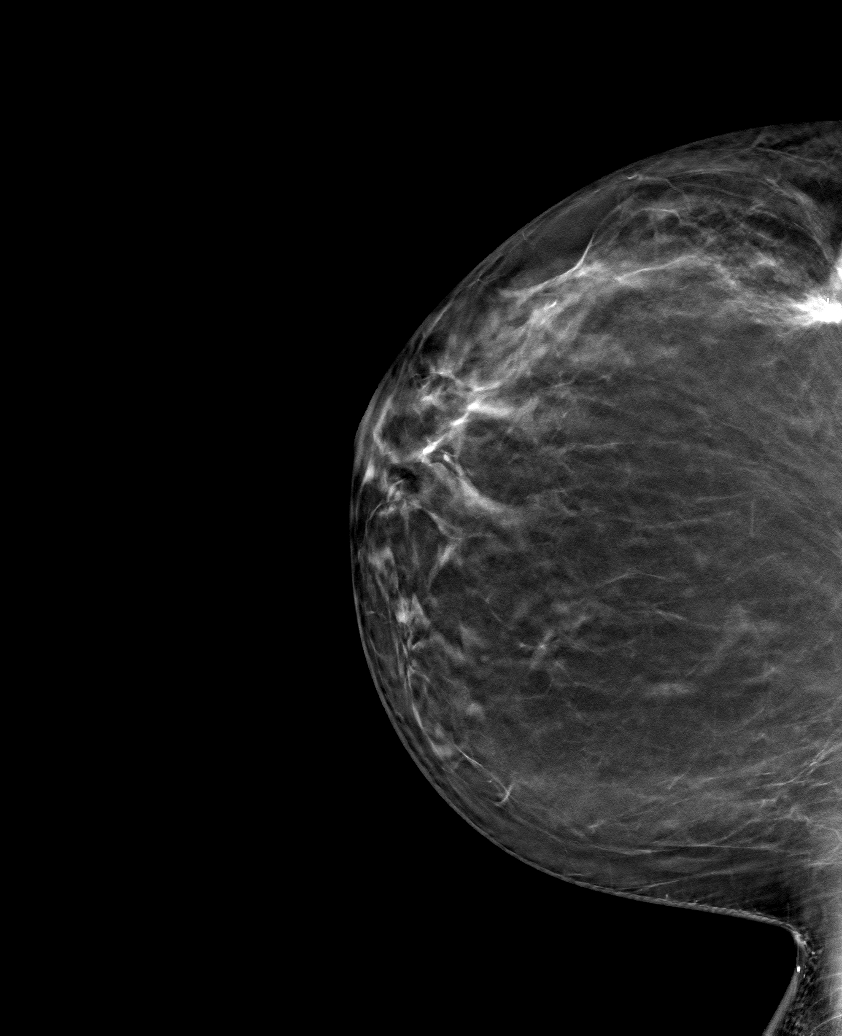

[R MLO tomo · tomo slice 47/92.0]
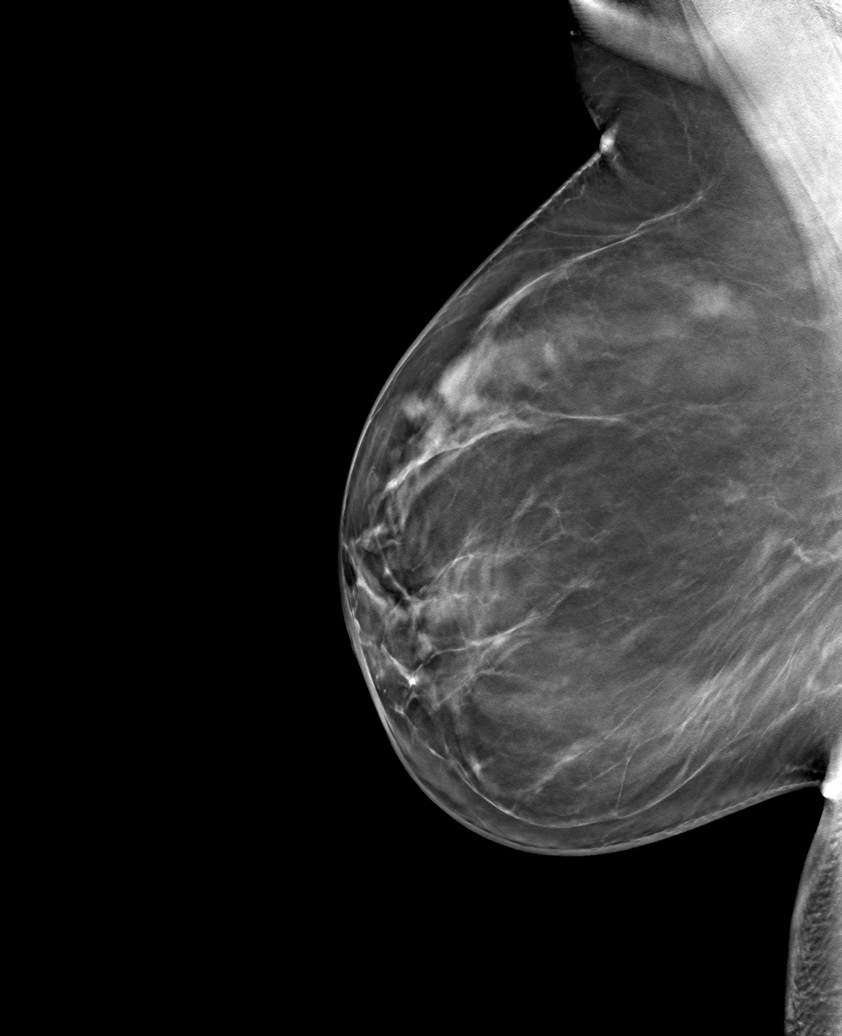

[R XCCL tomo · tomo slice 42/83.0]
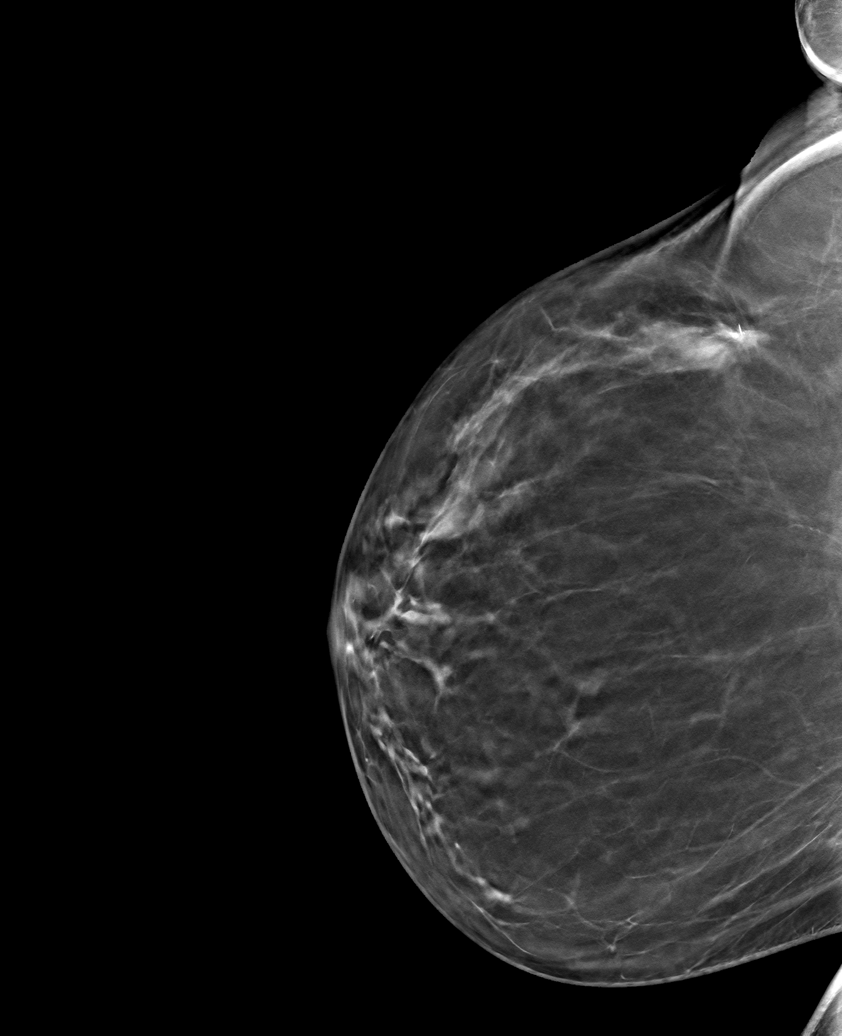

[6 of 18 positions shown; findings below may reference images not displayed]

ACR Breast Density Category b: There are scattered areas of
fibroglandular density.
FINDINGS: The known right breast 10 o'clock, posterior depth, spiculated mass
has a less masslike appearance mammographically, but is grossly
stable in size.

Mammographic images were processed with CAD.

Targeted ultrasound is performed, showing right breast 10 o'clock 10
cm from the nipple hypoechoic irregular mass measuring 1.2 x 0.7 x
1.4 cm. This mass previously measured 1.3 x 1.0 by 1.3 cm,
09/08/2017.
IMPRESSION: Slight decrease in size in the known right breast 10 o'clock
invasive mammary carcinoma.

RECOMMENDATION:
Continue with treatment plan.

I have discussed the findings and recommendations with the patient.
Results were also provided in writing at the conclusion of the
visit. If applicable, a reminder letter will be sent to the patient
regarding the next appointment.

BI-RADS CATEGORY  6: Known biopsy-proven malignancy.

## 2018-11-29 ENCOUNTER — Other Ambulatory Visit: Payer: Self-pay | Admitting: Oncology

## 2018-12-17 ENCOUNTER — Other Ambulatory Visit: Payer: Self-pay

## 2018-12-17 ENCOUNTER — Inpatient Hospital Stay: Payer: 59 | Attending: Oncology

## 2018-12-17 ENCOUNTER — Inpatient Hospital Stay: Payer: 59

## 2018-12-17 DIAGNOSIS — C50411 Malignant neoplasm of upper-outer quadrant of right female breast: Secondary | ICD-10-CM | POA: Diagnosis not present

## 2018-12-17 DIAGNOSIS — Z79899 Other long term (current) drug therapy: Secondary | ICD-10-CM | POA: Diagnosis not present

## 2018-12-17 DIAGNOSIS — Z170 Estrogen receptor positive status [ER+]: Secondary | ICD-10-CM | POA: Diagnosis not present

## 2018-12-17 DIAGNOSIS — Z95828 Presence of other vascular implants and grafts: Secondary | ICD-10-CM

## 2018-12-17 MED ORDER — HEPARIN SOD (PORK) LOCK FLUSH 100 UNIT/ML IV SOLN
500.0000 [IU] | Freq: Once | INTRAVENOUS | Status: AC
  Administered 2018-12-17: 14:00:00 500 [IU] via INTRAVENOUS

## 2018-12-17 MED ORDER — GOSERELIN ACETATE 3.6 MG ~~LOC~~ IMPL
3.6000 mg | DRUG_IMPLANT | Freq: Once | SUBCUTANEOUS | Status: AC
  Administered 2018-12-17: 3.6 mg via SUBCUTANEOUS
  Filled 2018-12-17: qty 3.6

## 2018-12-17 MED ORDER — SODIUM CHLORIDE 0.9% FLUSH
10.0000 mL | Freq: Once | INTRAVENOUS | Status: AC
  Administered 2018-12-17: 14:00:00 10 mL via INTRAVENOUS
  Filled 2018-12-17: qty 10

## 2018-12-18 ENCOUNTER — Inpatient Hospital Stay: Payer: 59

## 2019-01-08 IMAGING — CT CT CHEST W/ CM
2 of 5 series · 12 of 36 positions shown, 15 images · IV contrast (iopamidol)
Comparison: None.

CLINICAL DATA: Staging breast cancer. Initial diagnosis June 2017. Status post surgery and chemotherapy.

EXAM:
CT CHEST, ABDOMEN, AND PELVIS WITH CONTRAST
TECHNIQUE: Multidetector CT imaging of the chest, abdomen and pelvis was
performed following the standard protocol during bolus
administration of intravenous contrast.
CONTRAST:  100mL 4HNU2T-A22 IOPAMIDOL (4HNU2T-A22) INJECTION 61%

[Series 2: axials cap · axial · 0.81mm/px · z∈[-1535,-1005]mm · 9 of 134 slices shown, 12 images]
[im 14/134  mediastinal]
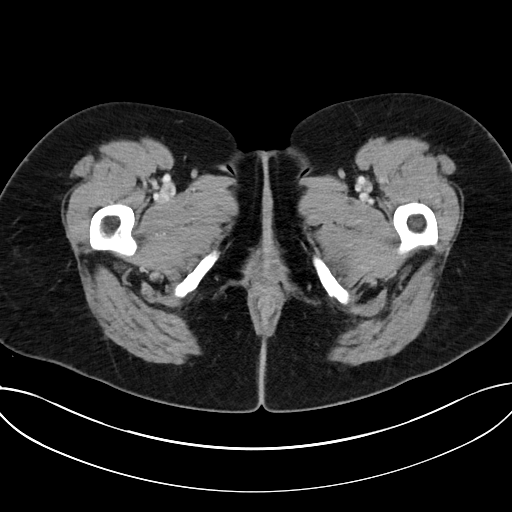
[im 14/134  lung]
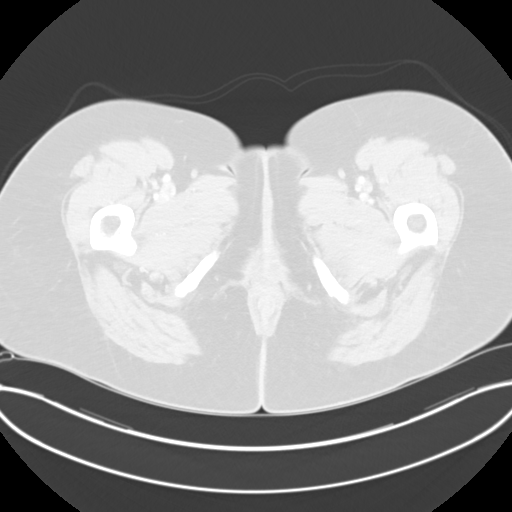
[im 27/134  lung]
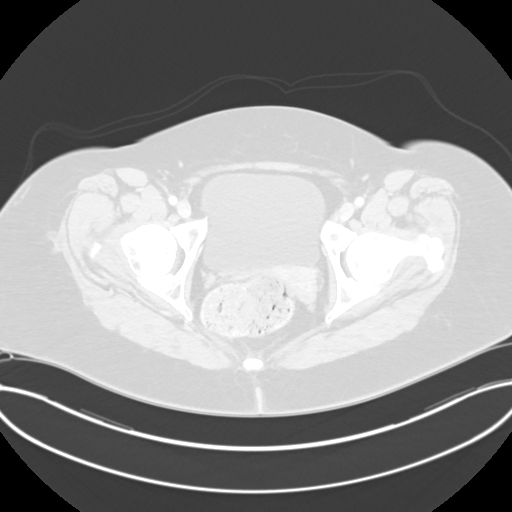
[im 40/134  lung]
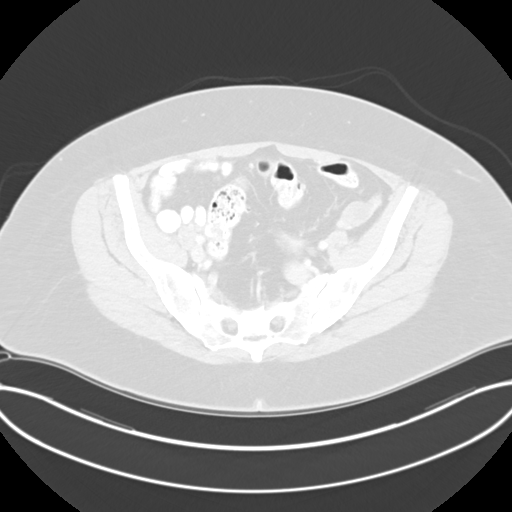
[im 54/134  lung]
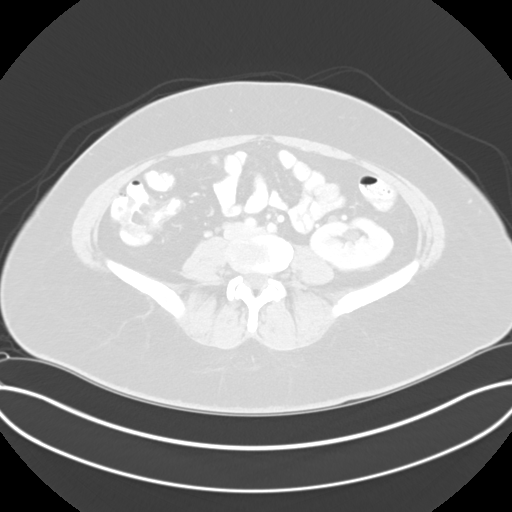
[im 67/134  mediastinal]
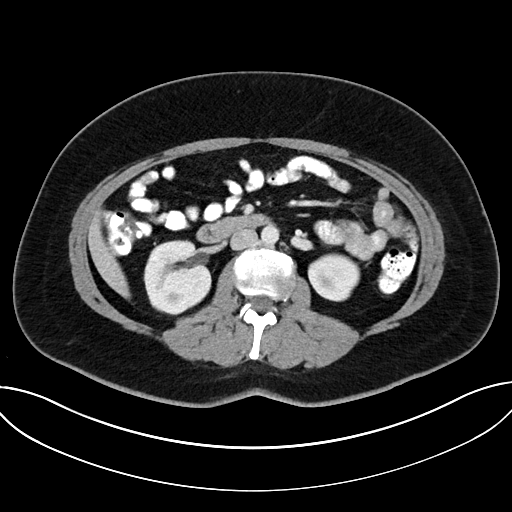
[im 67/134  lung]
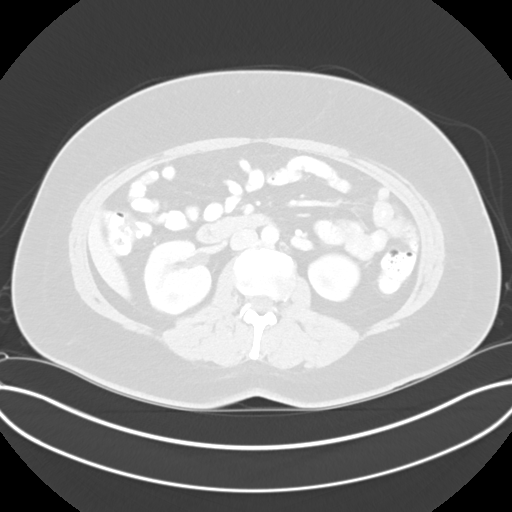
[im 80/134  lung]
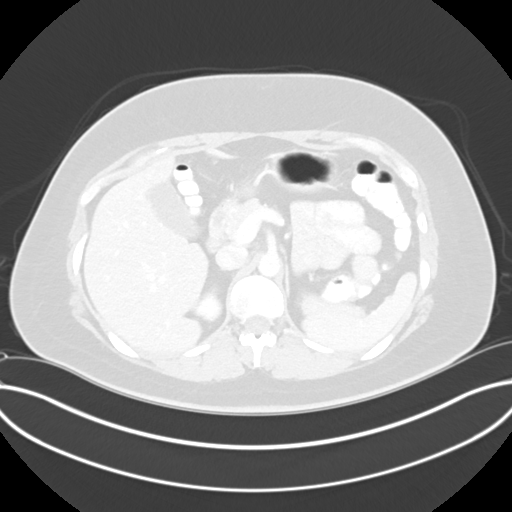
[im 94/134  lung]
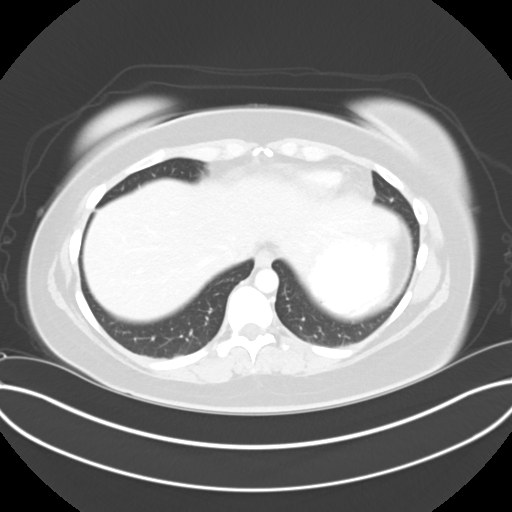
[im 107/134  lung]
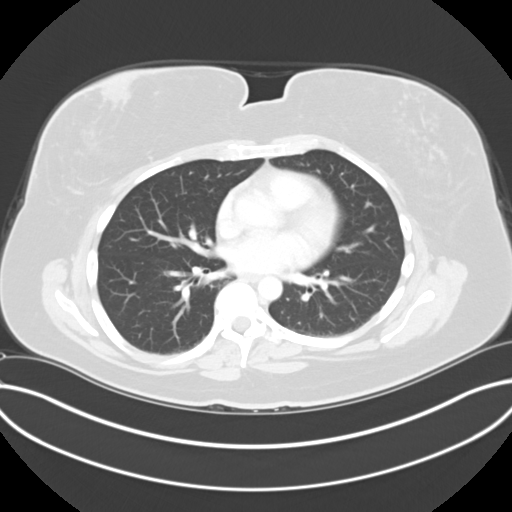
[im 120/134  mediastinal]
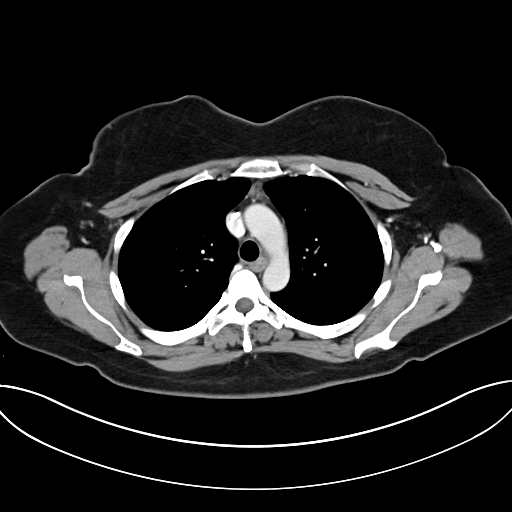
[im 120/134  lung]
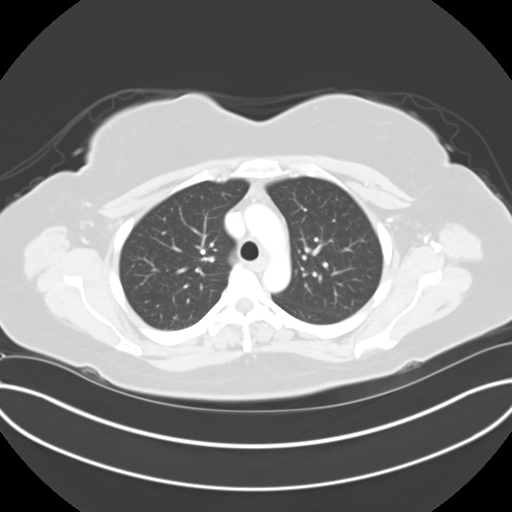

[Series 4: coronals cap · coronal · 0.81mm/px · 3 of 147 slices shown]
[im 30/147  lung]
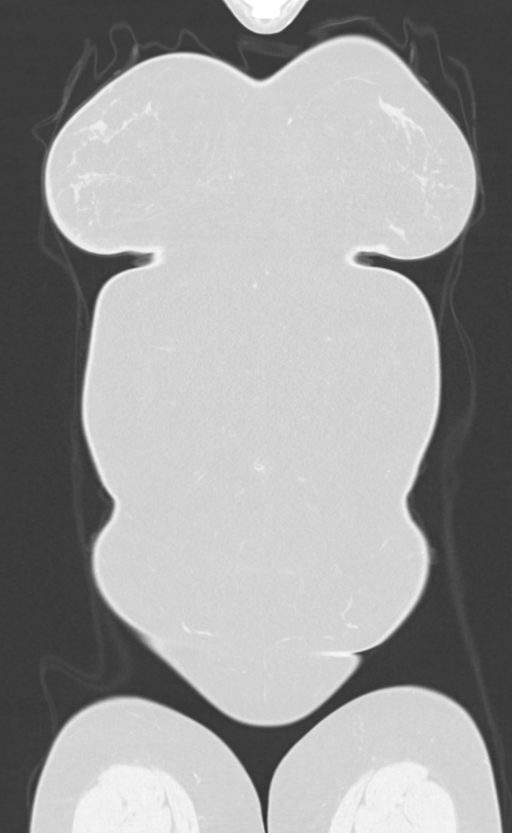
[im 59/147  lung]
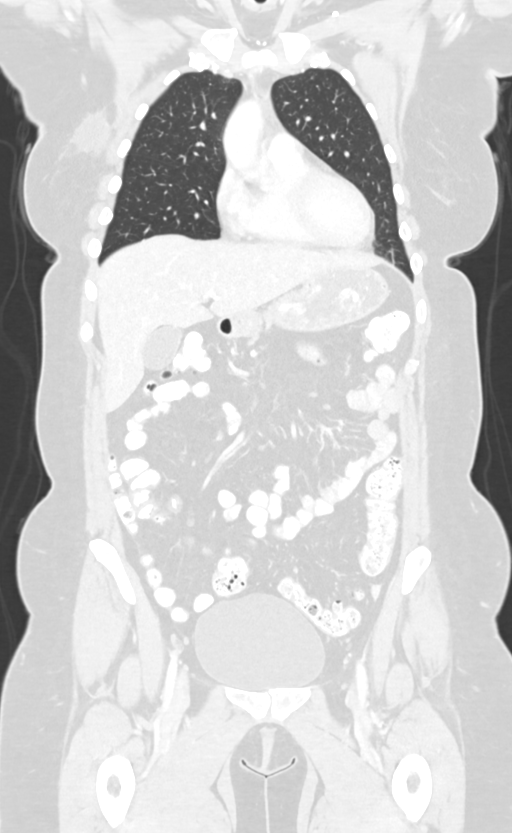
[im 88/147  lung]
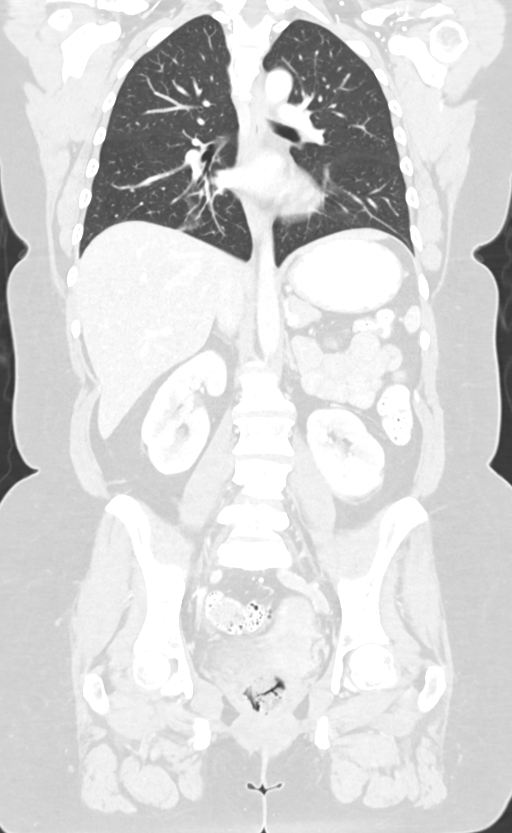

[12 of 36 positions shown; findings below may reference images not displayed]

FINDINGS: CT CHEST FINDINGS

Cardiovascular: The heart is normal in size. No pericardial
effusion. The aorta is normal in caliber. No dissection. No
atherosclerotic calcifications. The branch vessels are patent. No
definite coronary artery calcifications.

Mediastinum/Nodes: No mediastinal or hilar mass or lymphadenopathy.
The esophagus is grossly normal.

Lungs/Pleura: No acute pulmonary findings. No worrisome pulmonary
lesions or pulmonary nodules to suggest metastatic disease. No
pleural effusion or pleural nodules.

Chest wall/musculoskeletal: Surgical changes involving the right
lateral breast with a postoperative seroma or liquified hematoma. No
worrisome breast lesions. No enlarged supraclavicular, axillary or
subpectoral lymph nodes. Surgical scarring changes assumed in the
right axilla.

No lytic or sclerotic bone lesions to suggest metastatic disease.

Left-sided Port-A-Cath in good position without complicating
features.

CT ABDOMEN PELVIS FINDINGS

Hepatobiliary: No focal hepatic lesions or intrahepatic biliary
dilatation. The gallbladder appears normal. No common bile duct
dilatation.

Pancreas: No mass, inflammation or ductal dilatation.

Spleen: Normal size.  No focal lesions.

Adrenals/Urinary Tract: Adrenal glands are normal. Both kidneys are
unremarkable. The left kidney and left renal artery are low lying in
the lower abdomen/upper pelvis and there is a mild rotational
abnormality. No renal or bladder calculi. No bladder mass.

Stomach/Bowel: The stomach, duodenum, small bowel and colon are
unremarkable. No acute inflammatory changes, mass lesions or
obstructive findings. Scattered colonic diverticulosis. The terminal
ileum and appendix are normal.

Vascular/Lymphatic: No mesenteric or retroperitoneal mass or
adenopathy. The aorta and branch vessels are normal. The major
venous structures are normal.

Reproductive: There is a 2 cm lesion in the posterior myometrium
which is likely a benign fibroid. Ultrasound correlation may be
helpful. Both ovaries appear normal.

Other: No pelvic mass or adenopathy. No free pelvic fluid
collections. No inguinal mass or adenopathy. No abdominal wall
hernia or subcutaneous lesions.

Musculoskeletal: No worrisome lytic or sclerotic bone lesions to
suggest metastatic disease.
IMPRESSION: 1. Postoperative changes involving the right breast and axilla
without findings suspicious for residual tumor or adenopathy.
2. No findings suspicious for metastatic disease involving the
chest, abdomen/pelvis or bony structures.
3. 2 cm uterine lesion, most likely benign fibroid.

## 2019-01-14 ENCOUNTER — Telehealth: Payer: Self-pay

## 2019-01-14 ENCOUNTER — Encounter: Payer: Self-pay | Admitting: Oncology

## 2019-01-14 NOTE — Telephone Encounter (Signed)
Dental clearance request for zometa, sent to [facility].

## 2019-01-18 ENCOUNTER — Inpatient Hospital Stay: Payer: 59

## 2019-01-19 ENCOUNTER — Inpatient Hospital Stay (HOSPITAL_BASED_OUTPATIENT_CLINIC_OR_DEPARTMENT_OTHER): Payer: 59 | Admitting: Oncology

## 2019-01-19 ENCOUNTER — Other Ambulatory Visit: Payer: Self-pay

## 2019-01-19 ENCOUNTER — Inpatient Hospital Stay: Payer: 59 | Attending: Oncology

## 2019-01-19 ENCOUNTER — Encounter: Payer: Self-pay | Admitting: Oncology

## 2019-01-19 ENCOUNTER — Inpatient Hospital Stay: Payer: 59

## 2019-01-19 VITALS — BP 116/81 | HR 85 | Temp 98.0°F | Resp 18 | Wt 164.7 lb

## 2019-01-19 DIAGNOSIS — Z170 Estrogen receptor positive status [ER+]: Secondary | ICD-10-CM | POA: Insufficient documentation

## 2019-01-19 DIAGNOSIS — Z5111 Encounter for antineoplastic chemotherapy: Secondary | ICD-10-CM | POA: Insufficient documentation

## 2019-01-19 DIAGNOSIS — J45909 Unspecified asthma, uncomplicated: Secondary | ICD-10-CM | POA: Diagnosis not present

## 2019-01-19 DIAGNOSIS — C50411 Malignant neoplasm of upper-outer quadrant of right female breast: Secondary | ICD-10-CM | POA: Insufficient documentation

## 2019-01-19 DIAGNOSIS — N951 Menopausal and female climacteric states: Secondary | ICD-10-CM | POA: Diagnosis not present

## 2019-01-19 DIAGNOSIS — Z9221 Personal history of antineoplastic chemotherapy: Secondary | ICD-10-CM | POA: Insufficient documentation

## 2019-01-19 DIAGNOSIS — Z79899 Other long term (current) drug therapy: Secondary | ICD-10-CM | POA: Insufficient documentation

## 2019-01-19 DIAGNOSIS — Z79811 Long term (current) use of aromatase inhibitors: Secondary | ICD-10-CM | POA: Insufficient documentation

## 2019-01-19 DIAGNOSIS — Z95828 Presence of other vascular implants and grafts: Secondary | ICD-10-CM

## 2019-01-19 DIAGNOSIS — Z803 Family history of malignant neoplasm of breast: Secondary | ICD-10-CM | POA: Diagnosis not present

## 2019-01-19 DIAGNOSIS — E2839 Other primary ovarian failure: Secondary | ICD-10-CM

## 2019-01-19 LAB — CBC WITH DIFFERENTIAL/PLATELET
Abs Immature Granulocytes: 0.01 K/uL (ref 0.00–0.07)
Basophils Absolute: 0 K/uL (ref 0.0–0.1)
Basophils Relative: 1 %
Eosinophils Absolute: 0.1 K/uL (ref 0.0–0.5)
Eosinophils Relative: 1 %
HCT: 39.4 % (ref 36.0–46.0)
Hemoglobin: 13.3 g/dL (ref 12.0–15.0)
Immature Granulocytes: 0 %
Lymphocytes Relative: 27 %
Lymphs Abs: 1.6 K/uL (ref 0.7–4.0)
MCH: 29.8 pg (ref 26.0–34.0)
MCHC: 33.8 g/dL (ref 30.0–36.0)
MCV: 88.3 fL (ref 80.0–100.0)
Monocytes Absolute: 0.4 K/uL (ref 0.1–1.0)
Monocytes Relative: 7 %
Neutro Abs: 4 K/uL (ref 1.7–7.7)
Neutrophils Relative %: 64 %
Platelets: 255 K/uL (ref 150–400)
RBC: 4.46 MIL/uL (ref 3.87–5.11)
RDW: 10.9 % — ABNORMAL LOW (ref 11.5–15.5)
WBC: 6.1 K/uL (ref 4.0–10.5)
nRBC: 0 % (ref 0.0–0.2)

## 2019-01-19 LAB — COMPREHENSIVE METABOLIC PANEL
ALT: 12 U/L (ref 0–44)
AST: 18 U/L (ref 15–41)
Albumin: 4.5 g/dL (ref 3.5–5.0)
Alkaline Phosphatase: 71 U/L (ref 38–126)
Anion gap: 9 (ref 5–15)
BUN: 15 mg/dL (ref 6–20)
CO2: 28 mmol/L (ref 22–32)
Calcium: 9.4 mg/dL (ref 8.9–10.3)
Chloride: 101 mmol/L (ref 98–111)
Creatinine, Ser: 0.53 mg/dL (ref 0.44–1.00)
GFR calc Af Amer: >60 mL/min (ref >60)
GFR calc non Af Amer: >60 mL/min (ref >60)
Glucose, Bld: 97 mg/dL (ref 70–99)
Potassium: 4 mmol/L (ref 3.5–5.1)
Sodium: 138 mmol/L (ref 135–145)
Total Bilirubin: 0.6 mg/dL (ref 0.3–1.2)
Total Protein: 7.6 g/dL (ref 6.5–8.1)

## 2019-01-19 MED ORDER — HEPARIN SOD (PORK) LOCK FLUSH 100 UNIT/ML IV SOLN
500.0000 [IU] | Freq: Once | INTRAVENOUS | Status: AC
  Administered 2019-01-19: 500 [IU] via INTRAVENOUS

## 2019-01-19 MED ORDER — GOSERELIN ACETATE 3.6 MG ~~LOC~~ IMPL
3.6000 mg | DRUG_IMPLANT | Freq: Once | SUBCUTANEOUS | Status: AC
  Administered 2019-01-19: 3.6 mg via SUBCUTANEOUS
  Filled 2019-01-19: qty 3.6

## 2019-01-19 MED ORDER — SODIUM CHLORIDE 0.9% FLUSH
10.0000 mL | Freq: Once | INTRAVENOUS | Status: AC
  Administered 2019-01-19: 11:00:00 10 mL via INTRAVENOUS
  Filled 2019-01-19: qty 10

## 2019-01-19 NOTE — Progress Notes (Signed)
Hematology/Oncology Follow up note [facility] Telephone:([phone]) [phone] Fax:(336) [phone]   Patient Care Team: [name], NP as PCP - General (Nurse Practitioner) Theodore Demark, RN as Oncology Nurse Navigator [name], [name], MD as Referring Physician (Surgery) [name], MD as Consulting Physician (Oncology) [name], MD as Consulting Physician (General Surgery) [name], MD as Referring Physician (Radiation Oncology)  REASON FOR VISIT Follow up for management of breast cancer   HISTORY OF PRESENTING ILLNESS:  Anne Wu is a  38 y.o.  female with diagnosis of cT2N0M0, grade 2 invasive mammary carcinoma.  She has significant family history on both maternal side as well as paternal side. She felt a right breast mass 2 months ago and diagnostic mammogram showed right upper quandrant 2.2 cm mass, which was confirmed on [place] and biopsied. Right axillary no clinically suspicious lymph node. Pathology showed invasive mammary carcinoma, ER/PR positive,, HER2 IHC  equivocal, FISH negative.   # Patient was evaluated by Dr.Smith. From surgical standpoint, Dr.Smith recommend neoadjuvant chemotherapy to facilitate surgery to obtain negative margin and better surgical outcome. Also given patient's young age, neoadjuvant chemotherapy is reasonable.    #Patient stated that she does not desire future fertility.  # 2D Echo showed normal systolic function, mild mitral regurgitation.  # CT was done  to rule out metastatic disease given micrometastasis. Images were independently reviewed by me and discussed with patient.  Negative for metastatic disease. # Testing did not reveal any pathogenic mutation in any of these genes. A copy of the genetic test report will be scanned into Epic under the Media tab.The genes analyzed were the 23 genes on [facility]'s Breast/GYN panel (ATM, BARD1, BRCA1, BRCA2, BRIP1, CDH1, CHEK2, DICER1, EPCAM, MLH1,  MSH2, MSH6,  NBN, NF1, PALB2, PMS2, PTEN, RAD50, RAD51C, RAD51D,SMARCA4, STK11, and TP53). 09/08/2017 Interval mammogram after 4 cycle of AC, showed good treatment response. The biopsy-proven malignancy in the upper outer quadrant of the right breast at posterior depth, associated with scattered microcalcifications and architectural distortion, has significantly decreased in size since the mammogram 06/20/2017. On today's mammogram, the mass measures approximately 1.7 x 2.2 x 1.2 cm (previously 2.4 x 1.6 x 2.7 cm).   Treatment Summary :  Jan 2019- May 2019 Neoadjuvant AC-->T  12/29/2017 Right lumpectomy and sentinel LN biopsy Pathology showed ypT2 ypN0 (i+) (sn) case was discussed on tumor board.ER/PR positive, HER 2 negative.  Aug 2019-September 2019 Adjuvant RT  Oct 2019 to start Zoladex and Aromasin.   INTERVAL HISTORY Anne Wu is a 38 y.o. female who has above history reviewed by me presents for follow up for breast cancer management  Patient takes Aromasin daily.  Tolerates well. Manageable/and joint pain. She is also on ovarian suppression with Zoladex monthly.  Tolerating well.  Patient has had dental work done recently.  She is good to see dentist next week. Denies any new complaints. She works in a [employer]. Denies any fever, chills, cough, abdominal pain chest pain, back pain. Denies any new complaints of her breasts     Review of Systems  Constitutional: Positive for fatigue. Negative for appetite change, chills and fever.  HENT:   Negative for hearing loss and voice change.        Recent dental work.  Teeth shifting  Eyes: Negative for eye problems.  Respiratory: Negative for chest tightness and cough.   Cardiovascular: Negative for chest pain.  Gastrointestinal: Negative for abdominal distention, abdominal pain and blood in stool.  Endocrine: Positive for  hot flashes.  Genitourinary: Negative for difficulty urinating and frequency.   Musculoskeletal: Positive for  arthralgias.  Skin: Negative for itching and rash.  Neurological: Negative for dizziness, extremity weakness and light-headedness.  Hematological: Negative for adenopathy.  Psychiatric/Behavioral: Negative for confusion. The patient is not nervous/anxious.     MEDICAL HISTORY:  Past Medical History:  Diagnosis Date  . Asthma    AS A CHILD-NO INHALERS  . Breast cancer ([place]) 2018   Right breast cancer-[facility]  . Elevated blood pressure, situational    PT STATES HER LAST COUPLE OF MD APPOINTMENTS SHE HAS HAD ELEVATED BP-NEVER HAD A PROBLEM WITH THIS PREVIOUSLY  . Genetic testing 09/05/2017   Breast/GYN panel (23 genes) @ [facility] - No pathogenic mutations detected  . GERD (gastroesophageal reflux disease)    OCC- NO MEDS  . Malignant neoplasm of upper-outer quadrant of right breast in female, estrogen receptor positive ([place]) 2019   [facility] and DCIS  . Personal history of chemotherapy     SURGICAL HISTORY: Past Surgical History:  Procedure Laterality Date  . BREAST BIOPSY Right 06/20/2017   Invasive Mammary Carcinoma  . BREAST EXCISIONAL BIOPSY Right 12/29/2017   RESIDUAL INVASIVE MAMMARY CARCINOMA and DCIS  . BREAST LUMPECTOMY Right 12/29/2017   needle localization and sentinel node injection  . NO PAST SURGERIES    . PARTIAL MASTECTOMY WITH NEEDLE LOCALIZATION Right 12/29/2017   Procedure: PARTIAL MASTECTOMY WITH NEEDLE LOCALIZATION;  Surgeon: [name], MD;  Location: [facility] ORS;  Service: General;  Laterality: Right;  . PORTACATH PLACEMENT N/A 07/11/2017   Procedure: INSERTION PORT-A-CATH;  Surgeon: Leonie Green, MD;  Location: [facility] ORS;  Service: General;  Laterality: N/A;  . SENTINEL NODE BIOPSY Right 12/29/2017   Procedure: SENTINEL NODE BIOPSY;  Surgeon: [name], MD;  Location: [facility] ORS;  Service: General;  Laterality: Right;    SOCIAL HISTORY: Social History   Socioeconomic History  . Marital status: Married    Spouse name: Not on file  .  Number of children: Not on file  . Years of education: Not on file  . Highest education level: Not on file  Occupational History  . Not on file  Social Needs  . Financial resource strain: Not on file  . Food insecurity    Worry: Not on file    Inability: Not on file  . Transportation needs    Medical: Not on file    Non-medical: Not on file  Tobacco Use  . Smoking status: Never Smoker  . Smokeless tobacco: Never Used  Substance and Sexual Activity  . Alcohol use: No    Alcohol/week: 0.0 standard drinks  . Drug use: No  . Sexual activity: Yes  Lifestyle  . Physical activity    Days per week: Not on file    Minutes per session: Not on file  . Stress: Not on file  Relationships  . Social Herbalist on phone: Not on file    Gets together: Not on file    Attends religious service: Not on file    Active member of club or organization: Not on file    Attends meetings of clubs or organizations: Not on file    Relationship status: Not on file  . Intimate partner violence    Fear of current or ex partner: Not on file    Emotionally abused: Not on file    Physically abused: Not on file    Forced sexual activity: Not on file  Other Topics  Concern  . Not on file  Social History Narrative  . Not on file    FAMILY HISTORY: Family History  Problem Relation Age of Onset  . Breast cancer Maternal Aunt 30       currently late 63s  . Breast cancer Maternal Grandmother 68       second breast vs. recurrence ca at 37; deceased at 52  . Colon cancer Maternal Uncle 57       deceased in 5s  . Breast cancer Paternal Aunt        age at dx unknown; currently in 54s  . Breast cancer Paternal Grandmother        age at dx unknown  . Melanoma Mother        on leg  . Testicular cancer Maternal Uncle 89       currently 22s    ALLERGIES:  is allergic to codeine.  MEDICATIONS:  Current Outpatient Medications  Medication Sig Dispense Refill  . acetaminophen (TYLENOL) 325 MG  tablet Take 650 mg by mouth every 6 (six) hours as needed for moderate pain or headache.     . cholecalciferol (VITAMIN D) 1000 units tablet Take 1,000 Units by mouth daily.    Marland Kitchen exemestane (AROMASIN) 25 MG tablet TAKE 1 TABLET (25 MG TOTAL) BY MOUTH DAILY AFTER BREAKFAST. 90 tablet 0  . lidocaine-prilocaine (EMLA) cream APPLY TO AFFECTED AREA ONCE 30 g 3  . loratadine (CLARITIN) 10 MG tablet Take 10 mg by mouth daily as needed for allergies.     . Multiple Vitamin (MULTI-VITAMINS) TABS Take 1 tablet by mouth daily.     . silver sulfADIAZINE (SILVADENE) 1 % cream Apply 1 application topically 2 (two) times daily. (Patient not taking: Reported on 05/14/2018) 50 g 2   No current facility-administered medications for this visit.    Facility-Administered Medications Ordered in Other Visits  Medication Dose Route Frequency Provider Last Rate Last Dose  . heparin lock flush 100 unit/mL  500 Units Intravenous Once [name], MD      . sodium chloride flush (NS) 0.9 % injection 10 mL  10 mL Intravenous PRN [name], MD   10 mL at 08/01/17 0829     PHYSICAL EXAMINATION: ECOG PERFORMANCE STATUS: 1 - Symptomatic but completely ambulatory Vitals:   01/19/19 1047  BP: 116/81  Pulse: 85  Resp: 18  Temp: 98 F (36.7 C)   Filed Weights   01/19/19 1047  Weight: 164 lb 11.2 oz (74.7 kg)    Physical Exam  Constitutional: She is oriented to person, place, and time. No distress.  HENT:  Head: Normocephalic and atraumatic.  Nose: Nose normal.  Mouth/Throat: Oropharynx is clear and moist. No oropharyngeal exudate.  Eyes: Pupils are equal, round, and reactive to light. EOM are normal. No scleral icterus.  Neck: Normal range of motion. Neck supple. No JVD present.  Cardiovascular: Normal rate, regular rhythm, normal heart sounds and intact distal pulses. Exam reveals no friction rub.  No murmur heard. Pulmonary/Chest: Effort normal and breath sounds normal. No respiratory distress. She has no rales.  She exhibits no tenderness.  Abdominal: Soft. Bowel sounds are normal. She exhibits no distension. There is no abdominal tenderness.  Musculoskeletal: Normal range of motion.        General: No tenderness, deformity or edema.  Lymphadenopathy:    She has no cervical adenopathy.  Neurological: She is alert and oriented to person, place, and time. No cranial nerve deficit. She exhibits normal muscle  tone. Coordination normal.  Skin: Skin is warm and dry. She is not diaphoretic. No erythema.  Psychiatric: Affect normal.  Breast exam was performed in seated and lying down position. Patient is status post right breast lumpectomy with a well-healed surgical scar. No evidence of any right palpable masses. No palpable right axillary adenopathy. No palpable masses or lumps in the left breast. No evidence of left axillary adenopathy    Lab Results  Component Value Date   WBC 6.0 10/16/2018   HGB 13.9 10/16/2018   HCT 41.2 10/16/2018   MCV 87.5 10/16/2018   PLT 265 10/16/2018   Recent Labs    05/14/18 1336 07/16/18 1418 10/16/18 0948  NA 138 140 137  K 4.4 3.9 4.0  CL 104 104 103  CO2 '30 26 28  ' GLUCOSE 100* 96 92  BUN '14 17 14  ' CREATININE 0.59 0.52 0.57  CALCIUM 9.1 9.3 9.2  GFRNONAA >60 >60 >60  GFRAA >60 >60 >60  PROT 7.4 7.6 7.5  ALBUMIN 4.1 4.3 4.4  AST '20 20 18  ' ALT '16 16 11  ' ALKPHOS 63 62 69  BILITOT 0.4 0.4 0.5    2D Echo was done which showed LVEF 55-65%, mild mitral valve regurgitation.  12/29/2017  Pathology Surgical Pathology  CASE: [id number]  DIAGNOSIS:  A. BREAST MASS, RIGHT; NEEDLE LOCALIZED LUMPECTOMY:  - RESIDUAL INVASIVE MAMMARY CARCINOMA, STATUS POST NEOADJUVANT CHEMOTHERAPY.  - DUCTAL CARCINOMA IN SITU.  - SEE CANCER SUMMARY BELOW.  - PRIOR BIOPSY SITE CHANGE WITH CLIP.   B. SENTINEL LYMPH NODE 1, RIGHT; EXCISION:  - ONE LYMPH NODE WITH FOCUS SUSPICIOUS FOR ISOLATED TUMOR CELLS.  - TWO LYMPH NODES NEGATIVE FOR MALIGNANCY.  - SEE COMMENT.   C.  SENTINEL LYMPH NODE 2, RIGHT; EXCISION:  - ONE LYMPH NODE NEGATIVE FOR MALIGNANCY (0/1).   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  Margins:  Invasive Carcinoma Margins: Uninvolved by invasive carcinoma  Distance from closest margin: 2 mm  Specify closest margin: Superior  DCIS Margins: Uninvolved by DCIS  Distance from closest margin: 3 mm  Specify closest margin: Superior  Regional Lymph Nodes: Involved by tumor cells       Number of Lymph Nodes with Macrometastases (>2 mm): 0       Number of Lymph Nodes with Micrometastases (>0.2 mm to 2 mm  or >200 cells): 0  Number of Lymph Nodes with Isolated Tumor Cells (=0.2 mm or =200 cells): 1  Size of Largest Metastatic Deposit: Less than 0.2 mm  Extranodal Extension: Not identified  Number of Lymph Nodes Examined: 4  Number of Sentinel Nodes Examined: 4  Treatment Effect:  Treatment Effect in the Breast: Definite response to presurgical therapy in the invasive carcinoma  Treatment Effect in the Lymph Nodes: Suspicious for isolated tumor cells, no prominent fibrous scarring in the nodes  Residual Cancer Burden (RCB) from MD [facility] calculator (website below):  Primary Tumor Bed  Primary tumor bed: 44 mm x 22 mm  Overall cancer cellularity: 10 %  Percentage of cancer that is in situ: 1 %  Lymph nodes  Number of lymph nodes positive for metastasis:  0  Residual Cancer Burden: 1.695  Residual Cancer Burden Class: RCB-II  [email or web address]  Lymphovascular Invasion: Present   Pathologic Stage Classification (pTNM, AJCC 8th Edition): ypT2 ypN0 (i+) (sn)  TNM Descriptors: y - neoadjuvant   BREAST BIOMARKER TESTS - performed on prior biopsy  Estrogen Receptor (ER) Status: POSITIVE, >90% nuclear staining  Progesterone Receptor (PgR) Status: POSITIVE, >90% nuclear staining  HER2 (by immunohistochemistry): EQUIVOCAL  HER2 (ERBB2) (by in situ hybridization): NEGATIVE    RADIOGRAPHIC STUDIES: I have personally reviewed the radiological images as listed and agreed with the findings in the report. 06/22/2018  Diagnostic mammogram bilateral no evidence of malignancy within either breast.  ASSESSMENT & PLAN:  1. Malignant neoplasm of upper-outer quadrant of right breast in female, estrogen receptor positive ([place])   2. Aromatase inhibitor use   3. Port-A-Cath in place   4. Vasomotor symptoms due to menopause   5. Suppression of ovarian secretion   Cancer Staging Malignant neoplasm of upper-outer quadrant of right breast in female, estrogen receptor positive ([place]) Staging form: Breast, AJCC 8th Edition - Clinical stage from 07/15/2017: Stage IB (cT2, cN0, cM0, G2, ER: Positive, PR: Positive, HER2: Negative) - Signed by [name], MD on 07/15/2017 #ypT2 ypN0 (i+) (sn), grade 2 breast cancer, ER/PR positive, HER 2 negative. Patient is clinically doing well.  Diagnostic mammogram done in December 2019. Due in December 2020. Labs are reviewed and discussed with patient. Tolerating Zoladex monthly.  Proceed with Zoladex today. Continue Aromasin. Manageable vasomotor symptoms.  Port-A-Cath in place.  Continue port flush every 6 to 8 weeks.  We discussed about possible discontinuing of Mediport after her next mammogram.   #Chronic aromatase inhibitor use: Recommend to obtain baseline DEXA. Continue calcium and vitamin D supplementation. I recommend adjuvant bisphosphonate with Zometa every 6 months for 2 years to reduce skeletal events, prevent recurrence to the bone, improve  survival. Currently awaiting dental clearance prior to start bisphosphonate treatments.  We spent sufficient time to discuss many aspect of care, questions were answered to patient's satisfaction. The patient knows to call the clinic with any problems questions or concerns.  Return visit: She will follow-up monthly for Zoladex injections, obtain CBC, CMP and follow-up in the clinic in 3 months   [name], MD, PhD Hematology [place] [facility] Pager- [phone] 01/19/2019

## 2019-01-19 NOTE — Progress Notes (Signed)
Patient here for follow up. No concerns voiced.  °

## 2019-01-21 ENCOUNTER — Ambulatory Visit
Admission: RE | Admit: 2019-01-21 | Discharge: 2019-01-21 | Disposition: A | Discharge status: Home / Self Care | Payer: 59 | Source: Ambulatory Visit | Attending: Oncology | Admitting: Oncology

## 2019-01-21 ENCOUNTER — Other Ambulatory Visit: Payer: 59

## 2019-01-21 ENCOUNTER — Other Ambulatory Visit: Payer: Self-pay

## 2019-01-21 DIAGNOSIS — Z170 Estrogen receptor positive status [ER+]: Secondary | ICD-10-CM | POA: Diagnosis present

## 2019-01-21 DIAGNOSIS — C50411 Malignant neoplasm of upper-outer quadrant of right female breast: Secondary | ICD-10-CM | POA: Diagnosis present

## 2019-01-21 DIAGNOSIS — Z79811 Long term (current) use of aromatase inhibitors: Secondary | ICD-10-CM | POA: Insufficient documentation

## 2019-01-25 ENCOUNTER — Encounter: Payer: Self-pay | Admitting: *Deleted

## 2019-02-18 ENCOUNTER — Telehealth: Payer: Self-pay

## 2019-02-18 ENCOUNTER — Encounter: Payer: Self-pay | Admitting: Oncology

## 2019-02-18 ENCOUNTER — Encounter: Payer: Self-pay | Admitting: *Deleted

## 2019-02-18 ENCOUNTER — Other Ambulatory Visit: Payer: Self-pay

## 2019-02-18 DIAGNOSIS — C50411 Malignant neoplasm of upper-outer quadrant of right female breast: Secondary | ICD-10-CM

## 2019-02-18 NOTE — Telephone Encounter (Signed)
Patient called and spoke with Kathi Simpers. 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 She wanted to know if she needed to cancel her appointment for tomorrow and if she needed to be tested.  I then mentioned it to Dr. Tasia Catchings and she stated that patient's appointment will need to be rescheduled after we get her test result.  I left a message on patient's cell phone and told her that she needed to go to the [facility] building to be tested. Then we would call her with her new appointments.

## 2019-02-19 ENCOUNTER — Other Ambulatory Visit: Payer: Self-pay

## 2019-02-19 ENCOUNTER — Inpatient Hospital Stay: Payer: 59

## 2019-02-19 DIAGNOSIS — Z20822 Contact with and (suspected) exposure to covid-19: Secondary | ICD-10-CM

## 2019-02-21 LAB — NOVEL CORONAVIRUS, NAA: SARS-CoV-2, NAA: NOT DETECTED

## 2019-02-25 ENCOUNTER — Other Ambulatory Visit: Payer: Self-pay | Admitting: Oncology

## 2019-03-02 ENCOUNTER — Inpatient Hospital Stay: Payer: 59

## 2019-03-02 ENCOUNTER — Other Ambulatory Visit: Payer: Self-pay

## 2019-03-02 ENCOUNTER — Inpatient Hospital Stay: Payer: 59 | Attending: Oncology

## 2019-03-02 DIAGNOSIS — C50411 Malignant neoplasm of upper-outer quadrant of right female breast: Secondary | ICD-10-CM | POA: Diagnosis not present

## 2019-03-02 DIAGNOSIS — Z79811 Long term (current) use of aromatase inhibitors: Secondary | ICD-10-CM | POA: Insufficient documentation

## 2019-03-02 DIAGNOSIS — N951 Menopausal and female climacteric states: Secondary | ICD-10-CM | POA: Insufficient documentation

## 2019-03-02 DIAGNOSIS — Z170 Estrogen receptor positive status [ER+]: Secondary | ICD-10-CM

## 2019-03-02 DIAGNOSIS — E2839 Other primary ovarian failure: Secondary | ICD-10-CM | POA: Insufficient documentation

## 2019-03-02 DIAGNOSIS — Z95828 Presence of other vascular implants and grafts: Secondary | ICD-10-CM

## 2019-03-02 MED ORDER — SODIUM CHLORIDE 0.9% FLUSH
10.0000 mL | Freq: Once | INTRAVENOUS | Status: AC
  Administered 2019-03-02: 10:00:00 10 mL via INTRAVENOUS
  Filled 2019-03-02: qty 10

## 2019-03-02 MED ORDER — HEPARIN SOD (PORK) LOCK FLUSH 100 UNIT/ML IV SOLN
500.0000 [IU] | Freq: Once | INTRAVENOUS | Status: AC
  Administered 2019-03-02: 10:00:00 500 [IU] via INTRAVENOUS

## 2019-03-02 MED ORDER — HEPARIN SOD (PORK) LOCK FLUSH 100 UNIT/ML IV SOLN
INTRAVENOUS | Status: AC
  Filled 2019-03-02: qty 5

## 2019-03-02 MED ORDER — GOSERELIN ACETATE 3.6 MG ~~LOC~~ IMPL
3.6000 mg | DRUG_IMPLANT | Freq: Once | SUBCUTANEOUS | Status: AC
  Administered 2019-03-02: 3.6 mg via SUBCUTANEOUS
  Filled 2019-03-02: qty 3.6

## 2019-03-16 ENCOUNTER — Telehealth: Payer: Self-pay | Admitting: *Deleted

## 2019-03-16 NOTE — Progress Notes (Signed)
Tried calling patient no answer, left message

## 2019-03-16 NOTE — Telephone Encounter (Signed)
Sure. Please schedule. Thank you

## 2019-03-16 NOTE — Telephone Encounter (Signed)
Patient called asking to come in and be checked tomorrow as she has a lump on her left breast. Please advise

## 2019-03-16 NOTE — Telephone Encounter (Signed)
Just got off the phone with pt, she will be here tomorrow at 11:00.

## 2019-03-17 ENCOUNTER — Other Ambulatory Visit: Payer: Self-pay

## 2019-03-17 ENCOUNTER — Encounter: Payer: Self-pay | Admitting: Oncology

## 2019-03-17 ENCOUNTER — Inpatient Hospital Stay: Payer: 59 | Attending: Oncology | Admitting: Oncology

## 2019-03-17 VITALS — BP 126/84 | HR 92 | Temp 99.2°F | Wt 161.6 lb

## 2019-03-17 DIAGNOSIS — Z800 Family history of malignant neoplasm of digestive organs: Secondary | ICD-10-CM | POA: Insufficient documentation

## 2019-03-17 DIAGNOSIS — Z8043 Family history of malignant neoplasm of testis: Secondary | ICD-10-CM | POA: Insufficient documentation

## 2019-03-17 DIAGNOSIS — Z79811 Long term (current) use of aromatase inhibitors: Secondary | ICD-10-CM | POA: Diagnosis not present

## 2019-03-17 DIAGNOSIS — C50411 Malignant neoplasm of upper-outer quadrant of right female breast: Secondary | ICD-10-CM | POA: Insufficient documentation

## 2019-03-17 DIAGNOSIS — Z79899 Other long term (current) drug therapy: Secondary | ICD-10-CM | POA: Diagnosis not present

## 2019-03-17 DIAGNOSIS — J45909 Unspecified asthma, uncomplicated: Secondary | ICD-10-CM | POA: Diagnosis not present

## 2019-03-17 DIAGNOSIS — Z9221 Personal history of antineoplastic chemotherapy: Secondary | ICD-10-CM | POA: Insufficient documentation

## 2019-03-17 DIAGNOSIS — Z803 Family history of malignant neoplasm of breast: Secondary | ICD-10-CM | POA: Diagnosis not present

## 2019-03-17 DIAGNOSIS — Z170 Estrogen receptor positive status [ER+]: Secondary | ICD-10-CM | POA: Insufficient documentation

## 2019-03-17 DIAGNOSIS — L739 Follicular disorder, unspecified: Secondary | ICD-10-CM | POA: Diagnosis not present

## 2019-03-17 NOTE — Progress Notes (Signed)
Patient here today for red area on left breast, pt noticed on Monday evening, not warm to the touch, has not changed in size or color.

## 2019-03-18 NOTE — Progress Notes (Signed)
Hematology/Oncology Follow up note [facility] Telephone:([phone] Fax:(336) [phone]   Patient Care Team: [name], NP as PCP - General (Nurse Practitioner) Theodore Demark, RN as Oncology Nurse Navigator [name], [name], MD as Referring Physician (Surgery) [name], MD as Consulting Physician (Oncology) [name], MD as Consulting Physician (General Surgery) [name], MD as Referring Physician (Radiation Oncology)  REASON FOR VISIT Follow up for management of breast cancer   HISTORY OF PRESENTING ILLNESS:  Anne Wu is a  38 y.o.  female with diagnosis of cT2N0M0, grade 2 invasive mammary carcinoma.  She has significant family history on both maternal side as well as paternal side. She felt a right breast mass 2 months ago and diagnostic mammogram showed right upper quandrant 2.2 cm mass, which was confirmed on [place] and biopsied. Right axillary no clinically suspicious lymph node. Pathology showed invasive mammary carcinoma, ER/PR positive,, HER2 IHC  equivocal, FISH negative.   # Patient was evaluated by Dr.Smith. From surgical standpoint, Dr.Smith recommend neoadjuvant chemotherapy to facilitate surgery to obtain negative margin and better surgical outcome. Also given patient's young age, neoadjuvant chemotherapy is reasonable.    #Patient stated that she does not desire future fertility.  # 2D Echo showed normal systolic function, mild mitral regurgitation.  # CT was done  to rule out metastatic disease given micrometastasis. Images were independently reviewed by me and discussed with patient.  Negative for metastatic disease. # Testing did not reveal any pathogenic mutation in any of these genes. A copy of the genetic test report will be scanned into Epic under the Media tab.The genes analyzed were the 23 genes on [facility]'s Breast/GYN panel (ATM, BARD1, BRCA1, BRCA2, BRIP1, CDH1, CHEK2, DICER1, EPCAM, MLH1,  MSH2, MSH6,  NBN, NF1, PALB2, PMS2, PTEN, RAD50, RAD51C, RAD51D,SMARCA4, STK11, and TP53). 09/08/2017 Interval mammogram after 4 cycle of AC, showed good treatment response. The biopsy-proven malignancy in the upper outer quadrant of the right breast at posterior depth, associated with scattered microcalcifications and architectural distortion, has significantly decreased in size since the mammogram 06/20/2017. On today's mammogram, the mass measures approximately 1.7 x 2.2 x 1.2 cm (previously 2.4 x 1.6 x 2.7 cm).   Treatment Summary :  Jan 2019- May 2019 Neoadjuvant AC-->T  12/29/2017 Right lumpectomy and sentinel LN biopsy Pathology showed ypT2 ypN0 (i+) (sn) case was discussed on tumor board.ER/PR positive, HER 2 negative.  Aug 2019-September 2019 Adjuvant RT  Oct 2019 to start Zoladex and Aromasin.   INTERVAL HISTORY Anne Wu is a 38 y.o. female who has above history reviewed by me presents for acute visit. Patient called and reported that noticed a lump on her left breast. Patient is taking Aromasin daily.  Tolerating well.  She is also on ovarian suppression with Zoladex monthly. She noticed an area of her left breast which turned red a few days ago. No associating factors.  The area is little itchy, no pain.  Denies any fever, chills.      Review of Systems  Constitutional: Positive for fatigue. Negative for appetite change, chills and fever.  HENT:   Negative for hearing loss and voice change.   Eyes: Negative for eye problems.  Respiratory: Negative for chest tightness and cough.   Cardiovascular: Negative for chest pain.  Gastrointestinal: Negative for abdominal distention, abdominal pain and blood in stool.  Endocrine: Positive for hot flashes.  Genitourinary: Negative for difficulty urinating and frequency.   Musculoskeletal: Positive for arthralgias.  Skin: Negative for itching and  rash.       Left breast red area  Neurological: Negative for dizziness, extremity weakness and  light-headedness.  Hematological: Negative for adenopathy.  Psychiatric/Behavioral: Negative for confusion. The patient is not nervous/anxious.     MEDICAL HISTORY:  Past Medical History:  Diagnosis Date  . Asthma    AS A CHILD-NO INHALERS  . Breast cancer ([place]) 2018   Right breast cancer-[facility]  . Elevated blood pressure, situational    PT STATES HER LAST COUPLE OF MD APPOINTMENTS SHE HAS HAD ELEVATED BP-NEVER HAD A PROBLEM WITH THIS PREVIOUSLY  . Genetic testing 09/05/2017   Breast/GYN panel (23 genes) @ [facility] - No pathogenic mutations detected  . GERD (gastroesophageal reflux disease)    OCC- NO MEDS  . Malignant neoplasm of upper-outer quadrant of right breast in female, estrogen receptor positive ([place]) 2019   [facility] and DCIS  . Personal history of chemotherapy     SURGICAL HISTORY: Past Surgical History:  Procedure Laterality Date  . BREAST BIOPSY Right 06/20/2017   Invasive Mammary Carcinoma  . BREAST EXCISIONAL BIOPSY Right 12/29/2017   RESIDUAL INVASIVE MAMMARY CARCINOMA and DCIS  . BREAST LUMPECTOMY Right 12/29/2017   needle localization and sentinel node injection  . NO PAST SURGERIES    . PARTIAL MASTECTOMY WITH NEEDLE LOCALIZATION Right 12/29/2017   Procedure: PARTIAL MASTECTOMY WITH NEEDLE LOCALIZATION;  Surgeon: [name], MD;  Location: [facility] ORS;  Service: General;  Laterality: Right;  . PORTACATH PLACEMENT N/A 07/11/2017   Procedure: INSERTION PORT-A-CATH;  Surgeon: Leonie Green, MD;  Location: [facility] ORS;  Service: General;  Laterality: N/A;  . SENTINEL NODE BIOPSY Right 12/29/2017   Procedure: SENTINEL NODE BIOPSY;  Surgeon: [name], MD;  Location: [facility] ORS;  Service: General;  Laterality: Right;    SOCIAL HISTORY: Social History   Socioeconomic History  . Marital status: Married    Spouse name: Not on file  . Number of children: Not on file  . Years of education: Not on file  . Highest education level: Not on file   Occupational History  . Not on file  Social Needs  . Financial resource strain: Not on file  . Food insecurity    Worry: Not on file    Inability: Not on file  . Transportation needs    Medical: Not on file    Non-medical: Not on file  Tobacco Use  . Smoking status: Never Smoker  . Smokeless tobacco: Never Used  Substance and Sexual Activity  . Alcohol use: No    Alcohol/week: 0.0 standard drinks  . Drug use: No  . Sexual activity: Yes  Lifestyle  . Physical activity    Days per week: Not on file    Minutes per session: Not on file  . Stress: Not on file  Relationships  . Social Herbalist on phone: Not on file    Gets together: Not on file    Attends religious service: Not on file    Active member of club or organization: Not on file    Attends meetings of clubs or organizations: Not on file    Relationship status: Not on file  . Intimate partner violence    Fear of current or ex partner: Not on file    Emotionally abused: Not on file    Physically abused: Not on file    Forced sexual activity: Not on file  Other Topics Concern  . Not on file  Social History Narrative  .  Not on file    FAMILY HISTORY: Family History  Problem Relation Age of Onset  . Breast cancer Maternal Aunt 47       currently late 65s  . Breast cancer Maternal Grandmother 68       second breast vs. recurrence ca at 47; deceased at 45  . Colon cancer Maternal Uncle 49       deceased in 84s  . Breast cancer Paternal Aunt        age at dx unknown; currently in 66s  . Breast cancer Paternal Grandmother        age at dx unknown  . Melanoma Mother        on leg  . Testicular cancer Maternal Uncle 33       currently 3s    ALLERGIES:  is allergic to codeine.  MEDICATIONS:  Current Outpatient Medications  Medication Sig Dispense Refill  . acetaminophen (TYLENOL) 325 MG tablet Take 650 mg by mouth every 6 (six) hours as needed for moderate pain or headache.     .  cholecalciferol (VITAMIN D) 1000 units tablet Take 1,000 Units by mouth daily.    Marland Kitchen exemestane (AROMASIN) 25 MG tablet TAKE 1 TABLET (25 MG TOTAL) BY MOUTH DAILY AFTER BREAKFAST. 90 tablet 3  . lidocaine-prilocaine (EMLA) cream APPLY TO AFFECTED AREA ONCE 30 g 3  . loratadine (CLARITIN) 10 MG tablet Take 10 mg by mouth daily as needed for allergies.     . Multiple Vitamin (MULTI-VITAMINS) TABS Take 1 tablet by mouth daily.      No current facility-administered medications for this visit.    Facility-Administered Medications Ordered in Other Visits  Medication Dose Route Frequency Provider Last Rate Last Dose  . heparin lock flush 100 unit/mL  500 Units Intravenous Once [name], MD      . sodium chloride flush (NS) 0.9 % injection 10 mL  10 mL Intravenous PRN [name], MD   10 mL at 08/01/17 3875     PHYSICAL EXAMINATION: ECOG PERFORMANCE STATUS: 1 - Symptomatic but completely ambulatory Vitals:   03/17/19 1140  BP: 126/84  Pulse: 92  Temp: 99.2 F (37.3 C)   Filed Weights   03/17/19 1140  Weight: 161 lb 9 oz (73.3 kg)    Physical Exam  Constitutional: She is oriented to person, place, and time. No distress.  HENT:  Head: Normocephalic and atraumatic.  Nose: Nose normal.  Mouth/Throat: Oropharynx is clear and moist. No oropharyngeal exudate.  Eyes: Pupils are equal, round, and reactive to light. EOM are normal. No scleral icterus.  Neck: Normal range of motion. Neck supple. No JVD present.  Cardiovascular: Normal rate, regular rhythm, normal heart sounds and intact distal pulses. Exam reveals no friction rub.  No murmur heard. Pulmonary/Chest: Effort normal and breath sounds normal. No respiratory distress. She has no rales. She exhibits no tenderness.  Abdominal: Soft. Bowel sounds are normal. She exhibits no distension. There is no abdominal tenderness.  Musculoskeletal: Normal range of motion.        General: No tenderness, deformity or edema.  Lymphadenopathy:    She  has no cervical adenopathy.  Neurological: She is alert and oriented to person, place, and time. No cranial nerve deficit. She exhibits normal muscle tone. Coordination normal.  Skin: Skin is warm and dry. She is not diaphoretic. No erythema.  Psychiatric: Affect normal.  Breast exam was performed in seated  position. Erythematous papule on left breast skin  Lab Results  Component Value Date   WBC 6.1 01/19/2019   HGB 13.3 01/19/2019   HCT 39.4 01/19/2019   MCV 88.3 01/19/2019   PLT 255 01/19/2019   Recent Labs    07/16/18 1418 10/16/18 0948 01/19/19 1029  NA 140 137 138  K 3.9 4.0 4.0  CL 104 103 101  CO2 _0 GLUCOSE 96 92 97  BUN _1 CREATININE 0.52 0.57 0.53  CALCIUM 9.3 9.2 9.4  GFRNONAA >60 >60 >60  GFRAA >60 >60 >60  PROT 7.6 7.5 7.6  ALBUMIN 4.3 4.4 4.5  AST _2 ALT _3 ALKPHOS 62 69 71  BILITOT 0.4 0.5 0.6    2D Echo was done which showed LVEF 55-65%, mild mitral valve regurgitation.  12/29/2017  Pathology Surgical Pathology  CASE: [id number]  DIAGNOSIS:  A. BREAST MASS, RIGHT; NEEDLE LOCALIZED LUMPECTOMY:  - RESIDUAL INVASIVE MAMMARY CARCINOMA, STATUS POST NEOADJUVANT CHEMOTHERAPY.  - DUCTAL CARCINOMA IN SITU.  - SEE CANCER SUMMARY BELOW.  - PRIOR BIOPSY SITE CHANGE WITH CLIP.   B. SENTINEL LYMPH NODE 1, RIGHT; EXCISION:  - ONE LYMPH NODE WITH FOCUS SUSPICIOUS FOR ISOLATED TUMOR CELLS.  - TWO LYMPH NODES NEGATIVE FOR MALIGNANCY.  - SEE COMMENT.   C. SENTINEL LYMPH NODE 2, RIGHT; EXCISION:  - ONE LYMPH NODE NEGATIVE FOR MALIGNANCY (0/1).   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  Margins:  Invasive Carcinoma Margins: Uninvolved by invasive carcinoma  Distance from closest margin: 2 mm  Specify closest margin: Superior  DCIS Margins: Uninvolved by DCIS  Distance from closest margin: 3 mm  Specify closest margin: Superior  Regional Lymph Nodes: Involved by tumor cells       Number of Lymph Nodes with Macrometastases (>2 mm): 0       Number of Lymph Nodes with Micrometastases (>0.2 mm to 2 mm  or >200 cells): 0  Number of Lymph Nodes with Isolated Tumor Cells (=0.2 mm or =200 cells): 1  Size of Largest Metastatic Deposit: Less than 0.2 mm  Extranodal Extension: Not identified  Number of Lymph Nodes Examined: 4  Number of Sentinel Nodes Examined: 4  Treatment Effect:  Treatment Effect in the Breast: Definite response to presurgical therapy in the invasive carcinoma  Treatment Effect in the Lymph Nodes: Suspicious for isolated tumor cells, no prominent fibrous scarring in the nodes  Residual Cancer Burden (RCB) from MD [facility] calculator (website below):  Primary Tumor Bed  Primary tumor bed: 44 mm x 22 mm  Overall cancer cellularity: 10 %  Percentage of cancer that is in situ: 1 %  Lymph nodes  Number of lymph nodes positive for metastasis: 0  Residual Cancer Burden: 1.695  Residual Cancer Burden Class: RCB-II  [email or web address]  Lymphovascular Invasion: Present  Pathologic Stage Classification (pTNM, AJCC 8th Edition): ypT2 ypN0 (i+) (sn)  TNM Descriptors: y - neoadjuvant   BREAST BIOMARKER TESTS - performed on prior biopsy  Estrogen Receptor (ER) Status: POSITIVE, >90% nuclear staining  Progesterone Receptor (PgR) Status: POSITIVE, >90% nuclear staining  HER2 (by immunohistochemistry): EQUIVOCAL  HER2 (ERBB2) (by  in situ hybridization): NEGATIVE    RADIOGRAPHIC STUDIES: I have personally reviewed the radiological  images as listed and agreed with the findings in the report. 06/22/2018  Diagnostic mammogram bilateral no evidence of malignancy within either breast.  ASSESSMENT & PLAN:  1. Folliculitis   Cancer Staging Malignant neoplasm of upper-outer quadrant of right breast in female, estrogen receptor positive ([place]) Staging form: Breast, AJCC 8th Edition - Clinical stage from 07/15/2017: Stage IB (cT2, cN0, cM0, G2, ER: Positive, PR: Positive, HER2: Negative) - Signed by [name], MD on 0/10/8887  #Folliculitis Recommend patient apply Neosporin antibiotic cream.  If no improvement she will update me.  We spent sufficient time to discuss many aspect of care, questions were answered to patient's satisfaction. Total face to face encounter time for this patient visit was 15 min. >50% of the time was  spent in counseling and coordination of care.   [name], MD, PhD Hematology Oncology [facility] at [facility] Pager- [phone] 03/18/2019

## 2019-03-19 ENCOUNTER — Inpatient Hospital Stay: Payer: 59

## 2019-03-19 ENCOUNTER — Encounter: Payer: Self-pay | Admitting: *Deleted

## 2019-03-30 ENCOUNTER — Other Ambulatory Visit: Payer: Self-pay

## 2019-03-30 ENCOUNTER — Inpatient Hospital Stay: Payer: 59

## 2019-03-30 DIAGNOSIS — C50411 Malignant neoplasm of upper-outer quadrant of right female breast: Secondary | ICD-10-CM

## 2019-03-30 DIAGNOSIS — Z170 Estrogen receptor positive status [ER+]: Secondary | ICD-10-CM

## 2019-03-30 MED ORDER — GOSERELIN ACETATE 3.6 MG ~~LOC~~ IMPL
3.6000 mg | DRUG_IMPLANT | Freq: Once | SUBCUTANEOUS | Status: AC
  Administered 2019-03-30: 3.6 mg via SUBCUTANEOUS
  Filled 2019-03-30: qty 3.6

## 2019-04-13 ENCOUNTER — Inpatient Hospital Stay: Payer: 59

## 2019-04-16 ENCOUNTER — Ambulatory Visit: Payer: 59 | Admitting: Oncology

## 2019-04-16 ENCOUNTER — Inpatient Hospital Stay: Payer: 59

## 2019-04-16 ENCOUNTER — Other Ambulatory Visit: Payer: 59

## 2019-04-26 ENCOUNTER — Ambulatory Visit: Payer: 59 | Admitting: Radiation Oncology

## 2019-04-26 NOTE — Progress Notes (Signed)
Called patient no answer left message  

## 2019-04-27 ENCOUNTER — Other Ambulatory Visit: Payer: Self-pay

## 2019-04-27 ENCOUNTER — Inpatient Hospital Stay (HOSPITAL_BASED_OUTPATIENT_CLINIC_OR_DEPARTMENT_OTHER): Payer: 59 | Admitting: Oncology

## 2019-04-27 ENCOUNTER — Encounter: Payer: Self-pay | Admitting: Oncology

## 2019-04-27 ENCOUNTER — Ambulatory Visit
Admission: RE | Admit: 2019-04-27 | Discharge: 2019-04-27 | Disposition: A | Discharge status: Home / Self Care | Payer: 59 | Source: Ambulatory Visit | Attending: Radiation Oncology | Admitting: Radiation Oncology

## 2019-04-27 ENCOUNTER — Inpatient Hospital Stay: Payer: 59 | Attending: Oncology

## 2019-04-27 ENCOUNTER — Inpatient Hospital Stay: Payer: 59

## 2019-04-27 VITALS — BP 123/80 | HR 92 | Temp 97.7°F | Resp 18 | Wt 160.5 lb

## 2019-04-27 DIAGNOSIS — E2839 Other primary ovarian failure: Secondary | ICD-10-CM

## 2019-04-27 DIAGNOSIS — Z5111 Encounter for antineoplastic chemotherapy: Secondary | ICD-10-CM | POA: Diagnosis not present

## 2019-04-27 DIAGNOSIS — Z79899 Other long term (current) drug therapy: Secondary | ICD-10-CM | POA: Diagnosis not present

## 2019-04-27 DIAGNOSIS — C50411 Malignant neoplasm of upper-outer quadrant of right female breast: Secondary | ICD-10-CM

## 2019-04-27 DIAGNOSIS — Z95828 Presence of other vascular implants and grafts: Secondary | ICD-10-CM

## 2019-04-27 DIAGNOSIS — Z170 Estrogen receptor positive status [ER+]: Secondary | ICD-10-CM | POA: Insufficient documentation

## 2019-04-27 DIAGNOSIS — J45909 Unspecified asthma, uncomplicated: Secondary | ICD-10-CM | POA: Diagnosis not present

## 2019-04-27 DIAGNOSIS — N951 Menopausal and female climacteric states: Secondary | ICD-10-CM

## 2019-04-27 DIAGNOSIS — Z9221 Personal history of antineoplastic chemotherapy: Secondary | ICD-10-CM | POA: Diagnosis not present

## 2019-04-27 DIAGNOSIS — Z79811 Long term (current) use of aromatase inhibitors: Secondary | ICD-10-CM

## 2019-04-27 DIAGNOSIS — L739 Follicular disorder, unspecified: Secondary | ICD-10-CM | POA: Diagnosis not present

## 2019-04-27 DIAGNOSIS — Z923 Personal history of irradiation: Secondary | ICD-10-CM | POA: Insufficient documentation

## 2019-04-27 LAB — CBC WITH DIFFERENTIAL/PLATELET
Abs Immature Granulocytes: 0.02 10*3/uL (ref 0.00–0.07)
Basophils Absolute: 0 10*3/uL (ref 0.0–0.1)
Basophils Relative: 0 %
Eosinophils Absolute: 0.1 10*3/uL (ref 0.0–0.5)
Eosinophils Relative: 1 %
HCT: 38.7 % (ref 36.0–46.0)
Hemoglobin: 12.9 g/dL (ref 12.0–15.0)
Immature Granulocytes: 0 %
Lymphocytes Relative: 22 %
Lymphs Abs: 1.7 10*3/uL (ref 0.7–4.0)
MCH: 29.3 pg (ref 26.0–34.0)
MCHC: 33.3 g/dL (ref 30.0–36.0)
MCV: 88 fL (ref 80.0–100.0)
Monocytes Absolute: 0.5 10*3/uL (ref 0.1–1.0)
Monocytes Relative: 6 %
Neutro Abs: 5.5 10*3/uL (ref 1.7–7.7)
Neutrophils Relative %: 71 %
Platelets: 269 10*3/uL (ref 150–400)
RBC: 4.4 MIL/uL (ref 3.87–5.11)
RDW: 11.5 % (ref 11.5–15.5)
WBC: 7.7 10*3/uL (ref 4.0–10.5)
nRBC: 0 % (ref 0.0–0.2)

## 2019-04-27 LAB — COMPREHENSIVE METABOLIC PANEL
ALT: 15 U/L (ref 0–44)
AST: 21 U/L (ref 15–41)
Albumin: 4.3 g/dL (ref 3.5–5.0)
Alkaline Phosphatase: 68 U/L (ref 38–126)
Anion gap: 8 (ref 5–15)
BUN: 16 mg/dL (ref 6–20)
CO2: 28 mmol/L (ref 22–32)
Calcium: 9.2 mg/dL (ref 8.9–10.3)
Chloride: 103 mmol/L (ref 98–111)
Creatinine, Ser: 0.6 mg/dL (ref 0.44–1.00)
GFR calc Af Amer: >60 mL/min (ref >60)
GFR calc non Af Amer: >60 mL/min (ref >60)
Glucose, Bld: 96 mg/dL (ref 70–99)
Potassium: 3.9 mmol/L (ref 3.5–5.1)
Sodium: 139 mmol/L (ref 135–145)
Total Bilirubin: 0.5 mg/dL (ref 0.3–1.2)
Total Protein: 7.3 g/dL (ref 6.5–8.1)

## 2019-04-27 MED ORDER — SODIUM CHLORIDE 0.9% FLUSH
10.0000 mL | Freq: Once | INTRAVENOUS | Status: AC
  Administered 2019-04-27: 10 mL via INTRAVENOUS
  Filled 2019-04-27: qty 10

## 2019-04-27 MED ORDER — HEPARIN SOD (PORK) LOCK FLUSH 100 UNIT/ML IV SOLN
500.0000 [IU] | Freq: Once | INTRAVENOUS | Status: AC
  Administered 2019-04-27: 500 [IU] via INTRAVENOUS

## 2019-04-27 MED ORDER — GOSERELIN ACETATE 3.6 MG ~~LOC~~ IMPL
3.6000 mg | DRUG_IMPLANT | Freq: Once | SUBCUTANEOUS | Status: AC
  Administered 2019-04-27: 3.6 mg via SUBCUTANEOUS
  Filled 2019-04-27: qty 3.6

## 2019-04-27 NOTE — Progress Notes (Signed)
Radiation Oncology Follow up Note  Name: Anne Wu   Date:   04/27/2019 MRN:  [id number] DOB: March 16, 1981    This 38 y.o. female presents to the clinic today for 1 year follow-up status post whole breast radiation to her right breast and peripheral lymphatics for a T2N0 ER PR positive invasive mammary carcinoma.  REFERRING PROVIDER: [name], NP  HPI: Patient is a 38 year old female now at 1 year having completed whole breast radiation to her right breast and peripheral emphatic's for a T2N0 ER PR positive invasive mammary carcinoma.  Seen today in routine follow-up she is doing well.  She specifically denies breast tenderness cough or bone pain.  She is having no swelling in her right upper extremity..  She is currently on Aromasin tolerating that well without side effect.  Her last mammogram which I have reviewed was back in December was BI-RADS 2 benign.  COMPLICATIONS OF TREATMENT: none  FOLLOW UP COMPLIANCE: keeps appointments   PHYSICAL EXAM:  There were no vitals taken for this visit. Lungs are clear to A&P cardiac examination essentially unremarkable with regular rate and rhythm. No dominant mass or nodularity is noted in either breast in 2 positions examined. Incision is well-healed. No axillary or supraclavicular adenopathy is appreciated. Cosmetic result is excellent.  No evidence of lymphedema in her right upper extremity is noted.  There is minor surgical defect in the right axilla although cosmetic result is still good to excellent.  Well-developed well-nourished patient in NAD. HEENT reveals PERLA, EOMI, discs not visualized.  Oral cavity is clear. No oral mucosal lesions are identified. Neck is clear without evidence of cervical or supraclavicular adenopathy. Lungs are clear to A&P. Cardiac examination is essentially unremarkable with regular rate and rhythm without murmur rub or thrill. Abdomen is benign with no organomegaly or masses noted. Motor sensory and DTR levels  are equal and symmetric in the upper and lower extremities. Cranial nerves II through XII are grossly intact. Proprioception is intact. No peripheral adenopathy or edema is identified. No motor or sensory levels are noted. Crude visual fields are within normal range.  RADIOLOGY RESULTS: Mammograms reviewed compatible with above-stated findings  PLAN: Present time is now 1 year out from radiation and doing well with no evidence of distant disease.  I am pleased with her overall progress.  She continues on Aromasin without side effect.  I have asked to see her back in 1 year for follow-up.  She knows to call sooner with any concerns.  I would like to take this opportunity to thank you for allowing me to participate in the care of your patient.[name], MD

## 2019-04-27 NOTE — Progress Notes (Signed)
Hematology/Oncology Follow up note [facility] [phone] [phone]   Patient Care Team: [name], NP as PCP - General (Nurse Practitioner) Theodore Demark, RN as Oncology Nurse Navigator [name], [name], MD as Referring Physician (Surgery) [name], MD as Consulting Physician (Oncology) [name], MD as Consulting Physician (General Surgery) [name], MD as Referring Physician (Radiation Oncology)  REASON FOR VISIT Follow up for management of breast cancer   HISTORY OF PRESENTING ILLNESS:  Anne Wu is a  38 y.o.  female with diagnosis of cT2N0M0, grade 2 invasive mammary carcinoma.  She has significant family history on both maternal side as well as paternal side. She felt a right breast mass 2 months ago and diagnostic mammogram showed right upper quandrant 2.2 cm mass, which was confirmed on [place] and biopsied. Right axillary no clinically suspicious lymph node. Pathology showed invasive mammary carcinoma, ER/PR positive,, HER2 IHC  equivocal, FISH negative.   # Patient was evaluated by Dr.Smith. From surgical standpoint, Dr.Smith recommend neoadjuvant chemotherapy to facilitate surgery to obtain negative margin and better surgical outcome. Also given patient's young age, neoadjuvant chemotherapy is reasonable.    #Patient stated that she does not desire future fertility.  # 2D Echo showed normal systolic function, mild mitral regurgitation.  # CT was done  to rule out metastatic disease given micrometastasis. Images were independently reviewed by me and discussed with patient.  Negative for metastatic disease. # Testing did not reveal any pathogenic mutation in any of these genes. A copy of the genetic test report will be scanned into Epic under the Media tab.The genes analyzed were the 23 genes on [facility]'s Breast/GYN panel (ATM, BARD1, BRCA1, BRCA2, BRIP1, CDH1, CHEK2, DICER1, EPCAM, MLH1,  MSH2, MSH6,  NBN, NF1, PALB2, PMS2, PTEN, RAD50, RAD51C, RAD51D,SMARCA4, STK11, and TP53). 09/08/2017 Interval mammogram after 4 cycle of AC, showed good treatment response. The biopsy-proven malignancy in the upper outer quadrant of the right breast at posterior depth, associated with scattered microcalcifications and architectural distortion, has significantly decreased in size since the mammogram 06/20/2017. On today's mammogram, the mass measures approximately 1.7 x 2.2 x 1.2 cm (previously 2.4 x 1.6 x 2.7 cm).   Treatment Summary :  Jan 2019- May 2019 Neoadjuvant AC-->T  12/29/2017 Right lumpectomy and sentinel LN biopsy Pathology showed ypT2 ypN0 (i+) (sn) case was discussed on tumor board.ER/PR positive, HER 2 negative.  Aug 2019-September 2019 Adjuvant RT  Oct 2019 to start Zoladex and Aromasin.   INTERVAL HISTORY Anne Wu is a 38 y.o. female who has above history reviewed by me presents for follow up for breast cancer management  Patient takes Aromasin daily.  Tolerating well with manageable aches and pains. Patient is also on ovarian suppression with Zoladex monthly.  Tolerating well.  Patient reports that she has finished her dental work. We have received a handwritten note from her dentist that as long as she does not have any dental pain, she is cleared to proceed with bisphosphonate treatments.  Patient did mention that there was a discussion about her doing braces.  She will discuss with dentist again She denies any new concerns of her breast. She was seen for an acute visit 1 month ago due to noticing a spot on her left breast.  It was felt to be folliculitis.  Local erythema has improved.  There is a small hyperpigmentation spot.  Denies fever, chills, nausea, vomiting, diarrhea, chest pain, shortness of breath, abdominal pain, urinary symptoms, lower extremity swelling.  Review of Systems  Constitutional: Positive for fatigue. Negative for appetite change, chills and fever.   HENT:   Negative for hearing loss and voice change.        Finished dental work.   Eyes: Negative for eye problems.  Respiratory: Negative for chest tightness and cough.   Cardiovascular: Negative for chest pain.  Gastrointestinal: Negative for abdominal distention, abdominal pain and blood in stool.  Endocrine: Positive for hot flashes.  Genitourinary: Negative for difficulty urinating and frequency.   Musculoskeletal: Positive for arthralgias.  Skin: Negative for itching and rash.  Neurological: Negative for dizziness, extremity weakness and light-headedness.  Hematological: Negative for adenopathy.  Psychiatric/Behavioral: Negative for confusion. The patient is not nervous/anxious.     MEDICAL HISTORY:  Past Medical History:  Diagnosis Date  . Asthma    AS A CHILD-NO INHALERS  . Breast cancer ([place]) 2018   Right breast cancer-[facility]  . Elevated blood pressure, situational    PT STATES HER LAST COUPLE OF MD APPOINTMENTS SHE HAS HAD ELEVATED BP-NEVER HAD A PROBLEM WITH THIS PREVIOUSLY  . Genetic testing 09/05/2017   Breast/GYN panel (23 genes) @ [facility] - No pathogenic mutations detected  . GERD (gastroesophageal reflux disease)    OCC- NO MEDS  . Malignant neoplasm of upper-outer quadrant of right breast in female, estrogen receptor positive ([place]) 2019   [facility] and DCIS  . Personal history of chemotherapy     SURGICAL HISTORY: Past Surgical History:  Procedure Laterality Date  . BREAST BIOPSY Right 06/20/2017   Invasive Mammary Carcinoma  . BREAST EXCISIONAL BIOPSY Right 12/29/2017   RESIDUAL INVASIVE MAMMARY CARCINOMA and DCIS  . BREAST LUMPECTOMY Right 12/29/2017   needle localization and sentinel node injection  . NO PAST SURGERIES    . PARTIAL MASTECTOMY WITH NEEDLE LOCALIZATION Right 12/29/2017   Procedure: PARTIAL MASTECTOMY WITH NEEDLE LOCALIZATION;  Surgeon: [name], MD;  Location: [facility] ORS;  Service: General;  Laterality: Right;  . PORTACATH PLACEMENT  N/A 07/11/2017   Procedure: INSERTION PORT-A-CATH;  Surgeon: Leonie Green, MD;  Location: [facility] ORS;  Service: General;  Laterality: N/A;  . SENTINEL NODE BIOPSY Right 12/29/2017   Procedure: SENTINEL NODE BIOPSY;  Surgeon: [name], MD;  Location: [facility] ORS;  Service: General;  Laterality: Right;    SOCIAL HISTORY: Social History   Socioeconomic History  . Marital status: Married    Spouse name: Not on file  . Number of children: Not on file  . Years of education: Not on file  . Highest education level: Not on file  Occupational History  . Not on file  Social Needs  . Financial resource strain: Not on file  . Food insecurity    Worry: Not on file    Inability: Not on file  . Transportation needs    Medical: Not on file    Non-medical: Not on file  Tobacco Use  . Smoking status: Never Smoker  . Smokeless tobacco: Never Used  Substance and Sexual Activity  . Alcohol use: No    Alcohol/week: 0.0 standard drinks  . Drug use: No  . Sexual activity: Yes  Lifestyle  . Physical activity    Days per week: Not on file    Minutes per session: Not on file  . Stress: Not on file  Relationships  . Social Herbalist on phone: Not on file    Gets together: Not on file    Attends religious service: Not on file  Active member of club or organization: Not on file    Attends meetings of clubs or organizations: Not on file    Relationship status: Not on file  . Intimate partner violence    Fear of current or ex partner: Not on file    Emotionally abused: Not on file    Physically abused: Not on file    Forced sexual activity: Not on file  Other Topics Concern  . Not on file  Social History Narrative  . Not on file    FAMILY HISTORY: Family History  Problem Relation Age of Onset  . Breast cancer Maternal Aunt 13       currently late 35s  . Breast cancer Maternal Grandmother 68       second breast vs. recurrence ca at 53; deceased at 1  . Colon  cancer Maternal Uncle 56       deceased in 53s  . Breast cancer Paternal Aunt        age at dx unknown; currently in 71s  . Breast cancer Paternal Grandmother        age at dx unknown  . Melanoma Mother        on leg  . Testicular cancer Maternal Uncle 23       currently 55s    ALLERGIES:  is allergic to codeine.  MEDICATIONS:  Current Outpatient Medications  Medication Sig Dispense Refill  . acetaminophen (TYLENOL) 325 MG tablet Take 650 mg by mouth every 6 (six) hours as needed for moderate pain or headache.     . Calcium Carbonate-Vit D-Min (CALCIUM 1200 PO) Take 1 tablet by mouth daily.    . cholecalciferol (VITAMIN D) 1000 units tablet Take 1,000 Units by mouth daily.    Marland Kitchen ELDERBERRY PO Take 1 tablet by mouth daily.    Marland Kitchen exemestane (AROMASIN) 25 MG tablet TAKE 1 TABLET (25 MG TOTAL) BY MOUTH DAILY AFTER BREAKFAST. 90 tablet 3  . lidocaine-prilocaine (EMLA) cream APPLY TO AFFECTED AREA ONCE 30 g 3  . loratadine (CLARITIN) 10 MG tablet Take 10 mg by mouth daily as needed for allergies.     . Multiple Vitamin (MULTI-VITAMINS) TABS Take 1 tablet by mouth daily.      No current facility-administered medications for this visit.    Facility-Administered Medications Ordered in Other Visits  Medication Dose Route Frequency Provider Last Rate Last Dose  . heparin lock flush 100 unit/mL  500 Units Intravenous Once [name], MD      . sodium chloride flush (NS) 0.9 % injection 10 mL  10 mL Intravenous PRN [name], MD   10 mL at 08/01/17 5320     PHYSICAL EXAMINATION: ECOG PERFORMANCE STATUS: 1 - Symptomatic but completely ambulatory Vitals:   04/27/19 1323  BP: 123/80  Pulse: 92  Resp: 18  Temp: 97.7 F (36.5 C)   Filed Weights   04/27/19 1323  Weight: 160 lb 8 oz (72.8 kg)    Physical Exam  Constitutional: She is oriented to person, place, and time. No distress.  HENT:  Head: Normocephalic and atraumatic.  Nose: Nose normal.  Mouth/Throat: Oropharynx is clear and  moist. No oropharyngeal exudate.  Eyes: Pupils are equal, round, and reactive to light. EOM are normal. No scleral icterus.  Neck: Normal range of motion. Neck supple. No JVD present.  Cardiovascular: Normal rate, regular rhythm, normal heart sounds and intact distal pulses. Exam reveals no friction rub.  No murmur heard. Pulmonary/Chest: Effort normal and breath sounds  normal. No respiratory distress. She has no rales. She exhibits no tenderness.  Abdominal: Soft. Bowel sounds are normal. She exhibits no distension. There is no abdominal tenderness.  Musculoskeletal: Normal range of motion.        General: No tenderness, deformity or edema.  Lymphadenopathy:    She has no cervical adenopathy.  Neurological: She is alert and oriented to person, place, and time. No cranial nerve deficit. She exhibits normal muscle tone. Coordination normal.  Skin: Skin is warm and dry. She is not diaphoretic. No erythema.  Psychiatric: Affect normal.     Lab Results  Component Value Date   WBC 7.7 04/27/2019   HGB 12.9 04/27/2019   HCT 38.7 04/27/2019   MCV 88.0 04/27/2019   PLT 269 04/27/2019   Recent Labs    10/16/18 0948 01/19/19 1029 04/27/19 1255  NA 137 138 139  K 4.0 4.0 3.9  CL 103 101 103  CO2 '28 28 28  ' GLUCOSE 92 97 96  BUN '14 15 16  ' CREATININE 0.57 0.53 0.60  CALCIUM 9.2 9.4 9.2  GFRNONAA >60 >60 >60  GFRAA >60 >60 >60  PROT 7.5 7.6 7.3  ALBUMIN 4.4 4.5 4.3  AST '18 18 21  ' ALT '11 12 15  ' ALKPHOS 69 71 68  BILITOT 0.5 0.6 0.5    2D Echo was done which showed LVEF 55-65%, mild mitral valve regurgitation.  12/29/2017  Pathology Surgical Pathology  CASE: [id number]  DIAGNOSIS:  A. BREAST MASS, RIGHT; NEEDLE LOCALIZED LUMPECTOMY:  - RESIDUAL INVASIVE MAMMARY CARCINOMA, STATUS POST NEOADJUVANT CHEMOTHERAPY.  - DUCTAL CARCINOMA IN SITU.  - SEE CANCER SUMMARY BELOW.  - PRIOR BIOPSY SITE CHANGE WITH CLIP.   B. SENTINEL LYMPH NODE 1, RIGHT; EXCISION:  - ONE LYMPH NODE  WITH FOCUS SUSPICIOUS FOR ISOLATED TUMOR CELLS.  - TWO LYMPH NODES NEGATIVE FOR MALIGNANCY.  - SEE COMMENT.   C. SENTINEL LYMPH NODE 2, RIGHT; EXCISION:  - ONE LYMPH NODE NEGATIVE FOR MALIGNANCY (0/1).   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  Margins:  Invasive Carcinoma Margins: Uninvolved by invasive carcinoma  Distance from closest margin: 2 mm  Specify closest margin: Superior  DCIS Margins: Uninvolved by DCIS  Distance from closest margin: 3 mm  Specify closest margin: Superior  Regional Lymph Nodes: Involved by tumor cells       Number of Lymph Nodes with Macrometastases (>2 mm): 0       Number of Lymph Nodes with Micrometastases (>0.2 mm to 2 mm  or >200 cells): 0  Number of Lymph Nodes with Isolated Tumor Cells (=0.2 mm or =200 cells): 1  Size of Largest Metastatic Deposit: Less than 0.2 mm  Extranodal Extension: Not identified  Number of Lymph Nodes Examined: 4  Number of Sentinel Nodes Examined: 4  Treatment Effect:  Treatment Effect in the Breast: Definite response to presurgical therapy in the invasive carcinoma  Treatment Effect in the Lymph Nodes: Suspicious for isolated tumor cells, no prominent fibrous scarring in the nodes  Residual Cancer Burden (RCB) from MD [place] calculator (website below):  Primary Tumor Bed  Primary tumor bed: 44 mm x 22  mm  Overall cancer cellularity: 10 %  Percentage of cancer that is in situ: 1 %  Lymph nodes  Number of lymph nodes positive for metastasis: 0  Residual Cancer Burden: 1.695  Residual Cancer Burden  Class: RCB-II  [email or web address]  Lymphovascular Invasion: Present  Pathologic Stage Classification (pTNM, AJCC 8th Edition): ypT2 ypN0 (i+) (sn)  TNM Descriptors: y - neoadjuvant   BREAST BIOMARKER TESTS - performed on prior biopsy  Estrogen Receptor (ER) Status: POSITIVE, >90% nuclear staining  Progesterone Receptor (PgR) Status: POSITIVE, >90% nuclear staining  HER2 (by immunohistochemistry): EQUIVOCAL  HER2 (ERBB2) (by in situ hybridization): NEGATIVE    RADIOGRAPHIC STUDIES: I have personally reviewed the radiological images as listed and agreed with the findings in the report. 06/22/2018  Diagnostic mammogram bilateral no evidence of malignancy within either breast.  ASSESSMENT & PLAN:  1. Malignant neoplasm of upper-outer quadrant of right breast in female, estrogen receptor positive ([place])   2. Aromatase inhibitor use   3. Port-A-Cath in place   4. Suppression of ovarian secretion   Cancer Staging Malignant neoplasm of upper-outer quadrant of right breast in female, estrogen receptor positive ([place]) Staging form: Breast, AJCC 8th Edition - Clinical stage from 07/15/2017: Stage IB (cT2, cN0, cM0, G2, ER: Positive, PR: Positive, HER2: Negative) - Signed by [name], MD on 07/15/2017 #ypT2 ypN0 (i+) (sn), grade 2 breast cancer, ER/PR positive, HER 2 negative. Patient is clinically doing well.  Diagnostic mammogram done in December 2019, due in December 2020. Patient usually get mammogram done through her surgeon's office.  I asked patient to call me if she does not get scheduled and I give her prescription. . Labs are reviewed and discussed with patient.  She is clinically doing well. Continue Aromasin. Ovarian suppression , tolerating Zoladex monthly.  Proceed with Zoladex today Folliculitis of her left breast has resolved.  Leaving a small hyperpigmentation spot.  Continue to monitor.  Port-A-Cath in place.  Continue port flush  every 6 to 8 weeks.  We discussed about possible discontinuing of Mediport after her next mammogram.   #Chronic aromatase inhibitor use:  July 2020 DEXA shows osteopenia. Recommend patient to continue calcium and vitamin D supplementation. I recommend adjuvant bisphosphonate with Zometa every 6 months for 2 years to reduce skeletal events, prevent recurrence to the bone.  Improved survival. Also osteopenia will benefit from Zometa Patient will consider and update me. We have also gotten written notice from her dentist that she is cleared to proceed with Zometa.   We spent sufficient time to discuss many aspect of care, questions were answered to patient's satisfaction. The patient knows to call the clinic with any problems questions or concerns.  Return visit: She will follow-up monthly for Zoladex injections, obtain CBC, CMP and follow-up in the clinic in 3 months   [name], MD, PhD Hematology [place] at [facility] Pager- [phone] 04/27/2019

## 2019-04-27 NOTE — Progress Notes (Signed)
Patient here for follow up. No concerns voiced.  °

## 2019-05-05 ENCOUNTER — Encounter: Payer: Self-pay | Admitting: Oncology

## 2019-05-10 ENCOUNTER — Other Ambulatory Visit: Payer: Self-pay | Admitting: Oncology

## 2019-05-10 ENCOUNTER — Telehealth: Payer: Self-pay

## 2019-05-10 DIAGNOSIS — C50411 Malignant neoplasm of upper-outer quadrant of right female breast: Secondary | ICD-10-CM

## 2019-05-10 DIAGNOSIS — Z1231 Encounter for screening mammogram for malignant neoplasm of breast: Secondary | ICD-10-CM

## 2019-05-10 DIAGNOSIS — Z170 Estrogen receptor positive status [ER+]: Secondary | ICD-10-CM

## 2019-05-10 DIAGNOSIS — Z853 Personal history of malignant neoplasm of breast: Secondary | ICD-10-CM

## 2019-05-10 NOTE — Telephone Encounter (Signed)
Patient would like to initiate Zometa as discussed at last MD visit.  Dental clearance in media tab on 01/18/2019.

## 2019-05-10 NOTE — Telephone Encounter (Signed)
   Done...*New* Zometa added to her 05/28/19 visit

## 2019-05-10 NOTE — Telephone Encounter (Signed)
Pt informed via MyChart

## 2019-05-10 NOTE — Telephone Encounter (Signed)
She can have it on 05/28/2019. Please have her do BMP the same day.

## 2019-05-10 NOTE — Telephone Encounter (Signed)
LC, Can we get lab and *new* Zometa added to the already scheduled appt on 05/28/19?

## 2019-05-20 ENCOUNTER — Encounter: Payer: Self-pay | Admitting: Oncology

## 2019-05-21 ENCOUNTER — Inpatient Hospital Stay: Payer: 59

## 2019-05-24 ENCOUNTER — Other Ambulatory Visit: Payer: Self-pay | Admitting: General Surgery

## 2019-05-24 DIAGNOSIS — Z853 Personal history of malignant neoplasm of breast: Secondary | ICD-10-CM

## 2019-05-25 ENCOUNTER — Ambulatory Visit: Payer: 59

## 2019-05-27 ENCOUNTER — Encounter: Payer: Self-pay | Admitting: Oncology

## 2019-05-28 ENCOUNTER — Other Ambulatory Visit: Payer: Self-pay

## 2019-05-28 ENCOUNTER — Inpatient Hospital Stay: Payer: 59

## 2019-05-28 ENCOUNTER — Inpatient Hospital Stay: Payer: 59 | Attending: Oncology

## 2019-05-28 VITALS — BP 113/78 | HR 85 | Temp 98.3°F | Resp 16

## 2019-05-28 DIAGNOSIS — Z79811 Long term (current) use of aromatase inhibitors: Secondary | ICD-10-CM | POA: Diagnosis not present

## 2019-05-28 DIAGNOSIS — C50411 Malignant neoplasm of upper-outer quadrant of right female breast: Secondary | ICD-10-CM | POA: Insufficient documentation

## 2019-05-28 DIAGNOSIS — J45909 Unspecified asthma, uncomplicated: Secondary | ICD-10-CM | POA: Insufficient documentation

## 2019-05-28 DIAGNOSIS — Z170 Estrogen receptor positive status [ER+]: Secondary | ICD-10-CM | POA: Diagnosis not present

## 2019-05-28 DIAGNOSIS — Z79899 Other long term (current) drug therapy: Secondary | ICD-10-CM | POA: Insufficient documentation

## 2019-05-28 DIAGNOSIS — Z95828 Presence of other vascular implants and grafts: Secondary | ICD-10-CM

## 2019-05-28 DIAGNOSIS — Z9221 Personal history of antineoplastic chemotherapy: Secondary | ICD-10-CM | POA: Insufficient documentation

## 2019-05-28 DIAGNOSIS — L739 Follicular disorder, unspecified: Secondary | ICD-10-CM | POA: Diagnosis not present

## 2019-05-28 LAB — BASIC METABOLIC PANEL
Anion gap: 6 (ref 5–15)
BUN: 22 mg/dL — ABNORMAL HIGH (ref 6–20)
CO2: 29 mmol/L (ref 22–32)
Calcium: 9.1 mg/dL (ref 8.9–10.3)
Chloride: 102 mmol/L (ref 98–111)
Creatinine, Ser: 0.52 mg/dL (ref 0.44–1.00)
GFR calc Af Amer: >60 mL/min (ref >60)
GFR calc non Af Amer: >60 mL/min (ref >60)
Glucose, Bld: 104 mg/dL — ABNORMAL HIGH (ref 70–99)
Potassium: 3.8 mmol/L (ref 3.5–5.1)
Sodium: 137 mmol/L (ref 135–145)

## 2019-05-28 MED ORDER — HEPARIN SOD (PORK) LOCK FLUSH 100 UNIT/ML IV SOLN
INTRAVENOUS | Status: AC
  Filled 2019-05-28: qty 5

## 2019-05-28 MED ORDER — GOSERELIN ACETATE 3.6 MG ~~LOC~~ IMPL
3.6000 mg | DRUG_IMPLANT | Freq: Once | SUBCUTANEOUS | Status: AC
  Administered 2019-05-28: 3.6 mg via SUBCUTANEOUS
  Filled 2019-05-28: qty 3.6

## 2019-05-28 MED ORDER — HEPARIN SOD (PORK) LOCK FLUSH 100 UNIT/ML IV SOLN
500.0000 [IU] | Freq: Once | INTRAVENOUS | Status: AC | PRN
  Administered 2019-05-28: 500 [IU]

## 2019-05-28 MED ORDER — ZOLEDRONIC ACID 4 MG/100ML IV SOLN
4.0000 mg | Freq: Once | INTRAVENOUS | Status: AC
  Administered 2019-05-28: 4 mg via INTRAVENOUS
  Filled 2019-05-28: qty 100

## 2019-05-28 MED ORDER — SODIUM CHLORIDE 0.9% FLUSH
10.0000 mL | Freq: Once | INTRAVENOUS | Status: AC
  Administered 2019-05-28: 10 mL via INTRAVENOUS
  Filled 2019-05-28: qty 10

## 2019-05-28 MED ORDER — SODIUM CHLORIDE 0.9 % IV SOLN
Freq: Once | INTRAVENOUS | Status: AC
  Administered 2019-05-28: 14:00:00 via INTRAVENOUS
  Filled 2019-05-28: qty 250

## 2019-06-18 ENCOUNTER — Inpatient Hospital Stay: Payer: 59

## 2019-06-21 ENCOUNTER — Encounter: Payer: Self-pay | Admitting: Oncology

## 2019-06-24 ENCOUNTER — Ambulatory Visit
Admission: RE | Admit: 2019-06-24 | Discharge: 2019-06-24 | Disposition: A | Discharge status: Home / Self Care | Payer: 59 | Source: Ambulatory Visit | Attending: General Surgery | Admitting: General Surgery

## 2019-06-24 DIAGNOSIS — Z853 Personal history of malignant neoplasm of breast: Secondary | ICD-10-CM | POA: Diagnosis present

## 2019-06-24 HISTORY — DX: Personal history of irradiation: Z92.3

## 2019-06-28 ENCOUNTER — Other Ambulatory Visit: Payer: Self-pay

## 2019-06-28 ENCOUNTER — Inpatient Hospital Stay: Payer: 59 | Attending: Oncology

## 2019-06-28 DIAGNOSIS — Z9221 Personal history of antineoplastic chemotherapy: Secondary | ICD-10-CM | POA: Diagnosis not present

## 2019-06-28 DIAGNOSIS — C50411 Malignant neoplasm of upper-outer quadrant of right female breast: Secondary | ICD-10-CM | POA: Diagnosis present

## 2019-06-28 DIAGNOSIS — Z79899 Other long term (current) drug therapy: Secondary | ICD-10-CM | POA: Insufficient documentation

## 2019-06-28 DIAGNOSIS — Z79811 Long term (current) use of aromatase inhibitors: Secondary | ICD-10-CM | POA: Diagnosis not present

## 2019-06-28 DIAGNOSIS — Z170 Estrogen receptor positive status [ER+]: Secondary | ICD-10-CM | POA: Insufficient documentation

## 2019-06-28 MED ORDER — GOSERELIN ACETATE 3.6 MG ~~LOC~~ IMPL
3.6000 mg | DRUG_IMPLANT | Freq: Once | SUBCUTANEOUS | Status: AC
  Administered 2019-06-28: 3.6 mg via SUBCUTANEOUS
  Filled 2019-06-28: qty 3.6

## 2019-07-27 NOTE — Progress Notes (Signed)
Called patient no answer left message  

## 2019-07-28 ENCOUNTER — Encounter: Payer: Self-pay | Admitting: Oncology

## 2019-07-28 ENCOUNTER — Inpatient Hospital Stay: Payer: 59

## 2019-07-28 ENCOUNTER — Other Ambulatory Visit: Payer: Self-pay

## 2019-07-28 ENCOUNTER — Inpatient Hospital Stay: Payer: 59 | Attending: Oncology | Admitting: Oncology

## 2019-07-28 VITALS — BP 112/76 | HR 87 | Temp 98.0°F | Resp 18 | Wt 154.4 lb

## 2019-07-28 DIAGNOSIS — Z170 Estrogen receptor positive status [ER+]: Secondary | ICD-10-CM

## 2019-07-28 DIAGNOSIS — Z95828 Presence of other vascular implants and grafts: Secondary | ICD-10-CM

## 2019-07-28 DIAGNOSIS — C50411 Malignant neoplasm of upper-outer quadrant of right female breast: Secondary | ICD-10-CM

## 2019-07-28 DIAGNOSIS — M8580 Other specified disorders of bone density and structure, unspecified site: Secondary | ICD-10-CM | POA: Insufficient documentation

## 2019-07-28 DIAGNOSIS — Z79899 Other long term (current) drug therapy: Secondary | ICD-10-CM | POA: Diagnosis not present

## 2019-07-28 LAB — CBC WITH DIFFERENTIAL/PLATELET
Abs Immature Granulocytes: 0.02 10*3/uL (ref 0.00–0.07)
Basophils Absolute: 0 10*3/uL (ref 0.0–0.1)
Basophils Relative: 1 %
Eosinophils Absolute: 0.1 10*3/uL (ref 0.0–0.5)
Eosinophils Relative: 2 %
HCT: 39 % (ref 36.0–46.0)
Hemoglobin: 12.6 g/dL (ref 12.0–15.0)
Immature Granulocytes: 0 %
Lymphocytes Relative: 30 %
Lymphs Abs: 1.9 10*3/uL (ref 0.7–4.0)
MCH: 29 pg (ref 26.0–34.0)
MCHC: 32.3 g/dL (ref 30.0–36.0)
MCV: 89.7 fL (ref 80.0–100.0)
Monocytes Absolute: 0.3 10*3/uL (ref 0.1–1.0)
Monocytes Relative: 5 %
Neutro Abs: 4.1 10*3/uL (ref 1.7–7.7)
Neutrophils Relative %: 62 %
Platelets: 252 10*3/uL (ref 150–400)
RBC: 4.35 MIL/uL (ref 3.87–5.11)
RDW: 11.2 % — ABNORMAL LOW (ref 11.5–15.5)
WBC: 6.5 10*3/uL (ref 4.0–10.5)
nRBC: 0 % (ref 0.0–0.2)

## 2019-07-28 LAB — COMPREHENSIVE METABOLIC PANEL
ALT: 12 U/L (ref 0–44)
AST: 19 U/L (ref 15–41)
Albumin: 4.4 g/dL (ref 3.5–5.0)
Alkaline Phosphatase: 54 U/L (ref 38–126)
Anion gap: 8 (ref 5–15)
BUN: 21 mg/dL — ABNORMAL HIGH (ref 6–20)
CO2: 27 mmol/L (ref 22–32)
Calcium: 9.2 mg/dL (ref 8.9–10.3)
Chloride: 100 mmol/L (ref 98–111)
Creatinine, Ser: 0.53 mg/dL (ref 0.44–1.00)
GFR calc Af Amer: >60 mL/min (ref >60)
GFR calc non Af Amer: >60 mL/min (ref >60)
Glucose, Bld: 126 mg/dL — ABNORMAL HIGH (ref 70–99)
Potassium: 3.9 mmol/L (ref 3.5–5.1)
Sodium: 135 mmol/L (ref 135–145)
Total Bilirubin: 0.3 mg/dL (ref 0.3–1.2)
Total Protein: 7.2 g/dL (ref 6.5–8.1)

## 2019-07-28 MED ORDER — HEPARIN SOD (PORK) LOCK FLUSH 100 UNIT/ML IV SOLN
500.0000 [IU] | Freq: Once | INTRAVENOUS | Status: AC
  Administered 2019-07-28: 13:00:00 500 [IU] via INTRAVENOUS
  Filled 2019-07-28: qty 5

## 2019-07-28 MED ORDER — SODIUM CHLORIDE 0.9% FLUSH
10.0000 mL | Freq: Once | INTRAVENOUS | Status: AC
  Administered 2019-07-28: 10 mL via INTRAVENOUS
  Filled 2019-07-28: qty 10

## 2019-07-28 MED ORDER — GOSERELIN ACETATE 3.6 MG ~~LOC~~ IMPL
3.6000 mg | DRUG_IMPLANT | Freq: Once | SUBCUTANEOUS | Status: AC
  Administered 2019-07-28: 3.6 mg via SUBCUTANEOUS
  Filled 2019-07-28: qty 3.6

## 2019-07-28 NOTE — Progress Notes (Signed)
Patient here for follow up. No concerns, no new breast issues.

## 2019-07-29 NOTE — Progress Notes (Signed)
Hematology/Oncology Follow up note [facility] Telephone:([phone] [phone]   Patient Care Team: [name], NP as PCP - General (Nurse Practitioner) Theodore Demark, RN as Oncology Nurse Navigator [name], [name], MD as Referring Physician (Surgery) [name], MD as Consulting Physician (Oncology) [name], MD as Consulting Physician (General Surgery) [name], MD as Referring Physician (Radiation Oncology)  REASON FOR VISIT Follow up for management of breast cancer   HISTORY OF PRESENTING ILLNESS:  Anne Wu is a  39 y.o.  female with diagnosis of cT2N0M0, grade 2 invasive mammary carcinoma.  She has significant family history on both maternal side as well as paternal side. She felt a right breast mass 2 months ago and diagnostic mammogram showed right upper quandrant 2.2 cm mass, which was confirmed on [place] and biopsied. Right axillary no clinically suspicious lymph node. Pathology showed invasive mammary carcinoma, ER/PR positive,, HER2 IHC  equivocal, FISH negative.   # Patient was evaluated by Dr.Smith. From surgical standpoint, Dr.Smith recommend neoadjuvant chemotherapy to facilitate surgery to obtain negative margin and better surgical outcome. Also given patient's young age, neoadjuvant chemotherapy is reasonable.    #Patient stated that she does not desire future fertility.  # 2D Echo showed normal systolic function, mild mitral regurgitation.  # CT was done  to rule out metastatic disease given micrometastasis. Images were independently reviewed by me and discussed with patient.  Negative for metastatic disease. # Testing did not reveal any pathogenic mutation in any of these genes. A copy of the genetic test report will be scanned into Epic under the Media tab.The genes analyzed were the 23 genes on [facility]'s Breast/GYN panel (ATM, BARD1, BRCA1, BRCA2, BRIP1, CDH1, CHEK2, DICER1, EPCAM, MLH1,  MSH2, MSH6,  NBN, NF1, PALB2, PMS2, PTEN, RAD50, RAD51C, RAD51D,SMARCA4, STK11, and TP53). 09/08/2017 Interval mammogram after 4 cycle of AC, showed good treatment response. The biopsy-proven malignancy in the upper outer quadrant of the right breast at posterior depth, associated with scattered microcalcifications and architectural distortion, has significantly decreased in size since the mammogram 06/20/2017. On today's mammogram, the mass measures approximately 1.7 x 2.2 x 1.2 cm (previously 2.4 x 1.6 x 2.7 cm).   Treatment Summary :  Jan 2019- May 2019 Neoadjuvant AC-->T  12/29/2017 Right lumpectomy and sentinel LN biopsy Pathology showed ypT2 ypN0 (i+) (sn) case was discussed on tumor board.ER/PR positive, HER 2 negative.  Aug 2019-September 2019 Adjuvant RT  Oct 2019 to start Zoladex and Aromasin.   INTERVAL HISTORY Anne Wu is a 39 y.o. female who has above history reviewed by me presents for follow up for breast cancer management  Patient takes Aromasin daily.  Tolerating well with manageable aches and pains. Patient is also on ovarian suppression with Zoladex monthly.   Patient reports tolerating Zoladex and Aromasin.  She does not have any new complaints of her breast today.  She reports feeling teeth are shifting, and sometimes she has some pain while chewing.  She also has gingivitis.  Denies fever, chills, nausea, vomiting, diarrhea, chest pain, shortness of breath, abdominal pain, urinary symptoms, lower extremity swelling.      Review of Systems  Constitutional: Negative for appetite change, chills, fatigue and fever.  HENT:   Negative for hearing loss and voice change.   Eyes: Negative for eye problems.  Respiratory: Negative for chest tightness and cough.   Cardiovascular: Negative for chest pain.  Gastrointestinal: Negative for abdominal distention, abdominal pain and blood in stool.  Endocrine: Positive for  hot flashes.  Genitourinary: Negative for difficulty urinating and  frequency.   Musculoskeletal: Positive for arthralgias.  Skin: Negative for itching and rash.  Neurological: Negative for dizziness, extremity weakness and light-headedness.  Hematological: Negative for adenopathy.  Psychiatric/Behavioral: Negative for confusion. The patient is not nervous/anxious.     MEDICAL HISTORY:  Past Medical History:  Diagnosis Date  . Asthma    AS A CHILD-NO INHALERS  . Breast cancer ([place]) 2018   Right breast cancer-[facility]  . Elevated blood pressure, situational    PT STATES HER LAST COUPLE OF MD APPOINTMENTS SHE HAS HAD ELEVATED BP-NEVER HAD A PROBLEM WITH THIS PREVIOUSLY  . Genetic testing 09/05/2017   Breast/GYN panel (23 genes) @ [facility] - No pathogenic mutations detected  . GERD (gastroesophageal reflux disease)    OCC- NO MEDS  . Malignant neoplasm of upper-outer quadrant of right breast in female, estrogen receptor positive ([place]) 2019   [facility] and [facility]  . Personal history of chemotherapy   . Personal history of radiation therapy     SURGICAL HISTORY: Past Surgical History:  Procedure Laterality Date  . BREAST BIOPSY Right 06/20/2017   Invasive Mammary Carcinoma  . BREAST EXCISIONAL BIOPSY Right 12/29/2017   RESIDUAL INVASIVE MAMMARY CARCINOMA and DCIS  . BREAST LUMPECTOMY Right 12/29/2017   needle localization and sentinel node injection  . NO PAST SURGERIES    . PARTIAL MASTECTOMY WITH NEEDLE LOCALIZATION Right 12/29/2017   Procedure: PARTIAL MASTECTOMY WITH NEEDLE LOCALIZATION;  Surgeon: [name], MD;  Location: [facility] ORS;  Service: General;  Laterality: Right;  . PORTACATH PLACEMENT N/A 07/11/2017   Procedure: INSERTION PORT-A-CATH;  Surgeon: Leonie Green, MD;  Location: [facility] ORS;  Service: General;  Laterality: N/A;  . SENTINEL NODE BIOPSY Right 12/29/2017   Procedure: SENTINEL NODE BIOPSY;  Surgeon: [name], MD;  Location: [facility] ORS;  Service: General;  Laterality: Right;    SOCIAL HISTORY: Social History    Socioeconomic History  . Marital status: Married    Spouse name: Not on file  . Number of children: Not on file  . Years of education: Not on file  . Highest education level: Not on file  Occupational History  . Not on file  Tobacco Use  . Smoking status: Never Smoker  . Smokeless tobacco: Never Used  Substance and Sexual Activity  . Alcohol use: No    Alcohol/week: 0.0 standard drinks  . Drug use: No  . Sexual activity: Yes  Other Topics Concern  . Not on file  Social History Narrative  . Not on file   Social Determinants of Health   Financial Resource Strain:   . Difficulty of Paying Living Expenses: Not on file  Food Insecurity:   . Worried About Charity fundraiser in the Last Year: Not on file  . Ran Out of Food in the Last Year: Not on file  Transportation Needs:   . Lack of Transportation (Medical): Not on file  . Lack of Transportation (Non-Medical): Not on file  Physical Activity:   . Days of Exercise per Week: Not on file  . Minutes of Exercise per Session: Not on file  Stress:   . Feeling of Stress : Not on file  Social Connections:   . Frequency of Communication with Friends and Family: Not on file  . Frequency of Social Gatherings with Friends and Family: Not on file  . Attends Religious Services: Not on file  . Active Member of Clubs or Organizations: Not on file  .  Attends Archivist Meetings: Not on file  . Marital Status: Not on file  Intimate Partner Violence:   . Fear of Current or Ex-Partner: Not on file  . Emotionally Abused: Not on file  . Physically Abused: Not on file  . Sexually Abused: Not on file    FAMILY HISTORY: Family History  Problem Relation Age of Onset  . Breast cancer Maternal Aunt 54       currently late 67s  . Breast cancer Maternal Grandmother 68       second breast vs. recurrence ca at 36; deceased at 98  . Colon cancer Maternal Uncle 31       deceased in 22s  . Breast cancer Paternal Aunt        age at  dx unknown; currently in 42s  . Breast cancer Paternal Grandmother        age at dx unknown  . Melanoma Mother        on leg  . Testicular cancer Maternal Uncle 67       currently 17s    ALLERGIES:  is allergic to codeine.  MEDICATIONS:  Current Outpatient Medications  Medication Sig Dispense Refill  . acetaminophen (TYLENOL) 325 MG tablet Take 650 mg by mouth every 6 (six) hours as needed for moderate pain or headache.     . Calcium Carbonate-Vit D-Min (CALCIUM 1200 PO) Take 1 tablet by mouth daily.    . cholecalciferol (VITAMIN D) 1000 units tablet Take 1,000 Units by mouth daily.    Marland Kitchen ELDERBERRY PO Take 1 tablet by mouth daily.    Marland Kitchen exemestane (AROMASIN) 25 MG tablet TAKE 1 TABLET (25 MG TOTAL) BY MOUTH DAILY AFTER BREAKFAST. 90 tablet 3  . lidocaine-prilocaine (EMLA) cream APPLY TO AFFECTED AREA ONCE 30 g 3  . Multiple Vitamin (MULTI-VITAMINS) TABS Take 1 tablet by mouth daily.     Marland Kitchen loratadine (CLARITIN) 10 MG tablet Take 10 mg by mouth daily as needed for allergies.      No current facility-administered medications for this visit.   Facility-Administered Medications Ordered in Other Visits  Medication Dose Route Frequency Provider Last Rate Last Admin  . heparin lock flush 100 unit/mL  500 Units Intravenous Once [name], MD      . sodium chloride flush (NS) 0.9 % injection 10 mL  10 mL Intravenous PRN [name], MD   10 mL at 08/01/17 0829     PHYSICAL EXAMINATION: ECOG PERFORMANCE STATUS: 1 - Symptomatic but completely ambulatory Vitals:   07/28/19 1341  BP: 112/76  Pulse: 87  Resp: 18  Temp: 98 F (36.7 C)   Filed Weights   07/28/19 1341  Weight: 154 lb 6.4 oz (70 kg)    Physical Exam  Constitutional: She is oriented to person, place, and time. No distress.  HENT:  Head: Normocephalic and atraumatic.  Nose: Nose normal.  Mouth/Throat: Oropharynx is clear and moist. No oropharyngeal exudate.  Eyes: Pupils are equal, round, and reactive to light. EOM are  normal. No scleral icterus.  Neck: No JVD present.  Cardiovascular: Normal rate, regular rhythm, normal heart sounds and intact distal pulses. Exam reveals no friction rub.  No murmur heard. Pulmonary/Chest: Effort normal and breath sounds normal. No respiratory distress. She has no rales. She exhibits no tenderness.  Abdominal: Soft. Bowel sounds are normal. She exhibits no distension. There is no abdominal tenderness.  Musculoskeletal:        General: No tenderness, deformity or edema. Normal range  of motion.     Cervical back: Normal range of motion and neck supple.  Lymphadenopathy:    She has no cervical adenopathy.  Neurological: She is alert and oriented to person, place, and time. No cranial nerve deficit. She exhibits normal muscle tone. Coordination normal.  Skin: Skin is warm and dry. She is not diaphoretic. No erythema.  Psychiatric: Affect normal.     Lab Results  Component Value Date   WBC 6.5 07/28/2019   HGB 12.6 07/28/2019   HCT 39.0 07/28/2019   MCV 89.7 07/28/2019   PLT 252 07/28/2019   Recent Labs    01/19/19 1029 01/19/19 1029 04/27/19 1255 05/28/19 1332 07/28/19 1310  NA 138   < > 139 137 135  K 4.0   < > 3.9 3.8 3.9  CL 101   < > 103 102 100  CO2 28   < > _0 GLUCOSE 97   < > 96 104* 126*  BUN 15   < > 16 22* 21*  CREATININE 0.53   < > 0.60 0.52 0.53  CALCIUM 9.4   < > 9.2 9.1 9.2  GFRNONAA >60   < > >60 >60 >60  GFRAA >60   < > >60 >60 >60  PROT 7.6  --  7.3  --  7.2  ALBUMIN 4.5  --  4.3  --  4.4  AST 18  --  21  --  19  ALT 12  --  15  --  12  ALKPHOS 71  --  68  --  54  BILITOT 0.6  --  0.5  --  0.3   < > = values in this interval not displayed.   RADIOGRAPHIC STUDIES: I have personally reviewed the radiological images as listed and agreed with the findings in the report. MM DIAG BREAST TOMO BILATERAL  Result Date: 06/24/2019 CLINICAL DATA:  39 year old patient with history of right breast cancer diagnosed in December 2018. She  presents for routine imaging today. EXAM: DIGITAL DIAGNOSTIC BILATERAL MAMMOGRAM WITH CAD AND TOMO COMPARISON:  Previous exam(s). ACR Breast Density Category b: There are scattered areas of fibroglandular density. FINDINGS: Stable lumpectomy changes in the deep upper outer right breast. No mass, nonsurgical distortion, or suspicious microcalcification is identified in either breast to suggest malignancy. Skin thickening of the right breast is present, but decreased compared to the 2019 mammogram. Mammographic images were processed with CAD. IMPRESSION: No evidence of malignancy in either breast. Lumpectomy and radiation changes on the right. RECOMMENDATION: Diagnostic mammogram is suggested in 1 year. (Code:[id number]) I have discussed the findings and recommendations with the patient. If applicable, a reminder letter will be sent to the patient regarding the next appointment. BI-RADS CATEGORY  2: Benign. Electronically Signed   By: Curlene Dolphin M.D.   On: 06/24/2019 11:54    ASSESSMENT & PLAN:  1. Malignant neoplasm of upper-outer quadrant of right breast in female, estrogen receptor positive ([place])   Cancer Staging Malignant neoplasm of upper-outer quadrant of right breast in female, estrogen receptor positive ([place]) Staging form: Breast, AJCC 8th Edition - Clinical stage from 07/15/2017: Stage IB (cT2, cN0, cM0, G2, ER: Positive, PR: Positive, HER2: Negative) - Signed by [name], MD on 07/15/2017 #ypT2 ypN0 (i+) (sn), grade 2 breast cancer, ER/PR positive, HER 2 negative. Patient is clinically doing well. Diagnostic mammogram done in December 2020 was independently reviewed and discussed with patient.  No evidence of local recurrence or new suspicious findings. Continue Aromasin.  Ovarian suppression, continue Zoladex  Port-A-Cath in place. -Has been more than 2 years since she finishes cancer treatments. Continue port flush every 8 weeks.  We discussed about discontinuation of the port.  Patient will  think about that and let us know. Port was previously placed by Dr. [name].  #Chronic aromatase inhibitor use:  July 2020 DEXA shows osteopenia.  Continue calcium and vitamin D supplementation. Recommend Zometa every 6 months  Patient has received Zometa on 06/14/2019.  She has some dental complaints and I recommend patient to see dentist for further evaluation.  We spent sufficient time to discuss many aspect of care, questions were answered to patient's satisfaction. The patient knows to call the clinic with any problems questions or concerns.  Return visit: She will follow-up monthly for Zoladex injections, obtain CBC, CMP and follow-up in the clinic in 4 months   [name], MD, PhD Hematology Oncology [facility] Pager- [phone] 07/29/2019

## 2019-08-27 ENCOUNTER — Inpatient Hospital Stay: Payer: 59 | Attending: Oncology

## 2019-08-27 ENCOUNTER — Other Ambulatory Visit: Payer: Self-pay

## 2019-08-27 DIAGNOSIS — C50411 Malignant neoplasm of upper-outer quadrant of right female breast: Secondary | ICD-10-CM | POA: Insufficient documentation

## 2019-08-27 DIAGNOSIS — M8580 Other specified disorders of bone density and structure, unspecified site: Secondary | ICD-10-CM | POA: Diagnosis not present

## 2019-08-27 DIAGNOSIS — Z170 Estrogen receptor positive status [ER+]: Secondary | ICD-10-CM | POA: Diagnosis not present

## 2019-08-27 DIAGNOSIS — Z79899 Other long term (current) drug therapy: Secondary | ICD-10-CM | POA: Insufficient documentation

## 2019-08-27 MED ORDER — GOSERELIN ACETATE 3.6 MG ~~LOC~~ IMPL
3.6000 mg | DRUG_IMPLANT | Freq: Once | SUBCUTANEOUS | Status: AC
  Administered 2019-08-27: 15:00:00 3.6 mg via SUBCUTANEOUS
  Filled 2019-08-27: qty 3.6

## 2019-08-31 ENCOUNTER — Encounter: Payer: Self-pay | Admitting: Oncology

## 2019-09-01 ENCOUNTER — Telehealth: Payer: Self-pay

## 2019-09-01 NOTE — Telephone Encounter (Signed)
Patient is being evaluated by orthodontist for possible braces.  Dr. Graylon Good would like your thoughts on patient getting braces, for possibly 2 years, while receiving Zometa.

## 2019-09-03 NOTE — Telephone Encounter (Signed)
Dr. Tasia Catchings has talked toDr. Graylon Good and discussed patient's case. Per Dr. Tasia Catchings "ok to try. Sounds like she has more than just a cosmetic issue. There maybe a risk of osteo jaw necrosis but I can not find much literature about this. So my concensus with Dr.Snyder is this is weigh benefit against risk situation. shared decision making process."

## 2019-09-10 ENCOUNTER — Encounter: Payer: Self-pay | Admitting: Oncology

## 2019-09-24 ENCOUNTER — Inpatient Hospital Stay: Payer: 59 | Attending: Oncology

## 2019-09-24 DIAGNOSIS — C50411 Malignant neoplasm of upper-outer quadrant of right female breast: Secondary | ICD-10-CM | POA: Insufficient documentation

## 2019-09-24 DIAGNOSIS — Z79899 Other long term (current) drug therapy: Secondary | ICD-10-CM | POA: Diagnosis not present

## 2019-09-24 DIAGNOSIS — M8580 Other specified disorders of bone density and structure, unspecified site: Secondary | ICD-10-CM | POA: Diagnosis not present

## 2019-09-24 DIAGNOSIS — Z170 Estrogen receptor positive status [ER+]: Secondary | ICD-10-CM | POA: Diagnosis not present

## 2019-09-24 MED ORDER — GOSERELIN ACETATE 3.6 MG ~~LOC~~ IMPL
3.6000 mg | DRUG_IMPLANT | Freq: Once | SUBCUTANEOUS | Status: AC
  Administered 2019-09-24: 14:00:00 3.6 mg via SUBCUTANEOUS
  Filled 2019-09-24: qty 3.6

## 2019-10-18 NOTE — Progress Notes (Signed)
Pharmacist Chemotherapy Monitoring - Follow Up Assessment    I verify that I have reviewed each item in the below checklist:  . Regimen for the patient is scheduled for the appropriate day and plan matches scheduled date. Marland Kitchen Appropriate non-routine labs are ordered dependent on drug ordered. . If applicable, additional medications reviewed and ordered per protocol based on lifetime cumulative doses and/or treatment regimen.   Plan for follow-up and/or issues identified: No . I-vent associated with next due treatment: No . MD and/or nursing notified: No  Anne Wu K 10/18/2019 11:58 AM

## 2019-10-25 ENCOUNTER — Other Ambulatory Visit: Payer: Self-pay

## 2019-10-25 ENCOUNTER — Inpatient Hospital Stay: Payer: 59 | Attending: Oncology

## 2019-10-25 VITALS — BP 115/78 | HR 99 | Temp 96.6°F | Resp 16

## 2019-10-25 DIAGNOSIS — M8580 Other specified disorders of bone density and structure, unspecified site: Secondary | ICD-10-CM | POA: Diagnosis present

## 2019-10-25 DIAGNOSIS — Z79899 Other long term (current) drug therapy: Secondary | ICD-10-CM | POA: Diagnosis not present

## 2019-10-25 DIAGNOSIS — Z5111 Encounter for antineoplastic chemotherapy: Secondary | ICD-10-CM | POA: Insufficient documentation

## 2019-10-25 DIAGNOSIS — C50411 Malignant neoplasm of upper-outer quadrant of right female breast: Secondary | ICD-10-CM | POA: Diagnosis present

## 2019-10-25 DIAGNOSIS — Z170 Estrogen receptor positive status [ER+]: Secondary | ICD-10-CM

## 2019-10-25 DIAGNOSIS — Z5189 Encounter for other specified aftercare: Secondary | ICD-10-CM | POA: Insufficient documentation

## 2019-10-25 MED ORDER — GOSERELIN ACETATE 3.6 MG ~~LOC~~ IMPL
3.6000 mg | DRUG_IMPLANT | Freq: Once | SUBCUTANEOUS | Status: AC
  Administered 2019-10-25: 3.6 mg via SUBCUTANEOUS
  Filled 2019-10-25: qty 3.6

## 2019-11-24 ENCOUNTER — Other Ambulatory Visit: Payer: Self-pay

## 2019-11-24 DIAGNOSIS — Z170 Estrogen receptor positive status [ER+]: Secondary | ICD-10-CM

## 2019-11-25 ENCOUNTER — Inpatient Hospital Stay (HOSPITAL_BASED_OUTPATIENT_CLINIC_OR_DEPARTMENT_OTHER): Payer: 59 | Admitting: Oncology

## 2019-11-25 ENCOUNTER — Inpatient Hospital Stay: Payer: 59 | Attending: Oncology

## 2019-11-25 ENCOUNTER — Inpatient Hospital Stay: Payer: 59

## 2019-11-25 ENCOUNTER — Other Ambulatory Visit: Payer: Self-pay

## 2019-11-25 ENCOUNTER — Encounter: Payer: Self-pay | Admitting: Oncology

## 2019-11-25 VITALS — BP 115/81 | HR 77 | Temp 97.3°F | Resp 16 | Wt 153.9 lb

## 2019-11-25 DIAGNOSIS — Z79811 Long term (current) use of aromatase inhibitors: Secondary | ICD-10-CM

## 2019-11-25 DIAGNOSIS — M8580 Other specified disorders of bone density and structure, unspecified site: Secondary | ICD-10-CM | POA: Diagnosis not present

## 2019-11-25 DIAGNOSIS — Z9221 Personal history of antineoplastic chemotherapy: Secondary | ICD-10-CM | POA: Insufficient documentation

## 2019-11-25 DIAGNOSIS — C50411 Malignant neoplasm of upper-outer quadrant of right female breast: Secondary | ICD-10-CM | POA: Diagnosis present

## 2019-11-25 DIAGNOSIS — E2839 Other primary ovarian failure: Secondary | ICD-10-CM | POA: Diagnosis not present

## 2019-11-25 DIAGNOSIS — Z170 Estrogen receptor positive status [ER+]: Secondary | ICD-10-CM | POA: Diagnosis not present

## 2019-11-25 DIAGNOSIS — Z923 Personal history of irradiation: Secondary | ICD-10-CM | POA: Diagnosis not present

## 2019-11-25 LAB — CBC WITH DIFFERENTIAL/PLATELET
Abs Immature Granulocytes: 0.01 10*3/uL (ref 0.00–0.07)
Basophils Absolute: 0 10*3/uL (ref 0.0–0.1)
Basophils Relative: 1 %
Eosinophils Absolute: 0.1 10*3/uL (ref 0.0–0.5)
Eosinophils Relative: 2 %
HCT: 41.1 % (ref 36.0–46.0)
Hemoglobin: 13.7 g/dL (ref 12.0–15.0)
Immature Granulocytes: 0 %
Lymphocytes Relative: 33 %
Lymphs Abs: 1.8 10*3/uL (ref 0.7–4.0)
MCH: 30 pg (ref 26.0–34.0)
MCHC: 33.3 g/dL (ref 30.0–36.0)
MCV: 89.9 fL (ref 80.0–100.0)
Monocytes Absolute: 0.4 10*3/uL (ref 0.1–1.0)
Monocytes Relative: 7 %
Neutro Abs: 3 10*3/uL (ref 1.7–7.7)
Neutrophils Relative %: 57 %
Platelets: 259 10*3/uL (ref 150–400)
RBC: 4.57 MIL/uL (ref 3.87–5.11)
RDW: 11 % — ABNORMAL LOW (ref 11.5–15.5)
WBC: 5.3 10*3/uL (ref 4.0–10.5)
nRBC: 0 % (ref 0.0–0.2)

## 2019-11-25 LAB — COMPREHENSIVE METABOLIC PANEL
ALT: 14 U/L (ref 0–44)
AST: 18 U/L (ref 15–41)
Albumin: 4.4 g/dL (ref 3.5–5.0)
Alkaline Phosphatase: 50 U/L (ref 38–126)
Anion gap: 8 (ref 5–15)
BUN: 18 mg/dL (ref 6–20)
CO2: 29 mmol/L (ref 22–32)
Calcium: 9.5 mg/dL (ref 8.9–10.3)
Chloride: 101 mmol/L (ref 98–111)
Creatinine, Ser: 0.59 mg/dL (ref 0.44–1.00)
GFR calc Af Amer: >60 mL/min (ref >60)
GFR calc non Af Amer: >60 mL/min (ref >60)
Glucose, Bld: 98 mg/dL (ref 70–99)
Potassium: 4.5 mmol/L (ref 3.5–5.1)
Sodium: 138 mmol/L (ref 135–145)
Total Bilirubin: 0.7 mg/dL (ref 0.3–1.2)
Total Protein: 7.6 g/dL (ref 6.5–8.1)

## 2019-11-25 MED ORDER — GOSERELIN ACETATE 3.6 MG ~~LOC~~ IMPL
3.6000 mg | DRUG_IMPLANT | Freq: Once | SUBCUTANEOUS | Status: AC
  Administered 2019-11-25: 3.6 mg via SUBCUTANEOUS
  Filled 2019-11-25: qty 3.6

## 2019-11-25 MED ORDER — ZOLEDRONIC ACID 4 MG/100ML IV SOLN
4.0000 mg | Freq: Once | INTRAVENOUS | Status: AC
  Administered 2019-11-25: 4 mg via INTRAVENOUS
  Filled 2019-11-25: qty 100

## 2019-11-25 MED ORDER — SODIUM CHLORIDE 0.9 % IV SOLN
Freq: Once | INTRAVENOUS | Status: AC
  Filled 2019-11-25: qty 250

## 2019-11-25 NOTE — Progress Notes (Signed)
Patient denies problems/concerns today.  She had the port removed.

## 2019-11-25 NOTE — Progress Notes (Signed)
Hematology/Oncology Follow up note [facility] [phone] [phone]   Patient Care Team: [name], NP as PCP - General (Nurse Practitioner) Theodore Demark, RN as Oncology Nurse Navigator [name], [name], MD as Referring Physician (Surgery) [name], MD as Consulting Physician (Oncology) [name], MD as Consulting Physician (General Surgery) [name], MD as Referring Physician (Radiation Oncology)  REASON FOR VISIT Follow up for management of breast cancer   HISTORY OF PRESENTING ILLNESS:  Anne Wu is a  39 y.o.  female with diagnosis of cT2N0M0, grade 2 invasive mammary carcinoma.  She has significant family history on both maternal side as well as paternal side. She felt a right breast mass 2 months ago and diagnostic mammogram showed right upper quandrant 2.2 cm mass, which was confirmed on [place] and biopsied. Right axillary no clinically suspicious lymph node. Pathology showed invasive mammary carcinoma, ER/PR positive,, HER2 IHC  equivocal, FISH negative.   # Patient was evaluated by Dr.Smith. From surgical standpoint, Dr.Smith recommend neoadjuvant chemotherapy to facilitate surgery to obtain negative margin and better surgical outcome. Also given patient's young age, neoadjuvant chemotherapy is reasonable.    #Patient stated that she does not desire future fertility.  # 2D Echo showed normal systolic function, mild mitral regurgitation.  # CT was done  to rule out metastatic disease given micrometastasis. Images were independently reviewed by me and discussed with patient.  Negative for metastatic disease. # Testing did not reveal any pathogenic mutation in any of these genes. A copy of the genetic test report will be scanned into Epic under the Media tab.The genes analyzed were the 23 genes on [facility]'s Breast/GYN panel (ATM, BARD1, BRCA1, BRCA2, BRIP1, CDH1, CHEK2, DICER1, EPCAM, MLH1,  MSH2, MSH6,  NBN, NF1, PALB2, PMS2, PTEN, RAD50, RAD51C, RAD51D,SMARCA4, STK11, and TP53). 09/08/2017 Interval mammogram after 4 cycle of AC, showed good treatment response. The biopsy-proven malignancy in the upper outer quadrant of the right breast at posterior depth, associated with scattered microcalcifications and architectural distortion, has significantly decreased in size since the mammogram 06/20/2017. On today's mammogram, the mass measures approximately 1.7 x 2.2 x 1.2 cm (previously 2.4 x 1.6 x 2.7 cm).   Treatment Summary :  Jan 2019- May 2019 Neoadjuvant AC-->T  12/29/2017 Right lumpectomy and sentinel LN biopsy Pathology showed ypT2 ypN0 (i+) (sn) case was discussed on tumor board.ER/PR positive, HER 2 negative.  Aug 2019-September 2019 Adjuvant RT  Oct 2019 to start Zoladex and Aromasin. Mediport was removed.  INTERVAL HISTORY Anne Wu is a 39 y.o. female who has above history reviewed by me presents for follow up for breast cancer management  Patient takes Aromasin daily.  She tolerates well with manageable aches and pains She is also on ovarian suppression with Zoladex monthly. She reports no new complaints. She has teeth shifting problems and was evaluated by orthodontic who recommends short-term bracing.  She has not decided whether he should proceed with bracing or not. Denies any concerns of her breast.    Review of Systems  Constitutional: Negative for appetite change, chills, fatigue and fever.  HENT:   Negative for hearing loss and voice change.   Eyes: Negative for eye problems.  Respiratory: Negative for chest tightness and cough.   Cardiovascular: Negative for chest pain.  Gastrointestinal: Negative for abdominal distention, abdominal pain and blood in stool.  Endocrine: Positive for hot flashes.  Genitourinary: Negative for difficulty urinating and frequency.   Musculoskeletal: Positive for arthralgias.  Skin: Negative for  itching and rash.  Neurological: Negative for  dizziness, extremity weakness and light-headedness.  Hematological: Negative for adenopathy.  Psychiatric/Behavioral: Negative for confusion. The patient is not nervous/anxious.     MEDICAL HISTORY:  Past Medical History:  Diagnosis Date  . Asthma    AS A CHILD-NO INHALERS  . Breast cancer ([place]) 2018   Right breast cancer-[facility]  . Elevated blood pressure, situational    PT STATES HER LAST COUPLE OF MD APPOINTMENTS SHE HAS HAD ELEVATED BP-NEVER HAD A PROBLEM WITH THIS PREVIOUSLY  . Genetic testing 09/05/2017   Breast/GYN panel (23 genes) @ [facility] - No pathogenic mutations detected  . GERD (gastroesophageal reflux disease)    OCC- NO MEDS  . Malignant neoplasm of upper-outer quadrant of right breast in female, estrogen receptor positive ([facility]) 2019   [facility] and [facility]  . Personal history of chemotherapy   . Personal history of radiation therapy     SURGICAL HISTORY: Past Surgical History:  Procedure Laterality Date  . BREAST BIOPSY Right 06/20/2017   Invasive Mammary Carcinoma  . BREAST EXCISIONAL BIOPSY Right 12/29/2017   RESIDUAL INVASIVE MAMMARY CARCINOMA and DCIS  . BREAST LUMPECTOMY Right 12/29/2017   needle localization and sentinel node injection  . NO PAST SURGERIES    . PARTIAL MASTECTOMY WITH NEEDLE LOCALIZATION Right 12/29/2017   Procedure: PARTIAL MASTECTOMY WITH NEEDLE LOCALIZATION;  Surgeon: [name], MD;  Location: [facility] ORS;  Service: General;  Laterality: Right;  . PORTACATH PLACEMENT N/A 07/11/2017   Procedure: INSERTION PORT-A-CATH;  Surgeon: Leonie Green, MD;  Location: [facility] ORS;  Service: General;  Laterality: N/A;  . SENTINEL NODE BIOPSY Right 12/29/2017   Procedure: SENTINEL NODE BIOPSY;  Surgeon: [name], MD;  Location: [facility] ORS;  Service: General;  Laterality: Right;    SOCIAL HISTORY: Social History   Socioeconomic History  . Marital status: Married    Spouse name: Not on file  . Number of children: Not on file  . Years  of education: Not on file  . Highest education level: Not on file  Occupational History  . Not on file  Tobacco Use  . Smoking status: Never Smoker  . Smokeless tobacco: Never Used  Substance and Sexual Activity  . Alcohol use: No    Alcohol/week: 0.0 standard drinks  . Drug use: No  . Sexual activity: Yes  Other Topics Concern  . Not on file  Social History Narrative  . Not on file   Social Determinants of Health   Financial Resource Strain:   . Difficulty of Paying Living Expenses:   Food Insecurity:   . Worried About Charity fundraiser in the Last Year:   . Arboriculturist in the Last Year:   Transportation Needs:   . Film/video editor (Medical):   Marland Kitchen Lack of Transportation (Non-Medical):   Physical Activity:   . Days of Exercise per Week:   . Minutes of Exercise per Session:   Stress:   . Feeling of Stress :   Social Connections:   . Frequency of Communication with Friends and Family:   . Frequency of Social Gatherings with Friends and Family:   . Attends Religious Services:   . Active Member of Clubs or Organizations:   . Attends Archivist Meetings:   Marland Kitchen Marital Status:   Intimate Partner Violence:   . Fear of Current or Ex-Partner:   . Emotionally Abused:   Marland Kitchen Physically Abused:   . Sexually Abused:  FAMILY HISTORY: Family History  Problem Relation Age of Onset  . Breast cancer Maternal Aunt 24       currently late 74s  . Breast cancer Maternal Grandmother 68       second breast vs. recurrence ca at 75; deceased at 79  . Colon cancer Maternal Uncle 48       deceased in 72s  . Breast cancer Paternal Aunt        age at dx unknown; currently in 42s  . Breast cancer Paternal Grandmother        age at dx unknown  . Melanoma Mother        on leg  . Testicular cancer Maternal Uncle 27       currently 59s    ALLERGIES:  is allergic to codeine.  MEDICATIONS:  Current Outpatient Medications  Medication Sig Dispense Refill  .  acetaminophen (TYLENOL) 325 MG tablet Take 650 mg by mouth every 6 (six) hours as needed for moderate pain or headache.     . Calcium Carbonate-Vit D-Min (CALCIUM 1200 PO) Take 1 tablet by mouth daily.    . cholecalciferol (VITAMIN D) 1000 units tablet Take 1,000 Units by mouth daily.    Marland Kitchen exemestane (AROMASIN) 25 MG tablet TAKE 1 TABLET (25 MG TOTAL) BY MOUTH DAILY AFTER BREAKFAST. 90 tablet 3  . loratadine (CLARITIN) 10 MG tablet Take 10 mg by mouth daily as needed for allergies.     . Multiple Vitamin (MULTI-VITAMINS) TABS Take 1 tablet by mouth daily.     Marland Kitchen ELDERBERRY PO Take 1 tablet by mouth daily.    Marland Kitchen lidocaine-prilocaine (EMLA) cream APPLY TO AFFECTED AREA ONCE (Patient not taking: Reported on 11/25/2019) 30 g 3   No current facility-administered medications for this visit.   Facility-Administered Medications Ordered in Other Visits  Medication Dose Route Frequency Provider Last Rate Last Admin  . heparin lock flush 100 unit/mL  500 Units Intravenous Once [name], MD      . sodium chloride flush (NS) 0.9 % injection 10 mL  10 mL Intravenous PRN [name], MD   10 mL at 08/01/17 0829     PHYSICAL EXAMINATION: ECOG PERFORMANCE STATUS: 1 - Symptomatic but completely ambulatory Vitals:   11/25/19 1036  BP: 115/81  Pulse: 77  Resp: 16  Temp: (!) 97.3 F (36.3 C)   Filed Weights   11/25/19 1036  Weight: 153 lb 14.4 oz (69.8 kg)    Physical Exam  Constitutional: She is oriented to person, place, and time. No distress.  HENT:  Head: Normocephalic and atraumatic.  Nose: Nose normal.  Mouth/Throat: Oropharynx is clear and moist. No oropharyngeal exudate.  Eyes: Pupils are equal, round, and reactive to light. EOM are normal. No scleral icterus.  Neck: No JVD present.  Cardiovascular: Normal rate, regular rhythm, normal heart sounds and intact distal pulses. Exam reveals no friction rub.  No murmur heard. Pulmonary/Chest: Effort normal and breath sounds normal. No respiratory  distress. She has no rales. She exhibits no tenderness.  Abdominal: Soft. Bowel sounds are normal. She exhibits no distension. There is no abdominal tenderness.  Musculoskeletal:        General: No tenderness, deformity or edema. Normal range of motion.     Cervical back: Normal range of motion and neck supple.  Lymphadenopathy:    She has no cervical adenopathy.  Neurological: She is alert and oriented to person, place, and time. No cranial nerve deficit. She exhibits normal muscle tone. Coordination  normal.  Skin: Skin is warm and dry. She is not diaphoretic. No erythema.  Psychiatric: Affect normal.   Breast exam was performed in seated and lying down position. Patient is status post right breast lumpectomy with a well-healed surgical scar. No evidence of any right palpable masses. No palpable right axillary adenopathy. No palpable masses or lumps in the left breast. No evidence of left axillary adenopathy  Lab Results  Component Value Date   WBC 5.3 11/25/2019   HGB 13.7 11/25/2019   HCT 41.1 11/25/2019   MCV 89.9 11/25/2019   PLT 259 11/25/2019   Recent Labs    04/27/19 1255 04/27/19 1255 05/28/19 1332 07/28/19 1310 11/25/19 1020  NA 139   < > 137 135 138  K 3.9   < > 3.8 3.9 4.5  CL 103   < > 102 100 101  CO2 28   < > '29 27 29  ' GLUCOSE 96   < > 104* 126* 98  BUN 16   < > 22* 21* 18  CREATININE 0.60   < > 0.52 0.53 0.59  CALCIUM 9.2   < > 9.1 9.2 9.5  GFRNONAA >60   < > >60 >60 >60  GFRAA >60   < > >60 >60 >60  PROT 7.3  --   --  7.2 7.6  ALBUMIN 4.3  --   --  4.4 4.4  AST 21  --   --  19 18  ALT 15  --   --  12 14  ALKPHOS 68  --   --  54 50  BILITOT 0.5  --   --  0.3 0.7   < > = values in this interval not displayed.   RADIOGRAPHIC STUDIES: I have personally reviewed the radiological images as listed and agreed with the findings in the report. No results found.  ASSESSMENT & PLAN:  1. Malignant neoplasm of upper-outer quadrant of right breast in female,  estrogen receptor positive ([facility])   2. Aromatase inhibitor use   3. Suppression of ovarian secretion   4. Osteopenia, unspecified location   Cancer Staging Malignant neoplasm of upper-outer quadrant of right breast in female, estrogen receptor positive ([facility]) Staging form: Breast, AJCC 8th Edition - Clinical stage from 07/15/2017: Stage IB (cT2, cN0, cM0, G2, ER: Positive, PR: Positive, HER2: Negative) - Signed by [name], MD on 07/15/2017 #ypT2 ypN0 (i+) (sn), grade 2 breast cancer, ER/PR positive, HER 2 negative. Labs are reviewed and discussed with patient. Clinically she is doing well.   She tolerates Arimidex.   Continue monthly Zoladex treatments.   #Port-A-Cath has been discontinued. #Chronic aromatase inhibitor use, July 2020 DEXA showed osteopenia.  Continue calcium and vitamin D supplementation. Recommend Zometa every 6 months.  Patient is due for Zometa today we will proceed. She is also on Zometa treatment adjuvantly given her high risk breast cancer which  warranted chemotherapy.  She understands that it is not recommended to proceed with any dental procedure with the 6 to 8 weeks of Zometa treatments.  We spent sufficient time to discuss many aspect of care, questions were answered to patient's satisfaction. The patient knows to call the clinic with any problems questions or concerns.  Return visit: She will follow-up monthly for Zoladex injections, obtain CBC, CMP and follow-up in the clinic in 3 months   [name], MD, PhD Hematology [place] [facility] Pager- [phone] 11/25/2019

## 2019-11-26 ENCOUNTER — Ambulatory Visit: Payer: 59

## 2019-11-26 ENCOUNTER — Ambulatory Visit: Payer: 59 | Attending: Internal Medicine

## 2019-11-26 DIAGNOSIS — Z23 Encounter for immunization: Secondary | ICD-10-CM

## 2019-11-26 NOTE — Progress Notes (Signed)
   Covid-19 Vaccination Clinic  Name:  Anne Wu    MRN: [id number] DOB: 11/14/1980  11/26/2019  [name] was observed post Covid-19 immunization for 15 minutes without incident. She was provided with Vaccine Information Sheet and instruction to access the V-Safe system.   [name] was instructed to call 911 with any severe reactions post vaccine: Marland Kitchen Difficulty breathing  . Swelling of face and throat  . A fast heartbeat  . A bad rash all over body  . Dizziness and weakness   Immunizations Administered    Name Date Dose VIS Date Route   Pfizer COVID-19 Vaccine 11/26/2019 10:07 AM 0.3 mL 09/08/2018 Intramuscular   Manufacturer: [place]   Lot: [id number]   [place]: [id number]

## 2019-12-24 ENCOUNTER — Ambulatory Visit: Payer: 59

## 2019-12-25 ENCOUNTER — Other Ambulatory Visit: Payer: Self-pay

## 2019-12-25 ENCOUNTER — Ambulatory Visit: Payer: 59

## 2019-12-25 ENCOUNTER — Ambulatory Visit: Payer: 59 | Attending: Oncology

## 2019-12-25 DIAGNOSIS — Z23 Encounter for immunization: Secondary | ICD-10-CM

## 2019-12-25 NOTE — Progress Notes (Signed)
   Covid-19 Vaccination Clinic  Name:  Anne Wu    MRN: [id number] DOB: 1980-12-09  12/25/2019  [name] was observed post Covid-19 immunization for 15 minutes without incident. She was provided with Vaccine Information Sheet and instruction to access the V-Safe system.   [name] was instructed to call 911 with any severe reactions post vaccine: Marland Kitchen Difficulty breathing  . Swelling of face and throat  . A fast heartbeat  . A bad rash all over body  . Dizziness and weakness   Immunizations Administered    Name Date Dose VIS Date Route   Pfizer COVID-19 Vaccine 12/25/2019 11:22 AM 0.3 mL 09/08/2018 Intramuscular   Manufacturer: [place]   Lot: [id number]   [place]: [phone]

## 2019-12-27 ENCOUNTER — Other Ambulatory Visit: Payer: Self-pay

## 2019-12-27 ENCOUNTER — Inpatient Hospital Stay: Payer: 59 | Attending: Oncology

## 2019-12-27 DIAGNOSIS — Z9221 Personal history of antineoplastic chemotherapy: Secondary | ICD-10-CM | POA: Diagnosis not present

## 2019-12-27 DIAGNOSIS — Z923 Personal history of irradiation: Secondary | ICD-10-CM | POA: Diagnosis not present

## 2019-12-27 DIAGNOSIS — Z79811 Long term (current) use of aromatase inhibitors: Secondary | ICD-10-CM | POA: Insufficient documentation

## 2019-12-27 DIAGNOSIS — Z170 Estrogen receptor positive status [ER+]: Secondary | ICD-10-CM | POA: Insufficient documentation

## 2019-12-27 DIAGNOSIS — M8580 Other specified disorders of bone density and structure, unspecified site: Secondary | ICD-10-CM | POA: Insufficient documentation

## 2019-12-27 DIAGNOSIS — C50411 Malignant neoplasm of upper-outer quadrant of right female breast: Secondary | ICD-10-CM | POA: Insufficient documentation

## 2019-12-27 MED ORDER — GOSERELIN ACETATE 3.6 MG ~~LOC~~ IMPL
3.6000 mg | DRUG_IMPLANT | Freq: Once | SUBCUTANEOUS | Status: AC
  Administered 2019-12-27: 3.6 mg via SUBCUTANEOUS
  Filled 2019-12-27: qty 3.6

## 2020-01-18 ENCOUNTER — Inpatient Hospital Stay: Payer: 59 | Attending: Oncology | Admitting: Oncology

## 2020-01-18 ENCOUNTER — Other Ambulatory Visit: Payer: Self-pay

## 2020-01-18 ENCOUNTER — Encounter: Payer: Self-pay | Admitting: Oncology

## 2020-01-18 ENCOUNTER — Telehealth: Payer: Self-pay | Admitting: *Deleted

## 2020-01-18 VITALS — BP 117/80 | HR 92 | Temp 98.1°F | Resp 18 | Wt 157.9 lb

## 2020-01-18 DIAGNOSIS — Z170 Estrogen receptor positive status [ER+]: Secondary | ICD-10-CM | POA: Insufficient documentation

## 2020-01-18 DIAGNOSIS — N6489 Other specified disorders of breast: Secondary | ICD-10-CM | POA: Diagnosis not present

## 2020-01-18 DIAGNOSIS — C50411 Malignant neoplasm of upper-outer quadrant of right female breast: Secondary | ICD-10-CM | POA: Diagnosis present

## 2020-01-18 DIAGNOSIS — S2000XA Contusion of breast, unspecified breast, initial encounter: Secondary | ICD-10-CM

## 2020-01-18 DIAGNOSIS — Z79811 Long term (current) use of aromatase inhibitors: Secondary | ICD-10-CM | POA: Insufficient documentation

## 2020-01-18 NOTE — Telephone Encounter (Addendum)
Dr. Tasia Catchings would like to see pt. Ok to see today if pt prefers.

## 2020-01-18 NOTE — Progress Notes (Signed)
Hematology/Oncology Follow up note [facility] Telephone:([phone]) [phone] Fax:([phone]   Patient Care Team: [name], NP as PCP - General (Nurse Practitioner) Theodore Demark, RN as Oncology Nurse Navigator [name], [name], MD as Referring Physician (Surgery) [name], MD as Consulting Physician (Oncology) [name], MD as Consulting Physician (General Surgery) [name], MD as Referring Physician (Radiation Oncology)  REASON FOR VISIT Follow up for management of breast cancer   HISTORY OF PRESENTING ILLNESS:  Anne Wu is a  39 y.o.  female with diagnosis of cT2N0M0, grade 2 invasive mammary carcinoma.  She has significant family history on both maternal side as well as paternal side. She felt a right breast mass 2 months ago and diagnostic mammogram showed right upper quandrant 2.2 cm mass, which was confirmed on [place] and biopsied. Right axillary no clinically suspicious lymph node. Pathology showed invasive mammary carcinoma, ER/PR positive,, HER2 IHC  equivocal, FISH negative.   # Patient was evaluated by Dr.Smith. From surgical standpoint, Dr.Smith recommend neoadjuvant chemotherapy to facilitate surgery to obtain negative margin and better surgical outcome. Also given patient's young age, neoadjuvant chemotherapy is reasonable.    #Patient stated that she does not desire future fertility.  # 2D Echo showed normal systolic function, mild mitral regurgitation.  # CT was done  to rule out metastatic disease given micrometastasis. Images were independently reviewed by me and discussed with patient.  Negative for metastatic disease. # Testing did not reveal any pathogenic mutation in any of these genes. A copy of the genetic test report will be scanned into Epic under the Media tab.The genes analyzed were the 23 genes on [facility]'s Breast/GYN panel (ATM, BARD1, BRCA1, BRCA2, BRIP1, CDH1, CHEK2, DICER1, EPCAM, MLH1,  MSH2, MSH6,  NBN, NF1, PALB2, PMS2, PTEN, RAD50, RAD51C, RAD51D,SMARCA4, STK11, and TP53). 09/08/2017 Interval mammogram after 4 cycle of AC, showed good treatment response. The biopsy-proven malignancy in the upper outer quadrant of the right breast at posterior depth, associated with scattered microcalcifications and architectural distortion, has significantly decreased in size since the mammogram 06/20/2017. On today's mammogram, the mass measures approximately 1.7 x 2.2 x 1.2 cm (previously 2.4 x 1.6 x 2.7 cm).   Treatment Summary :  Jan 2019- May 2019 Neoadjuvant AC-->T  12/29/2017 Right lumpectomy and sentinel LN biopsy Pathology showed ypT2 ypN0 (i+) (sn) case was discussed on tumor board.ER/PR positive, HER 2 negative.  Aug 2019-September 2019 Adjuvant RT  Oct 2019 to start ovarian suppression Zoladex and Aromasin. Mediport has been removed.  INTERVAL HISTORY Anne Wu is a 39 y.o. female who has above history reviewed by me presents for follow up for breast cancer management  Patient takes Aromasin daily.   Today she presents for an acute visit for evaluation of right breast concern. She noticed right outer lower quadrant bruising and tenderness a few days ago.  Denies any recent trauma. No nipple discharge.     Review of Systems  Constitutional: Negative for appetite change, chills, fatigue and fever.  HENT:   Negative for hearing loss and voice change.   Eyes: Negative for eye problems.  Respiratory: Negative for chest tightness and cough.   Cardiovascular: Negative for chest pain.  Gastrointestinal: Negative for abdominal distention, abdominal pain and blood in stool.  Endocrine: Positive for hot flashes.  Genitourinary: Negative for difficulty urinating and frequency.   Musculoskeletal: Positive for arthralgias.  Skin: Negative for itching and rash.  Neurological: Negative for dizziness, extremity weakness and light-headedness.  Hematological: Negative for  adenopathy.    Psychiatric/Behavioral: Negative for confusion. The patient is not nervous/anxious.     MEDICAL HISTORY:  Past Medical History:  Diagnosis Date  . Asthma    AS A CHILD-NO INHALERS  . Breast cancer ([place]) 2018   Right breast cancer-[facility]  . Elevated blood pressure, situational    PT STATES HER LAST COUPLE OF MD APPOINTMENTS SHE HAS HAD ELEVATED BP-NEVER HAD A PROBLEM WITH THIS PREVIOUSLY  . Genetic testing 09/05/2017   Breast/GYN panel (23 genes) @ [facility] - No pathogenic mutations detected  . GERD (gastroesophageal reflux disease)    OCC- NO MEDS  . Malignant neoplasm of upper-outer quadrant of right breast in female, estrogen receptor positive ([place]) 2019   [facility] and DCIS  . Personal history of chemotherapy   . Personal history of radiation therapy     SURGICAL HISTORY: Past Surgical History:  Procedure Laterality Date  . BREAST BIOPSY Right 06/20/2017   Invasive Mammary Carcinoma  . BREAST EXCISIONAL BIOPSY Right 12/29/2017   RESIDUAL INVASIVE MAMMARY CARCINOMA and DCIS  . BREAST LUMPECTOMY Right 12/29/2017   needle localization and sentinel node injection  . NO PAST SURGERIES    . PARTIAL MASTECTOMY WITH NEEDLE LOCALIZATION Right 12/29/2017   Procedure: PARTIAL MASTECTOMY WITH NEEDLE LOCALIZATION;  Surgeon: [name], MD;  Location: [facility] ORS;  Service: General;  Laterality: Right;  . PORTACATH PLACEMENT N/A 07/11/2017   Procedure: INSERTION PORT-A-CATH;  Surgeon: Leonie Green, MD;  Location: [facility] ORS;  Service: General;  Laterality: N/A;  . SENTINEL NODE BIOPSY Right 12/29/2017   Procedure: SENTINEL NODE BIOPSY;  Surgeon: [name], MD;  Location: [facility] ORS;  Service: General;  Laterality: Right;    SOCIAL HISTORY: Social History   Socioeconomic History  . Marital status: Married    Spouse name: Not on file  . Number of children: Not on file  . Years of education: Not on file  . Highest education level: Not on file  Occupational History  .  Not on file  Tobacco Use  . Smoking status: Never Smoker  . Smokeless tobacco: Never Used  Vaping Use  . Vaping Use: Never used  Substance and Sexual Activity  . Alcohol use: No    Alcohol/week: 0.0 standard drinks  . Drug use: No  . Sexual activity: Yes  Other Topics Concern  . Not on file  Social History Narrative  . Not on file   Social Determinants of Health   Financial Resource Strain:   . Difficulty of Paying Living Expenses:   Food Insecurity:   . Worried About Charity fundraiser in the Last Year:   . Arboriculturist in the Last Year:   Transportation Needs:   . Film/video editor (Medical):   Marland Kitchen Lack of Transportation (Non-Medical):   Physical Activity:   . Days of Exercise per Week:   . Minutes of Exercise per Session:   Stress:   . Feeling of Stress :   Social Connections:   . Frequency of Communication with Friends and Family:   . Frequency of Social Gatherings with Friends and Family:   . Attends Religious Services:   . Active Member of Clubs or Organizations:   . Attends Archivist Meetings:   Marland Kitchen Marital Status:   Intimate Partner Violence:   . Fear of Current or Ex-Partner:   . Emotionally Abused:   Marland Kitchen Physically Abused:   . Sexually Abused:     FAMILY HISTORY: Family History  Problem  Relation Age of Onset  . Breast cancer Maternal Aunt 42       currently late 110s  . Breast cancer Maternal Grandmother 68       second breast vs. recurrence ca at 63; deceased at 78  . Colon cancer Maternal Uncle 75       deceased in 60s  . Breast cancer Paternal Aunt        age at dx unknown; currently in 48s  . Breast cancer Paternal Grandmother        age at dx unknown  . Melanoma Mother        on leg  . Testicular cancer Maternal Uncle 25       currently 62s    ALLERGIES:  is allergic to codeine.  MEDICATIONS:  Current Outpatient Medications  Medication Sig Dispense Refill  . acetaminophen (TYLENOL) 325 MG tablet Take 650 mg by mouth  every 6 (six) hours as needed for moderate pain or headache.     . Calcium Carbonate-Vit D-Min (CALCIUM 1200 PO) Take 1 tablet by mouth daily.    . cholecalciferol (VITAMIN D) 1000 units tablet Take 1,000 Units by mouth daily.    Marland Kitchen ELDERBERRY PO Take 1 tablet by mouth daily.    Marland Kitchen exemestane (AROMASIN) 25 MG tablet TAKE 1 TABLET (25 MG TOTAL) BY MOUTH DAILY AFTER BREAKFAST. 90 tablet 3  . lidocaine-prilocaine (EMLA) cream APPLY TO AFFECTED AREA ONCE (Patient not taking: Reported on 11/25/2019) 30 g 3  . loratadine (CLARITIN) 10 MG tablet Take 10 mg by mouth daily as needed for allergies.     . Multiple Vitamin (MULTI-VITAMINS) TABS Take 1 tablet by mouth daily.      No current facility-administered medications for this visit.   Facility-Administered Medications Ordered in Other Visits  Medication Dose Route Frequency Provider Last Rate Last Admin  . heparin lock flush 100 unit/mL  500 Units Intravenous Once [name], MD      . sodium chloride flush (NS) 0.9 % injection 10 mL  10 mL Intravenous PRN [name], MD   10 mL at 08/01/17 0829     PHYSICAL EXAMINATION: ECOG PERFORMANCE STATUS: 1 - Symptomatic but completely ambulatory Vitals:   01/18/20 1540  BP: 117/80  Pulse: 92  Resp: 18  Temp: 98.1 F (36.7 C)   Filed Weights   01/18/20 1540  Weight: 157 lb 14.4 oz (71.6 kg)    Physical Exam Constitutional:      General: She is not in acute distress.    Appearance: She is not diaphoretic.  HENT:     Head: Normocephalic and atraumatic.     Nose: Nose normal.     Mouth/Throat:     Pharynx: No oropharyngeal exudate.  Eyes:     General: No scleral icterus.    Pupils: Pupils are equal, round, and reactive to light.  Neck:     Vascular: No JVD.  Cardiovascular:     Rate and Rhythm: Normal rate and regular rhythm.     Heart sounds: Normal heart sounds. No murmur heard.  No friction rub.  Pulmonary:     Effort: Pulmonary effort is normal. No respiratory distress.     Breath  sounds: Normal breath sounds. No rales.  Chest:     Chest wall: No tenderness.  Abdominal:     General: Bowel sounds are normal. There is no distension.     Palpations: Abdomen is soft.     Tenderness: There is no abdominal tenderness.  Musculoskeletal:        General: No tenderness or deformity. Normal range of motion.     Cervical back: Normal range of motion and neck supple.  Lymphadenopathy:     Cervical: No cervical adenopathy.  Skin:    General: Skin is warm and dry.     Findings: No erythema.  Neurological:     Mental Status: She is alert and oriented to person, place, and time.     Cranial Nerves: No cranial nerve deficit.     Motor: No abnormal muscle tone.     Coordination: Coordination normal.  Psychiatric:        Mood and Affect: Affect normal.    Breast exam was performed in seated and lying down position. Patient is status post right breast lumpectomy with a well-healed surgical scar.  Right outer lower quadrant skin discoloration-bruising, 1cm Right breast skin Telangiectasia No palpable right axillary adenopathy. No palpable masses or lumps in the left breast. No evidence of left axillary adenopathy     Lab Results  Component Value Date   WBC 5.3 11/25/2019   HGB 13.7 11/25/2019   HCT 41.1 11/25/2019   MCV 89.9 11/25/2019   PLT 259 11/25/2019   Recent Labs    04/27/19 1255 04/27/19 1255 05/28/19 1332 07/28/19 1310 11/25/19 1020  NA 139   < > 137 135 138  K 3.9   < > 3.8 3.9 4.5  CL 103   < > 102 100 101  CO2 28   < > '29 27 29  ' GLUCOSE 96   < > 104* 126* 98  BUN 16   < > 22* 21* 18  CREATININE 0.60   < > 0.52 0.53 0.59  CALCIUM 9.2   < > 9.1 9.2 9.5  GFRNONAA >60   < > >60 >60 >60  GFRAA >60   < > >60 >60 >60  PROT 7.3  --   --  7.2 7.6  ALBUMIN 4.3  --   --  4.4 4.4  AST 21  --   --  19 18  ALT 15  --   --  12 14  ALKPHOS 68  --   --  54 50  BILITOT 0.5  --   --  0.3 0.7   < > = values in this interval not displayed.   RADIOGRAPHIC  STUDIES: I have personally reviewed the radiological images as listed and agreed with the findings in the report. No results found.  ASSESSMENT & PLAN:  1. Malignant neoplasm of upper-outer quadrant of right breast in female, estrogen receptor positive ([place])   2. Bruise of breast   Cancer Staging Malignant neoplasm of upper-outer quadrant of right breast in female, estrogen receptor positive ([place]) Staging form: Breast, AJCC 8th Edition - Clinical stage from 07/15/2017: Stage IB (cT2, cN0, cM0, G2, ER: Positive, PR: Positive, HER2: Negative) - Signed by [name], MD on 07/15/2017 #ypT2 ypN0 (i+) (sn), grade 2 breast cancer, ER/PR positive, HER 2 negative. Labs are reviewed and discussed with patient. Clinically she is doing well.   Continue Arimidex and monthly Zoladex.  #Right breast bruise with focal skin changes and bruise.  Bruise maybe trauma induced. Observation.  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  I ask patient to update me if the bruising tender area does not resolve in 2 weeks.   We spent sufficient time to discuss many aspect of care, questions were answered to patient's satisfaction. The patient knows to call the  clinic with any problems questions or concerns.  Return visit: keep previously scheduled  appointments.   [name], MD, PhD Hematology Oncology [facility] at [facility] Pager- [phone] 01/18/2020

## 2020-01-18 NOTE — Telephone Encounter (Signed)
Can she come in this afternoon at like 1 or 1:30?  Faythe Casa, NP 01/18/2020 9:03 AM

## 2020-01-18 NOTE — Telephone Encounter (Signed)
Perfect- I didn't even look to see who the MD was. Sorry about that.   Faythe Casa, NP 01/18/2020 9:44 AM

## 2020-01-18 NOTE — Telephone Encounter (Signed)
Please schedule patient to see MD only on Thurs or Friday.

## 2020-01-18 NOTE — Telephone Encounter (Signed)
Patient called reporting that she has some skin changes to her treatment area (Right breast) and would like for someone to see it. Please advise

## 2020-01-18 NOTE — Telephone Encounter (Signed)
Who is calling patient to schedule this?

## 2020-01-18 NOTE — Telephone Encounter (Signed)
Patient called Anne Wu today as well and she scheduled her to see Dr. Tasia Catchings today.  She is already scheduled and patient is aware of appointment.

## 2020-01-18 NOTE — Progress Notes (Signed)
This weekend patient noticed right breast changes that looks like a bruised area with a small lump.

## 2020-01-25 ENCOUNTER — Other Ambulatory Visit: Payer: Self-pay

## 2020-01-25 ENCOUNTER — Inpatient Hospital Stay: Payer: 59

## 2020-01-25 DIAGNOSIS — C50411 Malignant neoplasm of upper-outer quadrant of right female breast: Secondary | ICD-10-CM | POA: Diagnosis not present

## 2020-01-25 MED ORDER — GOSERELIN ACETATE 3.6 MG ~~LOC~~ IMPL
3.6000 mg | DRUG_IMPLANT | Freq: Once | SUBCUTANEOUS | Status: AC
  Administered 2020-01-25: 3.6 mg via SUBCUTANEOUS
  Filled 2020-01-25: qty 3.6

## 2020-02-24 ENCOUNTER — Other Ambulatory Visit: Payer: Self-pay

## 2020-02-24 DIAGNOSIS — Z170 Estrogen receptor positive status [ER+]: Secondary | ICD-10-CM

## 2020-02-24 DIAGNOSIS — C50411 Malignant neoplasm of upper-outer quadrant of right female breast: Secondary | ICD-10-CM

## 2020-02-25 ENCOUNTER — Inpatient Hospital Stay (HOSPITAL_BASED_OUTPATIENT_CLINIC_OR_DEPARTMENT_OTHER): Payer: 59 | Admitting: Oncology

## 2020-02-25 ENCOUNTER — Inpatient Hospital Stay: Payer: 59

## 2020-02-25 ENCOUNTER — Encounter: Payer: Self-pay | Admitting: Oncology

## 2020-02-25 ENCOUNTER — Inpatient Hospital Stay: Payer: 59 | Attending: Oncology

## 2020-02-25 ENCOUNTER — Other Ambulatory Visit: Payer: Self-pay

## 2020-02-25 VITALS — BP 119/80 | HR 86 | Temp 98.4°F | Resp 16 | Wt 155.0 lb

## 2020-02-25 DIAGNOSIS — Z170 Estrogen receptor positive status [ER+]: Secondary | ICD-10-CM | POA: Insufficient documentation

## 2020-02-25 DIAGNOSIS — Z79811 Long term (current) use of aromatase inhibitors: Secondary | ICD-10-CM | POA: Insufficient documentation

## 2020-02-25 DIAGNOSIS — M8580 Other specified disorders of bone density and structure, unspecified site: Secondary | ICD-10-CM | POA: Insufficient documentation

## 2020-02-25 DIAGNOSIS — C50411 Malignant neoplasm of upper-outer quadrant of right female breast: Secondary | ICD-10-CM | POA: Insufficient documentation

## 2020-02-25 LAB — COMPREHENSIVE METABOLIC PANEL
ALT: 13 U/L (ref 0–44)
AST: 17 U/L (ref 15–41)
Albumin: 4.2 g/dL (ref 3.5–5.0)
Alkaline Phosphatase: 39 U/L (ref 38–126)
Anion gap: 10 (ref 5–15)
BUN: 17 mg/dL (ref 6–20)
CO2: 26 mmol/L (ref 22–32)
Calcium: 8.8 mg/dL — ABNORMAL LOW (ref 8.9–10.3)
Chloride: 102 mmol/L (ref 98–111)
Creatinine, Ser: 0.49 mg/dL (ref 0.44–1.00)
GFR calc Af Amer: >60 mL/min (ref >60)
GFR calc non Af Amer: >60 mL/min (ref >60)
Glucose, Bld: 108 mg/dL — ABNORMAL HIGH (ref 70–99)
Potassium: 4.2 mmol/L (ref 3.5–5.1)
Sodium: 138 mmol/L (ref 135–145)
Total Bilirubin: 0.4 mg/dL (ref 0.3–1.2)
Total Protein: 7.1 g/dL (ref 6.5–8.1)

## 2020-02-25 LAB — CBC WITH DIFFERENTIAL/PLATELET
Abs Immature Granulocytes: 0.01 10*3/uL (ref 0.00–0.07)
Basophils Absolute: 0.1 10*3/uL (ref 0.0–0.1)
Basophils Relative: 1 %
Eosinophils Absolute: 0.1 10*3/uL (ref 0.0–0.5)
Eosinophils Relative: 2 %
HCT: 37.7 % (ref 36.0–46.0)
Hemoglobin: 13 g/dL (ref 12.0–15.0)
Immature Granulocytes: 0 %
Lymphocytes Relative: 32 %
Lymphs Abs: 2 10*3/uL (ref 0.7–4.0)
MCH: 30.1 pg (ref 26.0–34.0)
MCHC: 34.5 g/dL (ref 30.0–36.0)
MCV: 87.3 fL (ref 80.0–100.0)
Monocytes Absolute: 0.3 10*3/uL (ref 0.1–1.0)
Monocytes Relative: 5 %
Neutro Abs: 3.7 10*3/uL (ref 1.7–7.7)
Neutrophils Relative %: 60 %
Platelets: 260 10*3/uL (ref 150–400)
RBC: 4.32 MIL/uL (ref 3.87–5.11)
RDW: 11.3 % — ABNORMAL LOW (ref 11.5–15.5)
WBC: 6.3 10*3/uL (ref 4.0–10.5)
nRBC: 0 % (ref 0.0–0.2)

## 2020-02-25 MED ORDER — GOSERELIN ACETATE 3.6 MG ~~LOC~~ IMPL
3.6000 mg | DRUG_IMPLANT | Freq: Once | SUBCUTANEOUS | Status: AC
  Administered 2020-02-25: 3.6 mg via SUBCUTANEOUS
  Filled 2020-02-25: qty 3.6

## 2020-02-25 NOTE — Progress Notes (Signed)
Patient recently fractured left had.

## 2020-02-25 NOTE — Progress Notes (Signed)
Hematology/Oncology Follow up note [facility] [phone] [phone]   Patient Care Team: [name], NP as PCP - General (Nurse Practitioner) Theodore Demark, RN as Oncology Nurse Navigator [name], [name], MD as Referring Physician (Surgery) [name], MD as Consulting Physician (Oncology) [name], MD as Consulting Physician (General Surgery) [name], MD as Referring Physician (Radiation Oncology)  REASON FOR VISIT Follow up for management of breast cancer   HISTORY OF PRESENTING ILLNESS:  Anne Wu is a  39 y.o.  female with diagnosis of cT2N0M0, grade 2 invasive mammary carcinoma.  She has significant family history on both maternal side as well as paternal side. She felt a right breast mass 2 months ago and diagnostic mammogram showed right upper quandrant 2.2 cm mass, which was confirmed on [place] and biopsied. Right axillary no clinically suspicious lymph node. Pathology showed invasive mammary carcinoma, ER/PR positive,, HER2 IHC  equivocal, FISH negative.   # Patient was evaluated by Dr.Smith. From surgical standpoint, Dr.Smith recommend neoadjuvant chemotherapy to facilitate surgery to obtain negative margin and better surgical outcome. Also given patient's young age, neoadjuvant chemotherapy is reasonable.    #Patient stated that she does not desire future fertility.  # 2D Echo showed normal systolic function, mild mitral regurgitation.  # CT was done  to rule out metastatic disease given micrometastasis. Images were independently reviewed by me and discussed with patient.  Negative for metastatic disease. # Testing did not reveal any pathogenic mutation in any of these genes. A copy of the genetic test report will be scanned into Epic under the Media tab.The genes analyzed were the 23 genes on [facility]'s Breast/GYN panel (ATM, BARD1, BRCA1, BRCA2, BRIP1, CDH1, CHEK2, DICER1, EPCAM, MLH1,  MSH2, MSH6,  NBN, NF1, PALB2, PMS2, PTEN, RAD50, RAD51C, RAD51D,SMARCA4, STK11, and TP53). 09/08/2017 Interval mammogram after 4 cycle of AC, showed good treatment response. The biopsy-proven malignancy in the upper outer quadrant of the right breast at posterior depth, associated with scattered microcalcifications and architectural distortion, has significantly decreased in size since the mammogram 06/20/2017. On today's mammogram, the mass measures approximately 1.7 x 2.2 x 1.2 cm (previously 2.4 x 1.6 x 2.7 cm).   Treatment Summary :  Jan 2019- May 2019 Neoadjuvant AC-->T  12/29/2017 Right lumpectomy and sentinel LN biopsy Pathology showed ypT2 ypN0 (i+) (sn) case was discussed on tumor board.ER/PR positive, HER 2 negative.  Aug 2019-September 2019 Adjuvant RT  Oct 2019 to start Zoladex and Aromasin. Mediport was removed.  INTERVAL HISTORY Anne Wu is a 39 y.o. female who has above history reviewed by me presents for follow up for breast cancer management  Patient takes Aromasin daily.  As well as ovarian suppression with monthly Zoladex. Patient reports tolerating the treatment.  Manageable hot flash Patient was seen by me for acute visit due to right breast bruising.  Was thought to be trauma induced. During interval, patient has injured her left hand and has a fracture of the shaft of the metacarpal bone.  She wears splint.  She follows up with orthopedic surgeon.  Review of Systems  Constitutional: Negative for appetite change, chills, fatigue and fever.  HENT:   Negative for hearing loss and voice change.   Eyes: Negative for eye problems.  Respiratory: Negative for chest tightness and cough.   Cardiovascular: Negative for chest pain.  Gastrointestinal: Negative for abdominal distention, abdominal pain and blood in stool.  Endocrine: Positive for hot flashes.  Genitourinary: Negative for difficulty urinating and frequency.  Musculoskeletal: Positive for arthralgias.       Left hand fracture   Skin: Negative for itching and rash.  Neurological: Negative for dizziness, extremity weakness and light-headedness.  Hematological: Negative for adenopathy.  Psychiatric/Behavioral: Negative for confusion. The patient is not nervous/anxious.     MEDICAL HISTORY:  Past Medical History:  Diagnosis Date  . Asthma    AS A CHILD-NO INHALERS  . Breast cancer ([place]) 2018   Right breast cancer-[facility]  . Elevated blood pressure, situational    PT STATES HER LAST COUPLE OF MD APPOINTMENTS SHE HAS HAD ELEVATED BP-NEVER HAD A PROBLEM WITH THIS PREVIOUSLY  . Genetic testing 09/05/2017   Breast/GYN panel (23 genes) @ [facility] - No pathogenic mutations detected  . GERD (gastroesophageal reflux disease)    OCC- NO MEDS  . Malignant neoplasm of upper-outer quadrant of right breast in female, estrogen receptor positive ([place]) 2019   [facility] and DCIS  . Personal history of chemotherapy   . Personal history of radiation therapy     SURGICAL HISTORY: Past Surgical History:  Procedure Laterality Date  . BREAST BIOPSY Right 06/20/2017   Invasive Mammary Carcinoma  . BREAST EXCISIONAL BIOPSY Right 12/29/2017   RESIDUAL INVASIVE MAMMARY CARCINOMA and DCIS  . BREAST LUMPECTOMY Right 12/29/2017   needle localization and sentinel node injection  . NO PAST SURGERIES    . PARTIAL MASTECTOMY WITH NEEDLE LOCALIZATION Right 12/29/2017   Procedure: PARTIAL MASTECTOMY WITH NEEDLE LOCALIZATION;  Surgeon: [name], MD;  Location: [facility] ORS;  Service: General;  Laterality: Right;  . PORTACATH PLACEMENT N/A 07/11/2017   Procedure: INSERTION PORT-A-CATH;  Surgeon: Leonie Green, MD;  Location: [facility] ORS;  Service: General;  Laterality: N/A;  . SENTINEL NODE BIOPSY Right 12/29/2017   Procedure: SENTINEL NODE BIOPSY;  Surgeon: [name], MD;  Location: [facility] ORS;  Service: General;  Laterality: Right;    SOCIAL HISTORY: Social History   Socioeconomic History  . Marital status: Married     Spouse name: Not on file  . Number of children: Not on file  . Years of education: Not on file  . Highest education level: Not on file  Occupational History  . Not on file  Tobacco Use  . Smoking status: Never Smoker  . Smokeless tobacco: Never Used  Vaping Use  . Vaping Use: Never used  Substance and Sexual Activity  . Alcohol use: No    Alcohol/week: 0.0 standard drinks  . Drug use: No  . Sexual activity: Yes  Other Topics Concern  . Not on file  Social History Narrative  . Not on file   Social Determinants of Health   Financial Resource Strain:   . Difficulty of Paying Living Expenses:   Food Insecurity:   . Worried About Charity fundraiser in the Last Year:   . Arboriculturist in the Last Year:   Transportation Needs:   . Film/video editor (Medical):   Marland Kitchen Lack of Transportation (Non-Medical):   Physical Activity:   . Days of Exercise per Week:   . Minutes of Exercise per Session:   Stress:   . Feeling of Stress :   Social Connections:   . Frequency of Communication with Friends and Family:   . Frequency of Social Gatherings with Friends and Family:   . Attends Religious Services:   . Active Member of Clubs or Organizations:   . Attends Archivist Meetings:   Marland Kitchen Marital Status:   Intimate Partner Violence:   .  Fear of Current or Ex-Partner:   . Emotionally Abused:   Marland Kitchen Physically Abused:   . Sexually Abused:     FAMILY HISTORY: Family History  Problem Relation Age of Onset  . Breast cancer Maternal Aunt 8       currently late 11s  . Breast cancer Maternal Grandmother 68       second breast vs. recurrence ca at 70; deceased at 32  . Colon cancer Maternal Uncle 20       deceased in 14s  . Breast cancer Paternal Aunt        age at dx unknown; currently in 78s  . Breast cancer Paternal Grandmother        age at dx unknown  . Melanoma Mother        on leg  . Testicular cancer Maternal Uncle 19       currently 30s    ALLERGIES:  is  allergic to codeine.  MEDICATIONS:  Current Outpatient Medications  Medication Sig Dispense Refill  . acetaminophen (TYLENOL) 325 MG tablet Take 650 mg by mouth every 6 (six) hours as needed for moderate pain or headache.     . Calcium Carbonate-Vit D-Min (CALCIUM 1200 PO) Take 1 tablet by mouth daily.    . cholecalciferol (VITAMIN D) 1000 units tablet Take 1,000 Units by mouth daily.    Marland Kitchen ELDERBERRY PO Take 1 tablet by mouth daily.    Marland Kitchen exemestane (AROMASIN) 25 MG tablet TAKE 1 TABLET (25 MG TOTAL) BY MOUTH DAILY AFTER BREAKFAST. 90 tablet 3  . loratadine (CLARITIN) 10 MG tablet Take 10 mg by mouth daily as needed for allergies.     . Multiple Vitamin (MULTI-VITAMINS) TABS Take 1 tablet by mouth daily.     Marland Kitchen lidocaine-prilocaine (EMLA) cream APPLY TO AFFECTED AREA ONCE (Patient not taking: Reported on 11/25/2019) 30 g 3   No current facility-administered medications for this visit.   Facility-Administered Medications Ordered in Other Visits  Medication Dose Route Frequency Provider Last Rate Last Admin  . heparin lock flush 100 unit/mL  500 Units Intravenous Once [name], MD      . sodium chloride flush (NS) 0.9 % injection 10 mL  10 mL Intravenous PRN [name], MD   10 mL at 08/01/17 0829     PHYSICAL EXAMINATION: ECOG PERFORMANCE STATUS: 1 - Symptomatic but completely ambulatory Vitals:   02/25/20 1325  BP: 119/80  Pulse: 86  Resp: 16  Temp: 98.4 F (36.9 C)   Filed Weights   02/25/20 1325  Weight: 155 lb (70.3 kg)    Physical Exam Constitutional:      General: She is not in acute distress.    Appearance: She is not diaphoretic.  HENT:     Head: Normocephalic and atraumatic.     Nose: Nose normal.     Mouth/Throat:     Pharynx: No oropharyngeal exudate.  Eyes:     General: No scleral icterus.    Pupils: Pupils are equal, round, and reactive to light.  Neck:     Vascular: No JVD.  Cardiovascular:     Rate and Rhythm: Normal rate and regular rhythm.     Heart  sounds: Normal heart sounds. No murmur heard.  No friction rub.  Pulmonary:     Effort: Pulmonary effort is normal. No respiratory distress.     Breath sounds: Normal breath sounds. No rales.  Chest:     Chest wall: No tenderness.  Abdominal:  General: Bowel sounds are normal. There is no distension.     Palpations: Abdomen is soft.     Tenderness: There is no abdominal tenderness.  Musculoskeletal:        General: No tenderness or deformity. Normal range of motion.     Cervical back: Normal range of motion and neck supple.  Lymphadenopathy:     Cervical: No cervical adenopathy.  Skin:    General: Skin is warm and dry.     Findings: No erythema.  Neurological:     Mental Status: She is alert and oriented to person, place, and time.     Cranial Nerves: No cranial nerve deficit.     Motor: No abnormal muscle tone.     Coordination: Coordination normal.  Psychiatric:        Mood and Affect: Affect normal.    Breast exam was performed in seated position. Patient is status post right breast lumpectomy with a well-healed surgical scar. No evidence of any right palpable masses. No palpable right axillary adenopathy. No palpable masses or lumps in the left breast. No evidence of left axillary adenopathy Right outer lower quadrant skin discoloration-bruising has resolved.  Chronic right breast skin telangiectasia unchanged.  Lab Results  Component Value Date   WBC 6.3 02/25/2020   HGB 13.0 02/25/2020   HCT 37.7 02/25/2020   MCV 87.3 02/25/2020   PLT 260 02/25/2020   Recent Labs    07/28/19 1310 11/25/19 1020 02/25/20 1311  NA 135 138 138  K 3.9 4.5 4.2  CL 100 101 102  CO2 _0 GLUCOSE 126* 98 108*  BUN 21* 18 17  CREATININE 0.53 0.59 0.49  CALCIUM 9.2 9.5 8.8*  GFRNONAA >60 >60 >60  GFRAA >60 >60 >60  PROT 7.2 7.6 7.1  ALBUMIN 4.4 4.4 4.2  AST _1 ALT _2 ALKPHOS 54 50 39  BILITOT 0.3 0.7 0.4   RADIOGRAPHIC STUDIES: I have personally reviewed  the radiological images as listed and agreed with the findings in the report. No results found.  ASSESSMENT & PLAN:  1. Malignant neoplasm of upper-outer quadrant of right breast in female, estrogen receptor positive ([place])   Cancer Staging Malignant neoplasm of upper-outer quadrant of right breast in female, estrogen receptor positive ([place]) Staging form: Breast, AJCC 8th Edition - Clinical stage from 07/15/2017: Stage IB (cT2, cN0, cM0, G2, ER: Positive, PR: Positive, HER2: Negative) - Signed by [name], MD on 07/15/2017 #ypT2 ypN0 (i+) (sn), grade 2 breast cancer, ER/PR positive, HER 2 negative. Labs are reviewed and discussed with patient. Patient has been doing well clinically. Continue Aromasin and monthly Zoladex treatments. She is due for mammogram in December 2021  #Chronic aromatase inhibitor use, July 2020 DEXA showed osteopenia.  Continue calcium and vitamin D supplementation.  Continue Zometa every 6 months.  Next due in November 2021.  We spent sufficient time to discuss many aspect of care, questions were answered to patient's satisfaction. The patient knows to call the clinic with any problems questions or concerns.  Return visit: She will follow-up monthly for Zoladex injections, obtain CBC, CMP and follow-up in the clinic in 3 months   [name], MD, PhD Hematology [facility] Pager- [phone] 02/25/2020

## 2020-03-24 ENCOUNTER — Inpatient Hospital Stay: Payer: 59 | Attending: Oncology

## 2020-03-24 ENCOUNTER — Other Ambulatory Visit: Payer: Self-pay

## 2020-03-24 DIAGNOSIS — Z170 Estrogen receptor positive status [ER+]: Secondary | ICD-10-CM | POA: Insufficient documentation

## 2020-03-24 DIAGNOSIS — Z79899 Other long term (current) drug therapy: Secondary | ICD-10-CM | POA: Insufficient documentation

## 2020-03-24 DIAGNOSIS — C50411 Malignant neoplasm of upper-outer quadrant of right female breast: Secondary | ICD-10-CM | POA: Diagnosis not present

## 2020-03-24 DIAGNOSIS — Z79811 Long term (current) use of aromatase inhibitors: Secondary | ICD-10-CM | POA: Insufficient documentation

## 2020-03-24 MED ORDER — GOSERELIN ACETATE 3.6 MG ~~LOC~~ IMPL
3.6000 mg | DRUG_IMPLANT | Freq: Once | SUBCUTANEOUS | Status: AC
  Administered 2020-03-24: 3.6 mg via SUBCUTANEOUS
  Filled 2020-03-24: qty 3.6

## 2020-04-06 ENCOUNTER — Encounter: Payer: Self-pay | Admitting: Oncology

## 2020-04-21 ENCOUNTER — Other Ambulatory Visit: Payer: Self-pay

## 2020-04-21 ENCOUNTER — Inpatient Hospital Stay: Payer: 59 | Attending: Oncology

## 2020-04-21 DIAGNOSIS — Z170 Estrogen receptor positive status [ER+]: Secondary | ICD-10-CM | POA: Insufficient documentation

## 2020-04-21 DIAGNOSIS — Z79811 Long term (current) use of aromatase inhibitors: Secondary | ICD-10-CM | POA: Insufficient documentation

## 2020-04-21 DIAGNOSIS — C50411 Malignant neoplasm of upper-outer quadrant of right female breast: Secondary | ICD-10-CM | POA: Insufficient documentation

## 2020-04-21 DIAGNOSIS — Z79899 Other long term (current) drug therapy: Secondary | ICD-10-CM | POA: Insufficient documentation

## 2020-04-21 MED ORDER — GOSERELIN ACETATE 3.6 MG ~~LOC~~ IMPL
3.6000 mg | DRUG_IMPLANT | Freq: Once | SUBCUTANEOUS | Status: AC
  Administered 2020-04-21: 3.6 mg via SUBCUTANEOUS
  Filled 2020-04-21: qty 3.6

## 2020-04-24 ENCOUNTER — Other Ambulatory Visit: Payer: Self-pay | Admitting: General Surgery

## 2020-04-24 DIAGNOSIS — Z853 Personal history of malignant neoplasm of breast: Secondary | ICD-10-CM

## 2020-04-26 ENCOUNTER — Ambulatory Visit
Admission: RE | Admit: 2020-04-26 | Discharge: 2020-04-26 | Disposition: A | Discharge status: Home / Self Care | Payer: 59 | Source: Ambulatory Visit | Attending: Radiation Oncology | Admitting: Radiation Oncology

## 2020-04-26 ENCOUNTER — Encounter: Payer: Self-pay | Admitting: Radiation Oncology

## 2020-04-26 ENCOUNTER — Other Ambulatory Visit: Payer: Self-pay

## 2020-04-26 VITALS — BP 121/78 | HR 91 | Temp 97.5°F | Wt 159.6 lb

## 2020-04-26 DIAGNOSIS — Z79811 Long term (current) use of aromatase inhibitors: Secondary | ICD-10-CM | POA: Diagnosis not present

## 2020-04-26 DIAGNOSIS — Z923 Personal history of irradiation: Secondary | ICD-10-CM | POA: Insufficient documentation

## 2020-04-26 DIAGNOSIS — C50411 Malignant neoplasm of upper-outer quadrant of right female breast: Secondary | ICD-10-CM | POA: Insufficient documentation

## 2020-04-26 DIAGNOSIS — Z170 Estrogen receptor positive status [ER+]: Secondary | ICD-10-CM | POA: Diagnosis not present

## 2020-04-26 NOTE — Progress Notes (Signed)
Radiation Oncology Follow up Note  Name: Anne Wu   Date:   04/26/2020 MRN:  [id number] DOB: Jun 29, 1981    This 39 y.o. female presents to the clinic today for 2-year follow-up status post whole breast radiation to her right breast for a T2N0 ER/PR positive invasive mammary carcinoma.  REFERRING PROVIDER: [name], NP  HPI: Patient is a 39 year old female now 2 years having completed whole breast radiation to her right breast and peripheral lymphatics for a T2N0 ER/PR positive invasive mammary carcinoma seen today in routine follow-up she is doing well.  She specifically denies breast tenderness cough bone pain or any swelling in her right upper extremity..  She mammograms last December which I have reviewed were BI-RADS 2 benign.  She is currently on Aromasin tolerant well without side effect.  COMPLICATIONS OF TREATMENT: none  FOLLOW UP COMPLIANCE: keeps appointments   PHYSICAL EXAM:  BP 121/78 (BP Location: Left Arm, Patient Position: Sitting)    Pulse 91    Temp (!) 97.5 F (36.4 C) (Tympanic)    Wt 159 lb 9.6 oz (72.4 kg)    BMI 27.40 kg/m  Lungs are clear to A&P cardiac examination essentially unremarkable with regular rate and rhythm. No dominant mass or nodularity is noted in either breast in 2 positions examined. Incision is well-healed. No axillary or supraclavicular adenopathy is appreciated. Cosmetic result is excellent.  Well-developed well-nourished patient in NAD. HEENT reveals PERLA, EOMI, discs not visualized.  Oral cavity is clear. No oral mucosal lesions are identified. Neck is clear without evidence of cervical or supraclavicular adenopathy. Lungs are clear to A&P. Cardiac examination is essentially unremarkable with regular rate and rhythm without murmur rub or thrill. Abdomen is benign with no organomegaly or masses noted. Motor sensory and DTR levels are equal and symmetric in the upper and lower extremities. Cranial nerves II through XII are grossly intact.  Proprioception is intact. No peripheral adenopathy or edema is identified. No motor or sensory levels are noted. Crude visual fields are within normal range.  RADIOLOGY RESULTS: Mammograms reviewed compatible with above-stated findings  PLAN: Present time patient is doing well 2 years out with no evidence of disease.  And pleased with her overall progress.  I have asked to see her back in 1 year for follow-up.  She continues on Aromasin without side effect.  Patient knows to call with any concerns.  I would like to take this opportunity to thank you for allowing me to participate in the care of your patient.[name], MD

## 2020-05-19 ENCOUNTER — Inpatient Hospital Stay: Payer: 59

## 2020-05-19 ENCOUNTER — Inpatient Hospital Stay: Payer: 59 | Attending: Oncology | Admitting: Oncology

## 2020-05-19 ENCOUNTER — Encounter: Payer: Self-pay | Admitting: Oncology

## 2020-05-19 ENCOUNTER — Other Ambulatory Visit: Payer: Self-pay

## 2020-05-19 VITALS — BP 124/82 | HR 93 | Temp 97.4°F | Resp 18 | Wt 161.3 lb

## 2020-05-19 DIAGNOSIS — E2839 Other primary ovarian failure: Secondary | ICD-10-CM

## 2020-05-19 DIAGNOSIS — Z923 Personal history of irradiation: Secondary | ICD-10-CM | POA: Diagnosis not present

## 2020-05-19 DIAGNOSIS — M8580 Other specified disorders of bone density and structure, unspecified site: Secondary | ICD-10-CM | POA: Insufficient documentation

## 2020-05-19 DIAGNOSIS — Z79899 Other long term (current) drug therapy: Secondary | ICD-10-CM | POA: Insufficient documentation

## 2020-05-19 DIAGNOSIS — C50411 Malignant neoplasm of upper-outer quadrant of right female breast: Secondary | ICD-10-CM | POA: Insufficient documentation

## 2020-05-19 DIAGNOSIS — Z79811 Long term (current) use of aromatase inhibitors: Secondary | ICD-10-CM

## 2020-05-19 DIAGNOSIS — Z170 Estrogen receptor positive status [ER+]: Secondary | ICD-10-CM

## 2020-05-19 LAB — COMPREHENSIVE METABOLIC PANEL
ALT: 14 U/L (ref 0–44)
AST: 18 U/L (ref 15–41)
Albumin: 4.3 g/dL (ref 3.5–5.0)
Alkaline Phosphatase: 50 U/L (ref 38–126)
Anion gap: 7 (ref 5–15)
BUN: 19 mg/dL (ref 6–20)
CO2: 29 mmol/L (ref 22–32)
Calcium: 9.2 mg/dL (ref 8.9–10.3)
Chloride: 102 mmol/L (ref 98–111)
Creatinine, Ser: 0.68 mg/dL (ref 0.44–1.00)
GFR, Estimated: >60 mL/min (ref >60)
Glucose, Bld: 101 mg/dL — ABNORMAL HIGH (ref 70–99)
Potassium: 4 mmol/L (ref 3.5–5.1)
Sodium: 138 mmol/L (ref 135–145)
Total Bilirubin: 0.5 mg/dL (ref 0.3–1.2)
Total Protein: 7.3 g/dL (ref 6.5–8.1)

## 2020-05-19 LAB — CBC WITH DIFFERENTIAL/PLATELET
Abs Immature Granulocytes: 0.01 10*3/uL (ref 0.00–0.07)
Basophils Absolute: 0 10*3/uL (ref 0.0–0.1)
Basophils Relative: 0 %
Eosinophils Absolute: 0.1 10*3/uL (ref 0.0–0.5)
Eosinophils Relative: 1 %
HCT: 38.2 % (ref 36.0–46.0)
Hemoglobin: 13 g/dL (ref 12.0–15.0)
Immature Granulocytes: 0 %
Lymphocytes Relative: 26 %
Lymphs Abs: 1.9 10*3/uL (ref 0.7–4.0)
MCH: 30 pg (ref 26.0–34.0)
MCHC: 34 g/dL (ref 30.0–36.0)
MCV: 88.2 fL (ref 80.0–100.0)
Monocytes Absolute: 0.5 10*3/uL (ref 0.1–1.0)
Monocytes Relative: 7 %
Neutro Abs: 4.8 10*3/uL (ref 1.7–7.7)
Neutrophils Relative %: 66 %
Platelets: 265 10*3/uL (ref 150–400)
RBC: 4.33 MIL/uL (ref 3.87–5.11)
RDW: 11.2 % — ABNORMAL LOW (ref 11.5–15.5)
WBC: 7.3 10*3/uL (ref 4.0–10.5)
nRBC: 0 % (ref 0.0–0.2)

## 2020-05-19 MED ORDER — SODIUM CHLORIDE 0.9 % IV SOLN
Freq: Once | INTRAVENOUS | Status: AC
  Filled 2020-05-19: qty 250

## 2020-05-19 MED ORDER — ZOLEDRONIC ACID 4 MG/100ML IV SOLN
4.0000 mg | Freq: Once | INTRAVENOUS | Status: AC
  Administered 2020-05-19: 4 mg via INTRAVENOUS
  Filled 2020-05-19: qty 100

## 2020-05-19 MED ORDER — GOSERELIN ACETATE 3.6 MG ~~LOC~~ IMPL
3.6000 mg | DRUG_IMPLANT | Freq: Once | SUBCUTANEOUS | Status: AC
  Administered 2020-05-19: 3.6 mg via SUBCUTANEOUS
  Filled 2020-05-19: qty 3.6

## 2020-05-19 NOTE — Progress Notes (Signed)
Pt here for follow up. No new concerns voiced.   

## 2020-05-19 NOTE — Progress Notes (Signed)
Hematology/Oncology Follow up note [facility] [phone] [phone]) [phone]   Patient Care Team: [name], NP as PCP - General (Nurse Practitioner) Theodore Demark, RN as Oncology Nurse Navigator [name], [name], MD as Referring Physician (Surgery) [name], MD as Consulting Physician (Oncology) [name], MD as Consulting Physician (General Surgery) [name], MD as Referring Physician (Radiation Oncology)  REASON FOR VISIT Follow up for management of breast cancer   HISTORY OF PRESENTING ILLNESS:  Anne Wu is a  39 y.o.  female with diagnosis of cT2N0M0, grade 2 invasive mammary carcinoma.  She has significant family history on both maternal side as well as paternal side. She felt a right breast mass 2 months ago and diagnostic mammogram showed right upper quandrant 2.2 cm mass, which was confirmed on [place] and biopsied. Right axillary no clinically suspicious lymph node. Pathology showed invasive mammary carcinoma, ER/PR positive,, HER2 IHC  equivocal, FISH negative.   # Patient was evaluated by Dr.Smith. From surgical standpoint, Dr.Smith recommend neoadjuvant chemotherapy to facilitate surgery to obtain negative margin and better surgical outcome. Also given patient's young age, neoadjuvant chemotherapy is reasonable.    #Patient stated that she does not desire future fertility.  # 2D Echo showed normal systolic function, mild mitral regurgitation.  # CT was done  to rule out metastatic disease given micrometastasis. Images were independently reviewed by me and discussed with patient.  Negative for metastatic disease. # Testing did not reveal any pathogenic mutation in any of these genes. A copy of the genetic test report will be scanned into Epic under the Media tab.The genes analyzed were the 23 genes on [facility]'s Breast/GYN panel (ATM, BARD1, BRCA1, BRCA2, BRIP1, CDH1, CHEK2, DICER1, EPCAM, MLH1,  MSH2, MSH6,  NBN, NF1, PALB2, PMS2, PTEN, RAD50, RAD51C, RAD51D,SMARCA4, STK11, and TP53). 09/08/2017 Interval mammogram after 4 cycle of AC, showed good treatment response. The biopsy-proven malignancy in the upper outer quadrant of the right breast at posterior depth, associated with scattered microcalcifications and architectural distortion, has significantly decreased in size since the mammogram 06/20/2017. On today's mammogram, the mass measures approximately 1.7 x 2.2 x 1.2 cm (previously 2.4 x 1.6 x 2.7 cm).   Treatment Summary :  Jan 2019- May 2019 Neoadjuvant AC-->T  12/29/2017 Right lumpectomy and sentinel LN biopsy Pathology showed ypT2 ypN0 (i+) (sn) case was discussed on tumor board.ER/PR positive, HER 2 negative.  Aug 2019-September 2019 Adjuvant RT  Oct 2019 to start Zoladex and Aromasin. Mediport was removed.  INTERVAL HISTORY Anne Wu is a 39 y.o. female who has above history reviewed by me presents for follow up for breast cancer management  Patient takes Aromasin daily.  As well as ovarian suppression with monthly Zoladex. Tolerates well.  No new complaint.   Review of Systems  Constitutional: Negative for appetite change, chills, fatigue and fever.  HENT:   Negative for hearing loss and voice change.   Eyes: Negative for eye problems.  Respiratory: Negative for chest tightness and cough.   Cardiovascular: Negative for chest pain.  Gastrointestinal: Negative for abdominal distention, abdominal pain and blood in stool.  Endocrine: Positive for hot flashes.  Genitourinary: Negative for difficulty urinating and frequency.   Musculoskeletal: Positive for arthralgias.  Skin: Negative for itching and rash.  Neurological: Negative for dizziness, extremity weakness and light-headedness.  Hematological: Negative for adenopathy.  Psychiatric/Behavioral: Negative for confusion. The patient is not nervous/anxious.     MEDICAL HISTORY:  Past Medical History:  Diagnosis Date  .  Asthma     AS A CHILD-NO INHALERS  . Breast cancer ([place]) 2018   Right breast cancer-[facility]  . Elevated blood pressure, situational    PT STATES HER LAST COUPLE OF MD APPOINTMENTS SHE HAS HAD ELEVATED BP-NEVER HAD A PROBLEM WITH THIS PREVIOUSLY  . Genetic testing 09/05/2017   Breast/GYN panel (23 genes) @ [facility] - No pathogenic mutations detected  . GERD (gastroesophageal reflux disease)    OCC- NO MEDS  . Malignant neoplasm of upper-outer quadrant of right breast in female, estrogen receptor positive ([place]) 2019   [facility] and DCIS  . Personal history of chemotherapy   . Personal history of radiation therapy     SURGICAL HISTORY: Past Surgical History:  Procedure Laterality Date  . BREAST BIOPSY Right 06/20/2017   Invasive Mammary Carcinoma  . BREAST EXCISIONAL BIOPSY Right 12/29/2017   RESIDUAL INVASIVE MAMMARY CARCINOMA and DCIS  . BREAST LUMPECTOMY Right 12/29/2017   needle localization and sentinel node injection  . NO PAST SURGERIES    . PARTIAL MASTECTOMY WITH NEEDLE LOCALIZATION Right 12/29/2017   Procedure: PARTIAL MASTECTOMY WITH NEEDLE LOCALIZATION;  Surgeon: [name], MD;  Location: [facility] ORS;  Service: General;  Laterality: Right;  . PORTACATH PLACEMENT N/A 07/11/2017   Procedure: INSERTION PORT-A-CATH;  Surgeon: Leonie Green, MD;  Location: [facility] ORS;  Service: General;  Laterality: N/A;  . SENTINEL NODE BIOPSY Right 12/29/2017   Procedure: SENTINEL NODE BIOPSY;  Surgeon: [name], MD;  Location: [facility] ORS;  Service: General;  Laterality: Right;    SOCIAL HISTORY: Social History   Socioeconomic History  . Marital status: Married    Spouse name: Not on file  . Number of children: Not on file  . Years of education: Not on file  . Highest education level: Not on file  Occupational History  . Not on file  Tobacco Use  . Smoking status: Never Smoker  . Smokeless tobacco: Never Used  Vaping Use  . Vaping Use: Never used  Substance and Sexual  Activity  . Alcohol use: No    Alcohol/week: 0.0 standard drinks  . Drug use: No  . Sexual activity: Yes  Other Topics Concern  . Not on file  Social History Narrative  . Not on file   Social Determinants of Health   Financial Resource Strain:   . Difficulty of Paying Living Expenses: Not on file  Food Insecurity:   . Worried About Charity fundraiser in the Last Year: Not on file  . Ran Out of Food in the Last Year: Not on file  Transportation Needs:   . Lack of Transportation (Medical): Not on file  . Lack of Transportation (Non-Medical): Not on file  Physical Activity:   . Days of Exercise per Week: Not on file  . Minutes of Exercise per Session: Not on file  Stress:   . Feeling of Stress : Not on file  Social Connections:   . Frequency of Communication with Friends and Family: Not on file  . Frequency of Social Gatherings with Friends and Family: Not on file  . Attends Religious Services: Not on file  . Active Member of Clubs or Organizations: Not on file  . Attends Archivist Meetings: Not on file  . Marital Status: Not on file  Intimate Partner Violence:   . Fear of Current or Ex-Partner: Not on file  . Emotionally Abused: Not on file  . Physically Abused: Not on file  . Sexually Abused: Not on file  FAMILY HISTORY: Family History  Problem Relation Age of Onset  . Breast cancer Maternal Aunt 23       currently late 43s  . Breast cancer Maternal Grandmother 68       second breast vs. recurrence ca at 36; deceased at 63  . Colon cancer Maternal Uncle 63       deceased in 9s  . Breast cancer Paternal Aunt        age at dx unknown; currently in 39s  . Breast cancer Paternal Grandmother        age at dx unknown  . Melanoma Mother        on leg  . Testicular cancer Maternal Uncle 67       currently 56s    ALLERGIES:  is allergic to codeine.  MEDICATIONS:  Current Outpatient Medications  Medication Sig Dispense Refill  . acetaminophen  (TYLENOL) 325 MG tablet Take 650 mg by mouth every 6 (six) hours as needed for moderate pain or headache.     . Calcium Carbonate-Vit D-Min (CALCIUM 1200 PO) Take 1 tablet by mouth daily.    . cholecalciferol (VITAMIN D) 1000 units tablet Take 1,000 Units by mouth daily.    Marland Kitchen ELDERBERRY PO Take 1 tablet by mouth daily.    Marland Kitchen exemestane (AROMASIN) 25 MG tablet TAKE 1 TABLET (25 MG TOTAL) BY MOUTH DAILY AFTER BREAKFAST. 90 tablet 3  . lidocaine-prilocaine (EMLA) cream APPLY TO AFFECTED AREA ONCE 30 g 3  . Multiple Vitamin (MULTI-VITAMINS) TABS Take 1 tablet by mouth daily.     Marland Kitchen loratadine (CLARITIN) 10 MG tablet Take 10 mg by mouth daily as needed for allergies.  (Patient not taking: Reported on 05/19/2020)     No current facility-administered medications for this visit.   Facility-Administered Medications Ordered in Other Visits  Medication Dose Route Frequency Provider Last Rate Last Admin  . heparin lock flush 100 unit/mL  500 Units Intravenous Once [name], MD      . sodium chloride flush (NS) 0.9 % injection 10 mL  10 mL Intravenous PRN [name], MD   10 mL at 08/01/17 0829     PHYSICAL EXAMINATION: ECOG PERFORMANCE STATUS: 1 - Symptomatic but completely ambulatory Vitals:   05/19/20 1425  BP: 124/82  Pulse: 93  Resp: 18  Temp: (!) 97.4 F (36.3 C)   Filed Weights   05/19/20 1425  Weight: 161 lb 4.8 oz (73.2 kg)    Physical Exam Constitutional:      General: She is not in acute distress.    Appearance: She is not diaphoretic.  HENT:     Head: Normocephalic and atraumatic.     Nose: Nose normal.     Mouth/Throat:     Pharynx: No oropharyngeal exudate.  Eyes:     General: No scleral icterus.    Pupils: Pupils are equal, round, and reactive to light.  Neck:     Vascular: No JVD.  Cardiovascular:     Rate and Rhythm: Normal rate and regular rhythm.     Heart sounds: Normal heart sounds. No murmur heard.  No friction rub.  Pulmonary:     Effort: Pulmonary effort is  normal. No respiratory distress.     Breath sounds: Normal breath sounds. No rales.  Chest:     Chest wall: No tenderness.  Abdominal:     General: Bowel sounds are normal. There is no distension.     Palpations: Abdomen is soft.  Tenderness: There is no abdominal tenderness.  Musculoskeletal:        General: No tenderness or deformity. Normal range of motion.     Cervical back: Normal range of motion and neck supple.  Lymphadenopathy:     Cervical: No cervical adenopathy.  Skin:    General: Skin is warm and dry.     Findings: No erythema.  Neurological:     Mental Status: She is alert and oriented to person, place, and time.     Cranial Nerves: No cranial nerve deficit.     Motor: No abnormal muscle tone.     Coordination: Coordination normal.  Psychiatric:        Mood and Affect: Affect normal.    Breast exam was performed in seated position. Patient is status post right breast lumpectomy with a well-healed surgical scar. No evidence of any right palpable masses. No palpable right axillary adenopathy. No palpable masses or lumps in the left breast. No evidence of left axillary adenopathy Right outer lower quadrant skin discoloration-bruising has resolved.  Chronic right breast skin telangiectasia unchanged. Right breast upper inner quadrant skin chronic discoloration.        Lab Results  Component Value Date   WBC 7.3 05/19/2020   HGB 13.0 05/19/2020   HCT 38.2 05/19/2020   MCV 88.2 05/19/2020   PLT 265 05/19/2020   Recent Labs    07/28/19 1310 07/28/19 1310 11/25/19 1020 02/25/20 1311 05/19/20 1354  NA 135   < > 138 138 138  K 3.9   < > 4.5 4.2 4.0  CL 100   < > 101 102 102  CO2 27   < > _0 GLUCOSE 126*   < > 98 108* 101*  BUN 21*   < > _1 CREATININE 0.53   < > 0.59 0.49 0.68  CALCIUM 9.2   < > 9.5 8.8* 9.2  GFRNONAA >60   < > >60 >60 >60  GFRAA >60  --  >60 >60  --   PROT 7.2   < > 7.6 7.1 7.3  ALBUMIN 4.4   < > 4.4 4.2 4.3  AST 19    < > _2 ALT 12   < > _3 ALKPHOS 54   < > 50 39 50  BILITOT 0.3   < > 0.7 0.4 0.5   < > = values in this interval not displayed.   RADIOGRAPHIC STUDIES: I have personally reviewed the radiological images as listed and agreed with the findings in the report. No results found.  ASSESSMENT & PLAN:  1. Malignant neoplasm of upper-outer quadrant of right breast in female, estrogen receptor positive ([place])   2. Aromatase inhibitor use   3. Suppression of ovarian secretion   4. Osteopenia, unspecified location   Cancer Staging Malignant neoplasm of upper-outer quadrant of right breast in female, estrogen receptor positive ([place]) Staging form: Breast, AJCC 8th Edition - Clinical stage from 07/15/2017: Stage IB (cT2, cN0, cM0, G2, ER: Positive, PR: Positive, HER2: Negative) - Signed by [name], MD on 07/15/2017 #ypT2 ypN0 (i+) (sn), grade 2 breast cancer, ER/PR positive, HER 2 negative. Labs are reviewed and discussed with patient. Patient has been doing well clinically. Continue Aromasin, and monthly Zoladex.  Due for mammogram in Dec 2021.   #Chronic aromatase inhibitor use, July 2020 DEXA showed osteopenia.  Continue calcium and vitamin D supplementation.  Continue Zometa every 6 months.  Proceed with  Zometa today. She understands that no dental work recommended within 6 weeks of zometa.   # chronic skin discoloration of the breast skin. Probably due to post radiation changes.  I ask patient to see Dr.Cintron for evaluation of biopsy.   We spent sufficient time to discuss many aspect of care, questions were answered to patient's satisfaction. The patient knows to call the clinic with any problems questions or concerns.  Return visit: She will follow-up monthly for Zoladex injections, obtain CBC, CMP and follow-up in the clinic in 3 months   [name], MD, PhD Hematology Oncology [facility] Pager- [phone] 05/19/2020

## 2020-05-19 NOTE — Progress Notes (Signed)
Pt d/ced home after tx, discussed insurance issues and sent message to billing to please look into insurance request for diff. Codes, VSS

## 2020-06-05 ENCOUNTER — Other Ambulatory Visit: Payer: Self-pay | Admitting: Oncology

## 2020-06-05 MED ORDER — EXEMESTANE 25 MG PO TABS: 25.0000 mg | ORAL_TABLET | Freq: Every day | ORAL | 1 refills | Status: DC

## 2020-06-07 ENCOUNTER — Other Ambulatory Visit: Payer: Self-pay | Admitting: Oncology

## 2020-06-07 DIAGNOSIS — C50411 Malignant neoplasm of upper-outer quadrant of right female breast: Secondary | ICD-10-CM

## 2020-06-07 DIAGNOSIS — Z170 Estrogen receptor positive status [ER+]: Secondary | ICD-10-CM

## 2020-06-19 ENCOUNTER — Inpatient Hospital Stay: Payer: 59 | Attending: Oncology

## 2020-06-19 ENCOUNTER — Other Ambulatory Visit: Payer: Self-pay

## 2020-06-19 VITALS — BP 113/78 | HR 90 | Resp 16

## 2020-06-19 DIAGNOSIS — M8580 Other specified disorders of bone density and structure, unspecified site: Secondary | ICD-10-CM | POA: Diagnosis not present

## 2020-06-19 DIAGNOSIS — C50411 Malignant neoplasm of upper-outer quadrant of right female breast: Secondary | ICD-10-CM | POA: Insufficient documentation

## 2020-06-19 DIAGNOSIS — Z923 Personal history of irradiation: Secondary | ICD-10-CM | POA: Diagnosis not present

## 2020-06-19 DIAGNOSIS — Z170 Estrogen receptor positive status [ER+]: Secondary | ICD-10-CM | POA: Insufficient documentation

## 2020-06-19 DIAGNOSIS — Z79899 Other long term (current) drug therapy: Secondary | ICD-10-CM | POA: Insufficient documentation

## 2020-06-19 MED ORDER — GOSERELIN ACETATE 3.6 MG ~~LOC~~ IMPL
3.6000 mg | DRUG_IMPLANT | Freq: Once | SUBCUTANEOUS | Status: AC
  Administered 2020-06-19: 3.6 mg via SUBCUTANEOUS
  Filled 2020-06-19: qty 3.6

## 2020-06-26 ENCOUNTER — Ambulatory Visit
Admission: RE | Admit: 2020-06-26 | Discharge: 2020-06-26 | Disposition: A | Discharge status: Home / Self Care | Payer: 59 | Source: Ambulatory Visit | Attending: General Surgery | Admitting: General Surgery

## 2020-06-26 ENCOUNTER — Other Ambulatory Visit: Payer: Self-pay

## 2020-06-26 DIAGNOSIS — Z853 Personal history of malignant neoplasm of breast: Secondary | ICD-10-CM | POA: Diagnosis not present

## 2020-07-20 ENCOUNTER — Inpatient Hospital Stay: Payer: 59 | Attending: Oncology

## 2020-07-20 DIAGNOSIS — M8580 Other specified disorders of bone density and structure, unspecified site: Secondary | ICD-10-CM | POA: Insufficient documentation

## 2020-07-20 DIAGNOSIS — Z79899 Other long term (current) drug therapy: Secondary | ICD-10-CM | POA: Diagnosis not present

## 2020-07-20 DIAGNOSIS — Z170 Estrogen receptor positive status [ER+]: Secondary | ICD-10-CM | POA: Diagnosis not present

## 2020-07-20 DIAGNOSIS — I340 Nonrheumatic mitral (valve) insufficiency: Secondary | ICD-10-CM | POA: Insufficient documentation

## 2020-07-20 DIAGNOSIS — Z808 Family history of malignant neoplasm of other organs or systems: Secondary | ICD-10-CM | POA: Diagnosis not present

## 2020-07-20 DIAGNOSIS — C50412 Malignant neoplasm of upper-outer quadrant of left female breast: Secondary | ICD-10-CM | POA: Insufficient documentation

## 2020-07-20 DIAGNOSIS — Z8043 Family history of malignant neoplasm of testis: Secondary | ICD-10-CM | POA: Diagnosis not present

## 2020-07-20 DIAGNOSIS — Z803 Family history of malignant neoplasm of breast: Secondary | ICD-10-CM | POA: Insufficient documentation

## 2020-07-20 DIAGNOSIS — Z79811 Long term (current) use of aromatase inhibitors: Secondary | ICD-10-CM | POA: Diagnosis not present

## 2020-07-20 DIAGNOSIS — Z800 Family history of malignant neoplasm of digestive organs: Secondary | ICD-10-CM | POA: Diagnosis not present

## 2020-07-20 DIAGNOSIS — R232 Flushing: Secondary | ICD-10-CM | POA: Insufficient documentation

## 2020-07-20 DIAGNOSIS — Z5111 Encounter for antineoplastic chemotherapy: Secondary | ICD-10-CM | POA: Diagnosis not present

## 2020-07-20 DIAGNOSIS — C50411 Malignant neoplasm of upper-outer quadrant of right female breast: Secondary | ICD-10-CM

## 2020-07-20 DIAGNOSIS — M2550 Pain in unspecified joint: Secondary | ICD-10-CM | POA: Diagnosis not present

## 2020-07-20 MED ORDER — GOSERELIN ACETATE 3.6 MG ~~LOC~~ IMPL
3.6000 mg | DRUG_IMPLANT | Freq: Once | SUBCUTANEOUS | Status: AC
  Administered 2020-07-20: 3.6 mg via SUBCUTANEOUS
  Filled 2020-07-20: qty 3.6

## 2020-08-22 ENCOUNTER — Encounter: Payer: Self-pay | Admitting: Oncology

## 2020-08-22 ENCOUNTER — Other Ambulatory Visit: Payer: Self-pay

## 2020-08-22 DIAGNOSIS — Z170 Estrogen receptor positive status [ER+]: Secondary | ICD-10-CM

## 2020-08-22 DIAGNOSIS — C50411 Malignant neoplasm of upper-outer quadrant of right female breast: Secondary | ICD-10-CM

## 2020-08-25 ENCOUNTER — Inpatient Hospital Stay: Payer: 59

## 2020-08-25 ENCOUNTER — Encounter: Payer: Self-pay | Admitting: Oncology

## 2020-08-25 ENCOUNTER — Inpatient Hospital Stay: Payer: 59 | Attending: Oncology

## 2020-08-25 ENCOUNTER — Other Ambulatory Visit: Payer: Self-pay

## 2020-08-25 ENCOUNTER — Inpatient Hospital Stay (HOSPITAL_BASED_OUTPATIENT_CLINIC_OR_DEPARTMENT_OTHER): Payer: 59 | Admitting: Oncology

## 2020-08-25 VITALS — BP 110/74 | HR 97 | Temp 98.3°F | Resp 18 | Wt 166.5 lb

## 2020-08-25 DIAGNOSIS — Z923 Personal history of irradiation: Secondary | ICD-10-CM | POA: Insufficient documentation

## 2020-08-25 DIAGNOSIS — I340 Nonrheumatic mitral (valve) insufficiency: Secondary | ICD-10-CM | POA: Insufficient documentation

## 2020-08-25 DIAGNOSIS — Z79811 Long term (current) use of aromatase inhibitors: Secondary | ICD-10-CM | POA: Diagnosis not present

## 2020-08-25 DIAGNOSIS — Z803 Family history of malignant neoplasm of breast: Secondary | ICD-10-CM | POA: Diagnosis not present

## 2020-08-25 DIAGNOSIS — Z885 Allergy status to narcotic agent status: Secondary | ICD-10-CM | POA: Insufficient documentation

## 2020-08-25 DIAGNOSIS — C50411 Malignant neoplasm of upper-outer quadrant of right female breast: Secondary | ICD-10-CM | POA: Diagnosis present

## 2020-08-25 DIAGNOSIS — E2839 Other primary ovarian failure: Secondary | ICD-10-CM | POA: Diagnosis not present

## 2020-08-25 DIAGNOSIS — Z9221 Personal history of antineoplastic chemotherapy: Secondary | ICD-10-CM | POA: Insufficient documentation

## 2020-08-25 DIAGNOSIS — Z79899 Other long term (current) drug therapy: Secondary | ICD-10-CM | POA: Diagnosis not present

## 2020-08-25 DIAGNOSIS — Z170 Estrogen receptor positive status [ER+]: Secondary | ICD-10-CM

## 2020-08-25 DIAGNOSIS — M2550 Pain in unspecified joint: Secondary | ICD-10-CM | POA: Diagnosis not present

## 2020-08-25 DIAGNOSIS — Z5111 Encounter for antineoplastic chemotherapy: Secondary | ICD-10-CM | POA: Diagnosis present

## 2020-08-25 DIAGNOSIS — Z8043 Family history of malignant neoplasm of testis: Secondary | ICD-10-CM | POA: Insufficient documentation

## 2020-08-25 DIAGNOSIS — Z808 Family history of malignant neoplasm of other organs or systems: Secondary | ICD-10-CM | POA: Diagnosis not present

## 2020-08-25 DIAGNOSIS — M8580 Other specified disorders of bone density and structure, unspecified site: Secondary | ICD-10-CM

## 2020-08-25 DIAGNOSIS — Z800 Family history of malignant neoplasm of digestive organs: Secondary | ICD-10-CM | POA: Diagnosis not present

## 2020-08-25 DIAGNOSIS — R232 Flushing: Secondary | ICD-10-CM | POA: Diagnosis not present

## 2020-08-25 LAB — CBC WITH DIFFERENTIAL/PLATELET
Abs Immature Granulocytes: 0.01 10*3/uL (ref 0.00–0.07)
Basophils Absolute: 0 10*3/uL (ref 0.0–0.1)
Basophils Relative: 0 %
Eosinophils Absolute: 0.1 10*3/uL (ref 0.0–0.5)
Eosinophils Relative: 1 %
HCT: 38.1 % (ref 36.0–46.0)
Hemoglobin: 13.1 g/dL (ref 12.0–15.0)
Immature Granulocytes: 0 %
Lymphocytes Relative: 25 %
Lymphs Abs: 1.8 10*3/uL (ref 0.7–4.0)
MCH: 30.1 pg (ref 26.0–34.0)
MCHC: 34.4 g/dL (ref 30.0–36.0)
MCV: 87.6 fL (ref 80.0–100.0)
Monocytes Absolute: 0.4 10*3/uL (ref 0.1–1.0)
Monocytes Relative: 5 %
Neutro Abs: 4.9 10*3/uL (ref 1.7–7.7)
Neutrophils Relative %: 69 %
Platelets: 249 10*3/uL (ref 150–400)
RBC: 4.35 MIL/uL (ref 3.87–5.11)
RDW: 11.4 % — ABNORMAL LOW (ref 11.5–15.5)
WBC: 7.2 10*3/uL (ref 4.0–10.5)
nRBC: 0 % (ref 0.0–0.2)

## 2020-08-25 LAB — COMPREHENSIVE METABOLIC PANEL
ALT: 13 U/L (ref 0–44)
AST: 19 U/L (ref 15–41)
Albumin: 4.1 g/dL (ref 3.5–5.0)
Alkaline Phosphatase: 49 U/L (ref 38–126)
Anion gap: 9 (ref 5–15)
BUN: 22 mg/dL — ABNORMAL HIGH (ref 6–20)
CO2: 27 mmol/L (ref 22–32)
Calcium: 9.1 mg/dL (ref 8.9–10.3)
Chloride: 101 mmol/L (ref 98–111)
Creatinine, Ser: 0.54 mg/dL (ref 0.44–1.00)
GFR, Estimated: >60 mL/min (ref >60)
Glucose, Bld: 104 mg/dL — ABNORMAL HIGH (ref 70–99)
Potassium: 4 mmol/L (ref 3.5–5.1)
Sodium: 137 mmol/L (ref 135–145)
Total Bilirubin: 0.5 mg/dL (ref 0.3–1.2)
Total Protein: 7.3 g/dL (ref 6.5–8.1)

## 2020-08-25 LAB — VITAMIN D 25 HYDROXY (VIT D DEFICIENCY, FRACTURES): Vit D, 25-Hydroxy: 47.53 ng/mL (ref 30–100)

## 2020-08-25 MED ORDER — GOSERELIN ACETATE 3.6 MG ~~LOC~~ IMPL
3.6000 mg | DRUG_IMPLANT | Freq: Once | SUBCUTANEOUS | Status: AC
  Administered 2020-08-25: 3.6 mg via SUBCUTANEOUS
  Filled 2020-08-25: qty 3.6

## 2020-08-25 MED ORDER — EXEMESTANE 25 MG PO TABS: 25.0000 mg | ORAL_TABLET | Freq: Every day | ORAL | 1 refills | Status: DC

## 2020-08-25 MED ORDER — LIDOCAINE-PRILOCAINE 2.5-2.5 % EX CREA: 1.0000 "application " | TOPICAL_CREAM | CUTANEOUS | 3 refills | Status: DC | PRN

## 2020-08-25 NOTE — Progress Notes (Signed)
Hematology/Oncology Follow up note [facility] [phone] Fax:([phone]   Patient Care Team: [name], NP as PCP - General (Nurse Practitioner) Theodore Demark, RN as Oncology Nurse Navigator [name], [name], MD as Referring Physician (Surgery) [name], MD as Consulting Physician (Oncology) [name], MD as Consulting Physician (General Surgery) [name], MD as Referring Physician (Radiation Oncology)  REASON FOR VISIT Follow up for management of breast cancer   HISTORY OF PRESENTING ILLNESS:  Anne Wu is a  40 y.o.  female with diagnosis of cT2N0M0, grade 2 invasive mammary carcinoma.  She has significant family history on both maternal side as well as paternal side. She felt a right breast mass 2 months ago and diagnostic mammogram showed right upper quandrant 2.2 cm mass, which was confirmed on [place] and biopsied. Right axillary no clinically suspicious lymph node. Pathology showed invasive mammary carcinoma, ER/PR positive,, HER2 IHC  equivocal, FISH negative.   # Patient was evaluated by Dr.Smith. From surgical standpoint, Dr.Smith recommend neoadjuvant chemotherapy to facilitate surgery to obtain negative margin and better surgical outcome. Also given patient's young age, neoadjuvant chemotherapy is reasonable.    #Patient stated that she does not desire future fertility.  # 2D Echo showed normal systolic function, mild mitral regurgitation.  # CT was done  to rule out metastatic disease given micrometastasis. Images were independently reviewed by me and discussed with patient.  Negative for metastatic disease. # Testing did not reveal any pathogenic mutation in any of these genes. A copy of the genetic test report will be scanned into Epic under the Media tab.The genes analyzed were the 23 genes on [facility]'s Breast/GYN panel (ATM, BARD1, BRCA1, BRCA2, BRIP1, CDH1, CHEK2, DICER1, EPCAM, MLH1,  MSH2, MSH6,  NBN, NF1, PALB2, PMS2, PTEN, RAD50, RAD51C, RAD51D,SMARCA4, STK11, and TP53). 09/08/2017 Interval mammogram after 4 cycle of AC, showed good treatment response. The biopsy-proven malignancy in the upper outer quadrant of the right breast at posterior depth, associated with scattered microcalcifications and architectural distortion, has significantly decreased in size since the mammogram 06/20/2017. On today's mammogram, the mass measures approximately 1.7 x 2.2 x 1.2 cm (previously 2.4 x 1.6 x 2.7 cm).   Treatment Summary :  Jan 2019- May 2019 Neoadjuvant AC-->T  12/29/2017 Right lumpectomy and sentinel LN biopsy Pathology showed ypT2 ypN0 (i+) (sn) case was discussed on tumor board.ER/PR positive, HER 2 negative.  Aug 2019-September 2019 Adjuvant RT  Oct 2019 to start Zoladex and Aromasin. Mediport was removed.  INTERVAL HISTORY Anne Wu is a 40 y.o. female who has above history reviewed by me presents for follow up for breast cancer management  Patient takes Aromasin daily.  As well as ovarian suppression with monthly Zoladex. Patient tolerates well.  No new complaints today.  Patient requests refill of EMLA cream which she will apply topically prior to Zoladex injection.  Review of Systems  Constitutional: Negative for appetite change, chills, fatigue and fever.  HENT:   Negative for hearing loss and voice change.   Eyes: Negative for eye problems.  Respiratory: Negative for chest tightness and cough.   Cardiovascular: Negative for chest pain.  Gastrointestinal: Negative for abdominal distention, abdominal pain and blood in stool.  Endocrine: Positive for hot flashes.  Genitourinary: Negative for difficulty urinating and frequency.   Musculoskeletal: Positive for arthralgias.  Skin: Negative for itching and rash.  Neurological: Negative for dizziness, extremity weakness and light-headedness.  Hematological: Negative for adenopathy.  Psychiatric/Behavioral: Negative for confusion.  The patient  is not nervous/anxious.     MEDICAL HISTORY:  Past Medical History:  Diagnosis Date  . Asthma    AS A CHILD-NO INHALERS  . Breast cancer ([employer]) 2018   Right breast cancer-[facility]  . Elevated blood pressure, situational    PT STATES HER LAST COUPLE OF MD APPOINTMENTS SHE HAS HAD ELEVATED BP-NEVER HAD A PROBLEM WITH THIS PREVIOUSLY  . Genetic testing 09/05/2017   Breast/GYN panel (23 genes) @ [facility] - No pathogenic mutations detected  . GERD (gastroesophageal reflux disease)    OCC- NO MEDS  . Malignant neoplasm of upper-outer quadrant of right breast in female, estrogen receptor positive ([place]) 2019   [facility] and DCIS  . Personal history of chemotherapy   . Personal history of radiation therapy     SURGICAL HISTORY: Past Surgical History:  Procedure Laterality Date  . BREAST BIOPSY Right 06/20/2017   Invasive Mammary Carcinoma  . BREAST EXCISIONAL BIOPSY Right 12/29/2017   RESIDUAL INVASIVE MAMMARY CARCINOMA and DCIS  . BREAST LUMPECTOMY Right 12/29/2017   needle localization and sentinel node injection  . NO PAST SURGERIES    . PARTIAL MASTECTOMY WITH NEEDLE LOCALIZATION Right 12/29/2017   Procedure: PARTIAL MASTECTOMY WITH NEEDLE LOCALIZATION;  Surgeon: [name], MD;  Location: [facility] ORS;  Service: General;  Laterality: Right;  . PORTACATH PLACEMENT N/A 07/11/2017   Procedure: INSERTION PORT-A-CATH;  Surgeon: Leonie Green, MD;  Location: [facility] ORS;  Service: General;  Laterality: N/A;  . SENTINEL NODE BIOPSY Right 12/29/2017   Procedure: SENTINEL NODE BIOPSY;  Surgeon: [name], MD;  Location: [facility] ORS;  Service: General;  Laterality: Right;    SOCIAL HISTORY: Social History   Socioeconomic History  . Marital status: Married    Spouse name: Not on file  . Number of children: Not on file  . Years of education: Not on file  . Highest education level: Not on file  Occupational History  . Not on file  Tobacco Use  . Smoking status:  Never Smoker  . Smokeless tobacco: Never Used  Vaping Use  . Vaping Use: Never used  Substance and Sexual Activity  . Alcohol use: No    Alcohol/week: 0.0 standard drinks  . Drug use: No  . Sexual activity: Yes  Other Topics Concern  . Not on file  Social History Narrative  . Not on file   Social Determinants of Health   Financial Resource Strain: Not on file  Food Insecurity: Not on file  Transportation Needs: Not on file  Physical Activity: Not on file  Stress: Not on file  Social Connections: Not on file  Intimate Partner Violence: Not on file    FAMILY HISTORY: Family History  Problem Relation Age of Onset  . Breast cancer Maternal Aunt 33       currently late 69s  . Breast cancer Maternal Grandmother 68       second breast vs. recurrence ca at 29; deceased at 74  . Colon cancer Maternal Uncle 40       deceased in 25s  . Breast cancer Paternal Aunt        age at dx unknown; currently in 70s  . Breast cancer Paternal Grandmother        age at dx unknown  . Melanoma Mother        on leg  . Testicular cancer Maternal Uncle 18       currently 66s    ALLERGIES:  is allergic to codeine.  MEDICATIONS:  Current  Outpatient Medications  Medication Sig Dispense Refill  . acetaminophen (TYLENOL) 325 MG tablet Take 650 mg by mouth every 6 (six) hours as needed for moderate pain or headache.     . Calcium Carbonate-Vit D-Min (CALCIUM 1200 PO) Take 1 tablet by mouth daily.    . cholecalciferol (VITAMIN D) 1000 units tablet Take 1,000 Units by mouth daily.    Marland Kitchen ELDERBERRY PO Take 1 tablet by mouth daily.    Marland Kitchen exemestane (AROMASIN) 25 MG tablet Take 1 tablet (25 mg total) by mouth daily after breakfast. 90 tablet 1  . Multiple Vitamin (MULTI-VITAMINS) TABS Take 1 tablet by mouth daily.     Marland Kitchen loratadine (CLARITIN) 10 MG tablet Take 10 mg by mouth daily as needed for allergies.  (Patient not taking: No sig reported)     No current facility-administered medications for this  visit.   Facility-Administered Medications Ordered in Other Visits  Medication Dose Route Frequency Provider Last Rate Last Admin  . heparin lock flush 100 unit/mL  500 Units Intravenous Once [name], MD      . sodium chloride flush (NS) 0.9 % injection 10 mL  10 mL Intravenous PRN [name], MD   10 mL at 08/01/17 0829     PHYSICAL EXAMINATION: ECOG PERFORMANCE STATUS: 1 - Symptomatic but completely ambulatory Vitals:   08/25/20 1320  BP: 110/74  Pulse: 97  Resp: 18  Temp: 98.3 F (36.8 C)   Filed Weights   08/25/20 1320  Weight: 166 lb 8 oz (75.5 kg)    Physical Exam Constitutional:      General: She is not in acute distress.    Appearance: She is not diaphoretic.  HENT:     Head: Normocephalic and atraumatic.     Nose: Nose normal.     Mouth/Throat:     Pharynx: No oropharyngeal exudate.  Eyes:     General: No scleral icterus.    Pupils: Pupils are equal, round, and reactive to light.  Neck:     Vascular: No JVD.  Cardiovascular:     Rate and Rhythm: Normal rate and regular rhythm.     Heart sounds: Normal heart sounds. No murmur heard. No friction rub.  Pulmonary:     Effort: Pulmonary effort is normal. No respiratory distress.     Breath sounds: Normal breath sounds. No rales.  Chest:     Chest wall: No tenderness.  Abdominal:     General: Bowel sounds are normal. There is no distension.     Palpations: Abdomen is soft.     Tenderness: There is no abdominal tenderness.  Musculoskeletal:        General: No tenderness or deformity. Normal range of motion.     Cervical back: Normal range of motion and neck supple.  Lymphadenopathy:     Cervical: No cervical adenopathy.  Skin:    General: Skin is warm and dry.     Findings: No erythema.  Neurological:     Mental Status: She is alert and oriented to person, place, and time.     Cranial Nerves: No cranial nerve deficit.     Motor: No abnormal muscle tone.     Coordination: Coordination normal.   Psychiatric:        Mood and Affect: Affect normal.          Lab Results  Component Value Date   WBC 7.2 08/25/2020   HGB 13.1 08/25/2020   HCT 38.1 08/25/2020   MCV 87.6 08/25/2020  PLT 249 08/25/2020   Recent Labs    11/25/19 1020 02/25/20 1311 05/19/20 1354 08/25/20 1305  NA 138 138 138 137  K 4.5 4.2 4.0 4.0  CL 101 102 102 101  CO2 '29 26 29 27  ' GLUCOSE 98 108* 101* 104*  BUN '18 17 19 ' 22*  CREATININE 0.59 0.49 0.68 0.54  CALCIUM 9.5 8.8* 9.2 9.1  GFRNONAA >60 >60 >60 >60  GFRAA >60 >60  --   --   PROT 7.6 7.1 7.3 7.3  ALBUMIN 4.4 4.2 4.3 4.1  AST '18 17 18 19  ' ALT '14 13 14 13  ' ALKPHOS 50 39 50 49  BILITOT 0.7 0.4 0.5 0.5   RADIOGRAPHIC STUDIES: I have personally reviewed the radiological images as listed and agreed with the findings in the report. MM DIAG BREAST TOMO BILATERAL  Result Date: 06/26/2020 CLINICAL DATA:  Patient had a right lumpectomy for breast carcinoma 2018, treated with radiation and chemotherapy, and currently on Aromasin. No current breast complaints. EXAM: DIGITAL DIAGNOSTIC BILATERAL MAMMOGRAM WITH TOMO AND CAD COMPARISON:  Previous exam(s). ACR Breast Density Category b: There are scattered areas of fibroglandular density. FINDINGS: Post lumpectomy changes in the posterior upper outer right breast are stable. There are no masses or areas of nonsurgical architectural distortion. There are no suspicious calcifications. Mammographic images were processed with CAD. IMPRESSION: 1. No evidence of new or recurrent breast carcinoma. 2. Benign post lumpectomy changes on the right. RECOMMENDATION: Diagnostic mammogram in 1 year per standard post lumpectomy protocol. (Code:DM-B-01Y) I have discussed the findings and recommendations with the patient. If applicable, a reminder letter will be sent to the patient regarding the next appointment. BI-RADS CATEGORY  2: Benign. Electronically Signed   By: Lajean Manes M.D.   On: 06/26/2020 11:15    ASSESSMENT  & PLAN:  1. Malignant neoplasm of upper-outer quadrant of right breast in female, estrogen receptor positive ([place])   2. Aromatase inhibitor use   3. Suppression of ovarian secretion   4. Osteopenia, unspecified location   Cancer Staging Malignant neoplasm of upper-outer quadrant of right breast in female, estrogen receptor positive ([place]) Staging form: Breast, AJCC 8th Edition - Clinical stage from 07/15/2017: Stage IB (cT2, cN0, cM0, G2, ER: Positive, PR: Positive, HER2: Negative) - Signed by [name], MD on 07/15/2017 #ypT2 ypN0 (i+) (sn), grade 2 breast cancer, ER/PR positive, HER 2 negative. Labs reviewed and discussed with patient She is doing well clinically. Continue Aromasin She has been on monthly Zoladex. Mammogram in December 2021 was independent reviewed by me and discussed with patient.  No evidence of disease recurrence or new concerns.  Continue annual surveillance. CA 15.3, CA 27.29 are pending.  #Chronic aromatase inhibitor use, July 2020 DEXA showed osteopenia.  Continue calcium and vitamin D supplementation Last Zometa was 3 months ago.  We will proceed with Zometa at the next visit .  We spent sufficient time to discuss many aspect of care, questions were answered to patient's satisfaction. The patient knows to call the clinic with any problems questions or concerns.  Return visit: She will follow-up monthly for Zoladex injections, obtain CBC, CMP and follow-up in the clinic with Zoladex and Zometa in 3 months   [name], MD, PhD Hematology [place] [facility] Pager- [phone] 08/25/2020

## 2020-08-25 NOTE — Progress Notes (Signed)
Patient denies new problems/concerns today.   °

## 2020-08-26 ENCOUNTER — Encounter: Payer: Self-pay | Admitting: Oncology

## 2020-08-26 LAB — CANCER ANTIGEN 15-3: CA 15-3: 35.1 U/mL — ABNORMAL HIGH (ref 0.0–25.0)

## 2020-08-26 LAB — CANCER ANTIGEN 27.29: CA 27.29: 42.2 U/mL — ABNORMAL HIGH (ref 0.0–38.6)

## 2020-08-29 ENCOUNTER — Telehealth: Payer: Self-pay

## 2020-08-29 DIAGNOSIS — Z170 Estrogen receptor positive status [ER+]: Secondary | ICD-10-CM

## 2020-08-29 NOTE — Telephone Encounter (Signed)
Done.. CT has been sched as requested. A detailed message was left on pts VM making her aware Of the date, time, location must be NPO 4hrs and to pick up prep kit proir

## 2020-08-29 NOTE — Telephone Encounter (Signed)
Patient notified via [place].  Please schedule CT and inform patient of appt details.

## 2020-08-29 NOTE — Telephone Encounter (Signed)
-----   Message from [name], MD sent at 08/28/2020 11:07 PM EST ----- I saw her my chart message.  Please let her know that the low level of elevation is non specific, and we can follow up on that [if her CT is normal[.   I ecommend her to get CT chest abdomen pelvis w contrast to make sure. Please arrange

## 2020-09-07 ENCOUNTER — Telehealth: Payer: Self-pay | Admitting: *Deleted

## 2020-09-07 NOTE — Telephone Encounter (Signed)
Per Christina2/24/22  staff message: cx pts CT scan sched for 09/11/20 due to pending auth.  CT was cx as requested. A detailed message was left on pts VM making her aware that once it's approved it will be R/S and she would receive a call makig her aware of the location, date and time. Also to call the office or respond through  [place] with any questions or concerns.

## 2020-09-08 NOTE — Telephone Encounter (Signed)
Per Christina pts CT scan is now approved  CT scan has been Scheduled. 1st avail. date was 09/12/20 Called pt and made her aware of her scheduled 3/1 CT scan date, location, time NPO 4hrs prior and pick up prep

## 2020-09-11 ENCOUNTER — Ambulatory Visit: Payer: 59

## 2020-09-12 ENCOUNTER — Other Ambulatory Visit: Payer: Self-pay

## 2020-09-12 ENCOUNTER — Ambulatory Visit
Admission: RE | Admit: 2020-09-12 | Discharge: 2020-09-12 | Disposition: A | Discharge status: Home / Self Care | Payer: 59 | Source: Ambulatory Visit | Attending: Oncology | Admitting: Oncology

## 2020-09-12 DIAGNOSIS — C50411 Malignant neoplasm of upper-outer quadrant of right female breast: Secondary | ICD-10-CM | POA: Diagnosis not present

## 2020-09-12 DIAGNOSIS — Z170 Estrogen receptor positive status [ER+]: Secondary | ICD-10-CM | POA: Insufficient documentation

## 2020-09-12 MED ORDER — IOHEXOL 300 MG/ML  SOLN
100.0000 mL | Freq: Once | INTRAMUSCULAR | Status: AC | PRN
  Administered 2020-09-12: 100 mL via INTRAVENOUS

## 2020-09-22 ENCOUNTER — Inpatient Hospital Stay: Payer: 59 | Attending: Oncology

## 2020-09-22 DIAGNOSIS — M8580 Other specified disorders of bone density and structure, unspecified site: Secondary | ICD-10-CM | POA: Insufficient documentation

## 2020-09-22 DIAGNOSIS — C50411 Malignant neoplasm of upper-outer quadrant of right female breast: Secondary | ICD-10-CM | POA: Diagnosis present

## 2020-09-22 DIAGNOSIS — Z79899 Other long term (current) drug therapy: Secondary | ICD-10-CM | POA: Diagnosis not present

## 2020-09-22 DIAGNOSIS — Z803 Family history of malignant neoplasm of breast: Secondary | ICD-10-CM | POA: Insufficient documentation

## 2020-09-22 DIAGNOSIS — Z5111 Encounter for antineoplastic chemotherapy: Secondary | ICD-10-CM | POA: Insufficient documentation

## 2020-09-22 DIAGNOSIS — Z79811 Long term (current) use of aromatase inhibitors: Secondary | ICD-10-CM | POA: Diagnosis not present

## 2020-09-22 DIAGNOSIS — Z8043 Family history of malignant neoplasm of testis: Secondary | ICD-10-CM | POA: Diagnosis not present

## 2020-09-22 DIAGNOSIS — Z170 Estrogen receptor positive status [ER+]: Secondary | ICD-10-CM

## 2020-09-22 MED ORDER — GOSERELIN ACETATE 3.6 MG ~~LOC~~ IMPL
3.6000 mg | DRUG_IMPLANT | Freq: Once | SUBCUTANEOUS | Status: AC
  Administered 2020-09-22: 3.6 mg via SUBCUTANEOUS
  Filled 2020-09-22: qty 3.6

## 2020-10-20 ENCOUNTER — Inpatient Hospital Stay: Payer: 59 | Attending: Oncology

## 2020-10-20 DIAGNOSIS — Z5111 Encounter for antineoplastic chemotherapy: Secondary | ICD-10-CM | POA: Diagnosis present

## 2020-10-20 DIAGNOSIS — Z79811 Long term (current) use of aromatase inhibitors: Secondary | ICD-10-CM | POA: Diagnosis not present

## 2020-10-20 DIAGNOSIS — C50412 Malignant neoplasm of upper-outer quadrant of left female breast: Secondary | ICD-10-CM | POA: Insufficient documentation

## 2020-10-20 DIAGNOSIS — Z170 Estrogen receptor positive status [ER+]: Secondary | ICD-10-CM | POA: Insufficient documentation

## 2020-10-20 DIAGNOSIS — M8580 Other specified disorders of bone density and structure, unspecified site: Secondary | ICD-10-CM | POA: Insufficient documentation

## 2020-10-20 DIAGNOSIS — C50411 Malignant neoplasm of upper-outer quadrant of right female breast: Secondary | ICD-10-CM

## 2020-10-20 MED ORDER — GOSERELIN ACETATE 3.6 MG ~~LOC~~ IMPL
3.6000 mg | DRUG_IMPLANT | Freq: Once | SUBCUTANEOUS | Status: AC
  Administered 2020-10-20: 3.6 mg via SUBCUTANEOUS
  Filled 2020-10-20: qty 3.6

## 2020-11-22 ENCOUNTER — Encounter: Payer: Self-pay | Admitting: Oncology

## 2020-11-22 ENCOUNTER — Inpatient Hospital Stay: Payer: 59 | Attending: Oncology

## 2020-11-22 ENCOUNTER — Other Ambulatory Visit: Payer: Self-pay

## 2020-11-22 ENCOUNTER — Inpatient Hospital Stay (HOSPITAL_BASED_OUTPATIENT_CLINIC_OR_DEPARTMENT_OTHER): Payer: 59 | Admitting: Oncology

## 2020-11-22 ENCOUNTER — Inpatient Hospital Stay: Payer: 59

## 2020-11-22 VITALS — BP 117/77 | HR 90 | Temp 98.0°F | Resp 18 | Wt 166.0 lb

## 2020-11-22 DIAGNOSIS — E2839 Other primary ovarian failure: Secondary | ICD-10-CM | POA: Diagnosis not present

## 2020-11-22 DIAGNOSIS — Z170 Estrogen receptor positive status [ER+]: Secondary | ICD-10-CM

## 2020-11-22 DIAGNOSIS — M8580 Other specified disorders of bone density and structure, unspecified site: Secondary | ICD-10-CM | POA: Diagnosis not present

## 2020-11-22 DIAGNOSIS — C50411 Malignant neoplasm of upper-outer quadrant of right female breast: Secondary | ICD-10-CM

## 2020-11-22 DIAGNOSIS — Z79811 Long term (current) use of aromatase inhibitors: Secondary | ICD-10-CM | POA: Insufficient documentation

## 2020-11-22 LAB — COMPREHENSIVE METABOLIC PANEL
ALT: 17 U/L (ref 0–44)
AST: 23 U/L (ref 15–41)
Albumin: 4.2 g/dL (ref 3.5–5.0)
Alkaline Phosphatase: 56 U/L (ref 38–126)
Anion gap: 10 (ref 5–15)
BUN: 20 mg/dL (ref 6–20)
CO2: 26 mmol/L (ref 22–32)
Calcium: 9 mg/dL (ref 8.9–10.3)
Chloride: 100 mmol/L (ref 98–111)
Creatinine, Ser: 0.64 mg/dL (ref 0.44–1.00)
GFR, Estimated: >60 mL/min (ref >60)
Glucose, Bld: 101 mg/dL — ABNORMAL HIGH (ref 70–99)
Potassium: 4.1 mmol/L (ref 3.5–5.1)
Sodium: 136 mmol/L (ref 135–145)
Total Bilirubin: 0.4 mg/dL (ref 0.3–1.2)
Total Protein: 7.3 g/dL (ref 6.5–8.1)

## 2020-11-22 LAB — CBC WITH DIFFERENTIAL/PLATELET
Abs Immature Granulocytes: 0.01 10*3/uL (ref 0.00–0.07)
Basophils Absolute: 0 10*3/uL (ref 0.0–0.1)
Basophils Relative: 0 %
Eosinophils Absolute: 0.1 10*3/uL (ref 0.0–0.5)
Eosinophils Relative: 1 %
HCT: 39.5 % (ref 36.0–46.0)
Hemoglobin: 13.1 g/dL (ref 12.0–15.0)
Immature Granulocytes: 0 %
Lymphocytes Relative: 29 %
Lymphs Abs: 2 10*3/uL (ref 0.7–4.0)
MCH: 29.1 pg (ref 26.0–34.0)
MCHC: 33.2 g/dL (ref 30.0–36.0)
MCV: 87.8 fL (ref 80.0–100.0)
Monocytes Absolute: 0.4 10*3/uL (ref 0.1–1.0)
Monocytes Relative: 6 %
Neutro Abs: 4.4 10*3/uL (ref 1.7–7.7)
Neutrophils Relative %: 64 %
Platelets: 255 10*3/uL (ref 150–400)
RBC: 4.5 MIL/uL (ref 3.87–5.11)
RDW: 11.1 % — ABNORMAL LOW (ref 11.5–15.5)
WBC: 6.9 10*3/uL (ref 4.0–10.5)
nRBC: 0 % (ref 0.0–0.2)

## 2020-11-22 MED ORDER — SODIUM CHLORIDE 0.9 % IV SOLN
Freq: Once | INTRAVENOUS | Status: AC
  Filled 2020-11-22: qty 250

## 2020-11-22 MED ORDER — ZOLEDRONIC ACID 4 MG/100ML IV SOLN
4.0000 mg | Freq: Once | INTRAVENOUS | Status: AC
  Administered 2020-11-22: 4 mg via INTRAVENOUS
  Filled 2020-11-22: qty 100

## 2020-11-22 MED ORDER — GOSERELIN ACETATE 3.6 MG ~~LOC~~ IMPL
3.6000 mg | DRUG_IMPLANT | Freq: Once | SUBCUTANEOUS | Status: AC
  Administered 2020-11-22: 3.6 mg via SUBCUTANEOUS
  Filled 2020-11-22: qty 3.6

## 2020-11-22 NOTE — Progress Notes (Signed)
Hematology/Oncology Follow up note [facility] [phone] [phone]   Patient Care Team: [name], NP as PCP - General (Nurse Practitioner) Theodore Demark, RN as Oncology Nurse Navigator [name], [name], MD as Referring Physician (Surgery) [name], MD as Consulting Physician (Oncology) [name], MD as Consulting Physician (General Surgery) [name], MD as Referring Physician (Radiation Oncology)  REASON FOR VISIT Follow up for management of breast cancer   HISTORY OF PRESENTING ILLNESS:  Anne Wu is a  40 y.o.  female with diagnosis of cT2N0M0, grade 2 invasive mammary carcinoma.  She has significant family history on both maternal side as well as paternal side. She felt a right breast mass 2 months ago and diagnostic mammogram showed right upper quandrant 2.2 cm mass, which was confirmed on [place] and biopsied. Right axillary no clinically suspicious lymph node. Pathology showed invasive mammary carcinoma, ER/PR positive,, HER2 IHC  equivocal, FISH negative.   # Patient was evaluated by Dr.Smith. From surgical standpoint, Dr.Smith recommend neoadjuvant chemotherapy to facilitate surgery to obtain negative margin and better surgical outcome. Also given patient's young age, neoadjuvant chemotherapy is reasonable.    #Patient stated that she does not desire future fertility.  # 2D Echo showed normal systolic function, mild mitral regurgitation.  # CT was done  to rule out metastatic disease given micrometastasis. Images were independently reviewed by me and discussed with patient.  Negative for metastatic disease. # Testing did not reveal any pathogenic mutation in any of these genes. A copy of the genetic test report will be scanned into Epic under the Media tab.The genes analyzed were the 23 genes on [facility]'s Breast/GYN panel (ATM, BARD1, BRCA1, BRCA2, BRIP1, CDH1, CHEK2, DICER1, EPCAM, MLH1,  MSH2, MSH6,  NBN, NF1, PALB2, PMS2, PTEN, RAD50, RAD51C, RAD51D,SMARCA4, STK11, and TP53). 09/08/2017 Interval mammogram after 4 cycle of AC, showed good treatment response. The biopsy-proven malignancy in the upper outer quadrant of the right breast at posterior depth, associated with scattered microcalcifications and architectural distortion, has significantly decreased in size since the mammogram 06/20/2017. On today's mammogram, the mass measures approximately 1.7 x 2.2 x 1.2 cm (previously 2.4 x 1.6 x 2.7 cm).   Treatment Summary :  Jan 2019- May 2019 Neoadjuvant AC-->T  12/29/2017 Right lumpectomy and sentinel LN biopsy Pathology showed ypT2 ypN0 (i+) (sn) case was discussed on tumor board.ER/PR positive, HER 2 negative.  Aug 2019-September 2019 Adjuvant RT  Oct 2019 to start Zoladex and Aromasin. Mediport was removed.  INTERVAL HISTORY Anne Wu is a 40 y.o. female who has above history reviewed by me presents for follow up for breast cancer management  Patient takes Aromasin daily.  She has also been on ovarian suppression with monthly Zoladex. Patient reports feeling well.  Manageable side effects.  No new complaints.  Review of Systems  Constitutional: Negative for appetite change, chills, fatigue and fever.  HENT:   Negative for hearing loss and voice change.   Eyes: Negative for eye problems.  Respiratory: Negative for chest tightness and cough.   Cardiovascular: Negative for chest pain.  Gastrointestinal: Negative for abdominal distention, abdominal pain and blood in stool.  Endocrine: Positive for hot flashes.  Genitourinary: Negative for difficulty urinating and frequency.   Musculoskeletal: Positive for arthralgias.  Skin: Negative for itching and rash.  Neurological: Negative for dizziness, extremity weakness and light-headedness.  Hematological: Negative for adenopathy.  Psychiatric/Behavioral: Negative for confusion. The patient is not nervous/anxious.     MEDICAL HISTORY:  Past Medical History:  Diagnosis Date  . Asthma    AS A CHILD-NO INHALERS  . Breast cancer ([place]) 2018   Right breast cancer-[facility]  . Elevated blood pressure, situational    PT STATES HER LAST COUPLE OF MD APPOINTMENTS SHE HAS HAD ELEVATED BP-NEVER HAD A PROBLEM WITH THIS PREVIOUSLY  . Genetic testing 09/05/2017   Breast/GYN panel (23 genes) @ [facility] - No pathogenic mutations detected  . GERD (gastroesophageal reflux disease)    OCC- NO MEDS  . Malignant neoplasm of upper-outer quadrant of right breast in female, estrogen receptor positive ([place]) 2019   [facility] and DCIS  . Personal history of chemotherapy   . Personal history of radiation therapy     SURGICAL HISTORY: Past Surgical History:  Procedure Laterality Date  . BREAST BIOPSY Right 06/20/2017   Invasive Mammary Carcinoma  . BREAST EXCISIONAL BIOPSY Right 12/29/2017   RESIDUAL INVASIVE MAMMARY CARCINOMA and DCIS  . BREAST LUMPECTOMY Right 12/29/2017   needle localization and sentinel node injection  . NO PAST SURGERIES    . PARTIAL MASTECTOMY WITH NEEDLE LOCALIZATION Right 12/29/2017   Procedure: PARTIAL MASTECTOMY WITH NEEDLE LOCALIZATION;  Surgeon: [name], MD;  Location: [facility] ORS;  Service: General;  Laterality: Right;  . PORTACATH PLACEMENT N/A 07/11/2017   Procedure: INSERTION PORT-A-CATH;  Surgeon: Leonie Green, MD;  Location: [facility] ORS;  Service: General;  Laterality: N/A;  . SENTINEL NODE BIOPSY Right 12/29/2017   Procedure: SENTINEL NODE BIOPSY;  Surgeon: [name], MD;  Location: [facility] ORS;  Service: General;  Laterality: Right;    SOCIAL HISTORY: Social History   Socioeconomic History  . Marital status: Married    Spouse name: Not on file  . Number of children: Not on file  . Years of education: Not on file  . Highest education level: Not on file  Occupational History  . Not on file  Tobacco Use  . Smoking status: Never Smoker  . Smokeless tobacco: Never Used  Vaping Use  .  Vaping Use: Never used  Substance and Sexual Activity  . Alcohol use: No    Alcohol/week: 0.0 standard drinks  . Drug use: No  . Sexual activity: Yes  Other Topics Concern  . Not on file  Social History Narrative  . Not on file   Social Determinants of Health   Financial Resource Strain: Not on file  Food Insecurity: Not on file  Transportation Needs: Not on file  Physical Activity: Not on file  Stress: Not on file  Social Connections: Not on file  Intimate Partner Violence: Not on file    FAMILY HISTORY: Family History  Problem Relation Age of Onset  . Breast cancer Maternal Aunt 13       currently late 44s  . Breast cancer Maternal Grandmother 68       second breast vs. recurrence ca at 62; deceased at 40  . Colon cancer Maternal Uncle 59       deceased in 32s  . Breast cancer Paternal Aunt        age at dx unknown; currently in 62s  . Breast cancer Paternal Grandmother        age at dx unknown  . Melanoma Mother        on leg  . Testicular cancer Maternal Uncle 1       currently 68s    ALLERGIES:  is allergic to codeine.  MEDICATIONS:  Current Outpatient Medications  Medication Sig Dispense Refill  . acetaminophen (  TYLENOL) 325 MG tablet Take 650 mg by mouth every 6 (six) hours as needed for moderate pain or headache.     . Calcium Carbonate-Vit D-Min (CALCIUM 1200 PO) Take 1 tablet by mouth daily.    . cholecalciferol (VITAMIN D) 1000 units tablet Take 1,000 Units by mouth daily.    Marland Kitchen ELDERBERRY PO Take 1 tablet by mouth daily.    Marland Kitchen exemestane (AROMASIN) 25 MG tablet Take 1 tablet (25 mg total) by mouth daily after breakfast. 90 tablet 1  . lidocaine-prilocaine (EMLA) cream Apply 1 application topically as needed. 30 g 3  . Multiple Vitamin (MULTI-VITAMINS) TABS Take 1 tablet by mouth daily.     Marland Kitchen loratadine (CLARITIN) 10 MG tablet Take 10 mg by mouth daily as needed for allergies.  (Patient not taking: No sig reported)     No current facility-administered  medications for this visit.   Facility-Administered Medications Ordered in Other Visits  Medication Dose Route Frequency Provider Last Rate Last Admin  . heparin lock flush 100 unit/mL  500 Units Intravenous Once [name], MD      . sodium chloride flush (NS) 0.9 % injection 10 mL  10 mL Intravenous PRN [name], MD   10 mL at 08/01/17 0829     PHYSICAL EXAMINATION: ECOG PERFORMANCE STATUS: 1 - Symptomatic but completely ambulatory Vitals:   11/22/20 1326  BP: 117/77  Pulse: 90  Resp: 18  Temp: 98 F (36.7 C)  SpO2: 100%   Filed Weights   11/22/20 1326  Weight: 166 lb (75.3 kg)    Physical Exam Constitutional:      General: She is not in acute distress.    Appearance: She is not diaphoretic.  HENT:     Head: Normocephalic and atraumatic.     Nose: Nose normal.     Mouth/Throat:     Pharynx: No oropharyngeal exudate.  Eyes:     General: No scleral icterus.    Pupils: Pupils are equal, round, and reactive to light.  Neck:     Vascular: No JVD.  Cardiovascular:     Rate and Rhythm: Normal rate and regular rhythm.     Heart sounds: Normal heart sounds. No murmur heard. No friction rub.  Pulmonary:     Effort: Pulmonary effort is normal. No respiratory distress.     Breath sounds: Normal breath sounds. No rales.  Chest:     Chest wall: No tenderness.  Abdominal:     General: Bowel sounds are normal. There is no distension.     Palpations: Abdomen is soft.     Tenderness: There is no abdominal tenderness.  Musculoskeletal:        General: No tenderness or deformity. Normal range of motion.     Cervical back: Normal range of motion and neck supple.  Lymphadenopathy:     Cervical: No cervical adenopathy.  Skin:    General: Skin is warm and dry.     Findings: No erythema.  Neurological:     Mental Status: She is alert and oriented to person, place, and time.     Cranial Nerves: No cranial nerve deficit.     Motor: No abnormal muscle tone.     Coordination:  Coordination normal.  Psychiatric:        Mood and Affect: Affect normal.   Breast exam was performed in seated position. Patient is status post right breast lumpectomy with a well-healed surgical scar. No evidence of any right palpable masses. No palpable  right axillary adenopathy. No palpable masses or lumps in the left breast. No evidence of left axillary adenopathy  Lab Results  Component Value Date   WBC 6.9 11/22/2020   HGB 13.1 11/22/2020   HCT 39.5 11/22/2020   MCV 87.8 11/22/2020   PLT 255 11/22/2020   Recent Labs    11/25/19 1020 02/25/20 1311 05/19/20 1354 08/25/20 1305 11/22/20 1308  NA 138 138 138 137 136  K 4.5 4.2 4.0 4.0 4.1  CL 101 102 102 101 100  CO2 _0 GLUCOSE 98 108* 101* 104* 101*  BUN _1 22* 20  CREATININE 0.59 0.49 0.68 0.54 0.64  CALCIUM 9.5 8.8* 9.2 9.1 9.0  GFRNONAA >60 >60 >60 >60 >60  GFRAA >60 >60  --   --   --   PROT 7.6 7.1 7.3 7.3 7.3  ALBUMIN 4.4 4.2 4.3 4.1 4.2  AST _2 ALT _3 ALKPHOS 50 39 50 49 56  BILITOT 0.7 0.4 0.5 0.5 0.4   RADIOGRAPHIC STUDIES: I have personally reviewed the radiological images as listed and agreed with the findings in the report. CT CHEST ABDOMEN PELVIS W CONTRAST  Result Date: 09/12/2020 CLINICAL DATA:  follow up of right breast cancer. Hx of lumpectomy on the right breast . EXAM: CT CHEST, ABDOMEN, AND PELVIS WITH CONTRAST TECHNIQUE: Multidetector CT imaging of the chest, abdomen and pelvis was performed following the standard protocol during bolus administration of intravenous contrast. CONTRAST:  182m OMNIPAQUE IOHEXOL 300 MG/ML  SOLN COMPARISON:  CT chest abdomen and pelvis January 26, 2018 FINDINGS: CT CHEST FINDINGS Cardiovascular: No aneurysmal dilation of thoracic aorta. No central pulmonary embolus. Normal heart size. No pericardial effusion. Mediastinum/Nodes: Thyroid unremarkable. Trachea esophagus are unremarkable. No mediastinal, hilar or axillary adenopathy.  Lungs/Pleura: Lungs are clear. No pleural effusion or pneumothorax. Musculoskeletal: Surgical changes involving the right lateral breast wall. Thoracic spondylosis. No suspicious lytic or blastic lesion of bone. CT ABDOMEN PELVIS FINDINGS Hepatobiliary: No suspicious hepatic lesion. Gallbladder is unremarkable. No biliary ductal dilation. Pancreas: Unremarkable Spleen: Unremarkable Adrenals/Urinary Tract: Bilateral adrenal glands unremarkable. No hydronephrosis. Ectopic left kidney, similar prior. No suspicious renal lesions. No filling defect visualized within the opacified portions of the collecting systems and ureter on delayed imaging. Urinary bladder is unremarkable. Stomach/Bowel: Radiopaque ingested contrast visualized to the level of the rectum. Stomach is unremarkable. Normal positioning of the duodenum/ligament of [place]. No suspicious small bowel wall thickening or dilation. Terminal ileum and appendix appear unremarkable. Colonic diverticulosis without findings of acute diverticulitis. No suspicious colonic wall thickening or mass like lesions. Vascular/Lymphatic: No significant vascular findings are present. No enlarged abdominal or pelvic lymph nodes. Reproductive: Leiomyomatous uterus.  No suspicious adnexal lesions. Other: No abdominopelvic ascites. Musculoskeletal: Multilevel degenerative changes spine. No suspicious lytic or blastic lesions of bone. IMPRESSION: 1. No CT evidence of recurrence or metastatic disease within the chest, abdomen, or pelvis. 2. Colonic diverticulosis without findings of acute diverticulitis. 3. Leiomyomatous uterus. Electronically Signed   By: JDahlia BailiffMD   On: 09/12/2020 15:12    ASSESSMENT & PLAN:  1. Malignant neoplasm of upper-outer quadrant of right breast in female, estrogen receptor positive ([place]   2. Aromatase inhibitor use   3. Suppression of ovarian secretion   4. Osteopenia, unspecified location   Cancer Staging Malignant neoplasm of upper-outer  quadrant of right breast in female, estrogen receptor positive ([place] Staging form: Breast, AJCC 8th Edition -  Clinical stage from 07/15/2017: Stage IB (cT2, cN0, cM0, G2, ER: Positive, PR: Positive, HER2: Negative) - Signed by [name], MD on 07/15/2017 #ypT2 ypN0 (i+) (sn), grade 2 breast cancer, ER/PR positive, HER 2 negative. Labs reviewed and discussed with patient.  Continue Aromasin Proceed with monthly Zoladex. CA 15.3, CA 27.29 are are slightly elevated with no CT evidence of metastasis.  Discussed with patient. Mammogram is due in December 2022.  #Chronic aromatase inhibitor use, July 2020 DEXA showed osteopenia.  Recommend patient continue calcium and vitamin D supplementation.  I will obtain DEXA in July 2022 Proceed with Zometa today. .  We spent sufficient time to discuss many aspect of care, questions were answered to patient's satisfaction. The patient knows to call the clinic with any problems questions or concerns.  Return visit: She will follow-up monthly for Zoladex injections, obtain CBC, CMP and follow-up in the clinic with Zoladex and Zometa in 3 months   [name], MD, PhD Hematology [place] at [facility] Pager- [phone] 11/22/2020

## 2020-11-22 NOTE — Progress Notes (Signed)
Patient here for oncology follow-up appointment, expresses no complaints or concerns at this time.    

## 2020-11-22 NOTE — Patient Instructions (Addendum)
Goserelin injection What is this medicine? GOSERELIN (GOE se rel in) is similar to a hormone found in the body. It lowers the amount of sex hormones that the body makes. Men will have lower testosterone levels and women will have lower estrogen levels while taking this medicine. In men, this medicine is used to treat prostate cancer; the injection is either given once per month or once every 12 weeks. A once per month injection (only) is used to treat women with endometriosis, dysfunctional uterine bleeding, or advanced breast cancer. This medicine may be used for other purposes; ask your health care provider or pharmacist if you have questions. COMMON BRAND NAME(S): Zoladex What should I tell my health care provider before I take this medicine? They need to know if you have any of these conditions:  bone problems  diabetes  heart disease  history of irregular heartbeat  an unusual or allergic reaction to goserelin, other medicines, foods, dyes, or preservatives  pregnant or trying to get pregnant  breast-feeding How should I use this medicine? This medicine is for injection under the skin. It is given by a health care professional in a hospital or clinic setting. Talk to your pediatrician regarding the use of this medicine in children. Special care may be needed. Overdosage: If you think you have taken too much of this medicine contact a poison control center or emergency room at once. NOTE: This medicine is only for you. Do not share this medicine with others. What if I miss a dose? It is important not to miss your dose. Call your doctor or health care professional if you are unable to keep an appointment. What may interact with this medicine? Do not take this medicine with any of the following medications:  cisapride  dronedarone  pimozide  thioridazine This medicine may also interact with the following medications:  other medicines that prolong the QT interval (an abnormal  heart rhythm) This list may not describe all possible interactions. Give your health care provider a list of all the medicines, herbs, non-prescription drugs, or dietary supplements you use. Also tell them if you smoke, drink alcohol, or use illegal drugs. Some items may interact with your medicine. What should I watch for while using this medicine? Visit your doctor or health care provider for regular checks on your progress. Your symptoms may appear to get worse during the first weeks of this therapy. Tell your doctor or healthcare provider if your symptoms do not start to get better or if they get worse after this time. Your bones may get weaker if you take this medicine for a long time. If you smoke or frequently drink alcohol you may increase your risk of bone loss. A family history of osteoporosis, chronic use of drugs for seizures (convulsions), or corticosteroids can also increase your risk of bone loss. Talk to your doctor about how to keep your bones strong. This medicine should stop regular monthly menstruation in women. Tell your doctor if you continue to menstruate. Women should not become pregnant while taking this medicine or for 12 weeks after stopping this medicine. Women should inform their doctor if they wish to become pregnant or think they might be pregnant. There is a potential for serious side effects to an unborn child. Talk to your health care professional or pharmacist for more information. Do not breast-feed an infant while taking this medicine. Men should inform their doctors if they wish to father a child. This medicine may lower sperm counts. Talk   to your health care professional or pharmacist for more information. This medicine may increase blood sugar. Ask your healthcare provider if changes in diet or medicines are needed if you have diabetes. What side effects may I notice from receiving this medicine? Side effects that you should report to your doctor or health care  professional as soon as possible:  allergic reactions like skin rash, itching or hives, swelling of the face, lips, or tongue  bone pain  breathing problems  changes in vision  chest pain  feeling faint or lightheaded, falls  fever, chills  pain, swelling, warmth in the leg  pain, tingling, numbness in the hands or feet  signs and symptoms of high blood sugar such as being more thirsty or hungry or having to urinate more than normal. You may also feel very tired or have blurry vision  signs and symptoms of low blood pressure like dizziness; feeling faint or lightheaded, falls; unusually weak or tired  stomach pain  swelling of the ankles, feet, hands  trouble passing urine or change in the amount of urine  unusually high or low blood pressure  unusually weak or tired Side effects that usually do not require medical attention (report to your doctor or health care professional if they continue or are bothersome):  change in sex drive or performance  changes in breast size in both males and females  changes in emotions or moods  headache  hot flashes  irritation at site where injected  loss of appetite  skin problems like acne, dry skin  vaginal dryness This list may not describe all possible side effects. Call your doctor for medical advice about side effects. You may report side effects to FDA at 1-800-FDA-1088. Where should I keep my medicine? This drug is given in a hospital or clinic and will not be stored at home. NOTE: This sheet is a summary. It may not cover all possible information. If you have questions about this medicine, talk to your doctor, pharmacist, or health care provider.  2021 [employer] (2018-10-19 14:05:56) Zoledronic Acid Injection (Hypercalcemia, Oncology) What is this medicine? ZOLEDRONIC ACID (ZOE le dron ik AS id) slows calcium loss from bones. It high calcium levels in the blood from some kinds of cancer. It may be used  in other people at risk for bone loss. This medicine may be used for other purposes; ask your health care provider or pharmacist if you have questions. COMMON BRAND NAME(S): Zometa What should I tell my health care provider before I take this medicine? They need to know if you have any of these conditions:  cancer  dehydration  dental disease  kidney disease  liver disease  low levels of calcium in the blood  lung or breathing disease (asthma)  receiving steroids like dexamethasone or prednisone  an unusual or allergic reaction to zoledronic acid, other medicines, foods, dyes, or preservatives  pregnant or trying to get pregnant  breast-feeding How should I use this medicine? This drug is injected into a vein. It is given by a health care provider in a hospital or clinic setting. Talk to your health care provider about the use of this drug in children. Special care may be needed. Overdosage: If you think you have taken too much of this medicine contact a poison control center or emergency room at once. NOTE: This medicine is only for you. Do not share this medicine with others. What if I miss a dose? Keep appointments for follow-up doses. It is  important not to miss your dose. Call your health care provider if you are unable to keep an appointment. What may interact with this medicine?  certain antibiotics given by injection  NSAIDs, medicines for pain and inflammation, like ibuprofen or naproxen  some diuretics like bumetanide, furosemide  teriparatide  thalidomide This list may not describe all possible interactions. Give your health care provider a list of all the medicines, herbs, non-prescription drugs, or dietary supplements you use. Also tell them if you smoke, drink alcohol, or use illegal drugs. Some items may interact with your medicine. What should I watch for while using this medicine? Visit your health care provider for regular checks on your progress. It  may be some time before you see the benefit from this drug. Some people who take this drug have severe bone, joint, or muscle pain. This drug may also increase your risk for jaw problems or a broken thigh bone. Tell your health care provider right away if you have severe pain in your jaw, bones, joints, or muscles. Tell you health care provider if you have any pain that does not go away or that gets worse. Tell your dentist and dental surgeon that you are taking this drug. You should not have major dental surgery while on this drug. See your dentist to have a dental exam and fix any dental problems before starting this drug. Take good care of your teeth while on this drug. Make sure you see your dentist for regular follow-up appointments. You should make sure you get enough calcium and vitamin D while you are taking this drug. Discuss the foods you eat and the vitamins you take with your health care provider. Check with your health care provider if you have severe diarrhea, nausea, and vomiting, or if you sweat a lot. The loss of too much body fluid may make it dangerous for you to take this drug. You may need blood work done while you are taking this drug. Do not become pregnant while taking this drug. Women should inform their health care provider if they wish to become pregnant or think they might be pregnant. There is potential for serious harm to an unborn child. Talk to your health care provider for more information. What side effects may I notice from receiving this medicine? Side effects that you should report to your doctor or health care provider as soon as possible:  allergic reactions (skin rash, itching or hives; swelling of the face, lips, or tongue)  bone pain  infection (fever, chills, cough, sore throat, pain or trouble passing urine)  jaw pain, especially after dental work  joint pain  kidney injury (trouble passing urine or change in the amount of urine)  low blood pressure  (dizziness; feeling faint or lightheaded, falls; unusually weak or tired)  low calcium levels (fast heartbeat; muscle cramps or pain; pain, tingling, or numbness in the hands or feet; seizures)  low magnesium levels (fast, irregular heartbeat; muscle cramp or pain; muscle weakness; tremors; seizures)  low red blood cell counts (trouble breathing; feeling faint; lightheaded, falls; unusually weak or tired)  muscle pain  redness, blistering, peeling, or loosening of the skin, including inside the mouth  severe diarrhea  swelling of the ankles, feet, hands  trouble breathing Side effects that usually do not require medical attention (report to your doctor or health care provider if they continue or are bothersome):  anxious  constipation  coughing  depressed mood  eye irritation, itching, or pain  fever  general ill feeling or flu-like symptoms  nausea  pain, redness, or irritation at site where injected  trouble sleeping This list may not describe all possible side effects. Call your doctor for medical advice about side effects. You may report side effects to FDA at 1-800-FDA-1088. Where should I keep my medicine? This drug is given in a hospital or clinic. It will not be stored at home. NOTE: This sheet is a summary. It may not cover all possible information. If you have questions about this medicine, talk to your doctor, pharmacist, or health care provider.  2021 [employer] Standard (2019-04-15 09:13:00)

## 2020-12-25 ENCOUNTER — Inpatient Hospital Stay: Payer: 59 | Attending: Oncology

## 2020-12-25 DIAGNOSIS — C50411 Malignant neoplasm of upper-outer quadrant of right female breast: Secondary | ICD-10-CM | POA: Diagnosis present

## 2020-12-25 DIAGNOSIS — Z170 Estrogen receptor positive status [ER+]: Secondary | ICD-10-CM | POA: Diagnosis not present

## 2020-12-25 DIAGNOSIS — M8580 Other specified disorders of bone density and structure, unspecified site: Secondary | ICD-10-CM | POA: Diagnosis not present

## 2020-12-25 DIAGNOSIS — Z79811 Long term (current) use of aromatase inhibitors: Secondary | ICD-10-CM | POA: Insufficient documentation

## 2020-12-25 DIAGNOSIS — Z79899 Other long term (current) drug therapy: Secondary | ICD-10-CM | POA: Insufficient documentation

## 2020-12-25 MED ORDER — GOSERELIN ACETATE 3.6 MG ~~LOC~~ IMPL
3.6000 mg | DRUG_IMPLANT | Freq: Once | SUBCUTANEOUS | Status: AC
  Administered 2020-12-25: 16:00:00 3.6 mg via SUBCUTANEOUS
  Filled 2020-12-25: qty 3.6

## 2021-01-16 ENCOUNTER — Other Ambulatory Visit: Payer: Self-pay

## 2021-01-16 ENCOUNTER — Ambulatory Visit
Admission: RE | Admit: 2021-01-16 | Discharge: 2021-01-16 | Disposition: A | Discharge status: Home / Self Care | Payer: 59 | Source: Ambulatory Visit | Attending: Oncology | Admitting: Oncology

## 2021-01-16 DIAGNOSIS — M8580 Other specified disorders of bone density and structure, unspecified site: Secondary | ICD-10-CM | POA: Insufficient documentation

## 2021-01-16 DIAGNOSIS — C50411 Malignant neoplasm of upper-outer quadrant of right female breast: Secondary | ICD-10-CM | POA: Diagnosis not present

## 2021-01-16 DIAGNOSIS — Z170 Estrogen receptor positive status [ER+]: Secondary | ICD-10-CM | POA: Insufficient documentation

## 2021-01-22 ENCOUNTER — Inpatient Hospital Stay: Payer: 59 | Attending: Oncology

## 2021-01-22 ENCOUNTER — Other Ambulatory Visit: Payer: Self-pay

## 2021-01-22 DIAGNOSIS — M8580 Other specified disorders of bone density and structure, unspecified site: Secondary | ICD-10-CM | POA: Diagnosis not present

## 2021-01-22 DIAGNOSIS — Z170 Estrogen receptor positive status [ER+]: Secondary | ICD-10-CM | POA: Diagnosis not present

## 2021-01-22 DIAGNOSIS — C50411 Malignant neoplasm of upper-outer quadrant of right female breast: Secondary | ICD-10-CM | POA: Insufficient documentation

## 2021-01-22 DIAGNOSIS — Z79899 Other long term (current) drug therapy: Secondary | ICD-10-CM | POA: Diagnosis not present

## 2021-01-22 DIAGNOSIS — Z79811 Long term (current) use of aromatase inhibitors: Secondary | ICD-10-CM | POA: Diagnosis not present

## 2021-01-22 MED ORDER — GOSERELIN ACETATE 3.6 MG ~~LOC~~ IMPL
3.6000 mg | DRUG_IMPLANT | Freq: Once | SUBCUTANEOUS | Status: AC
  Administered 2021-01-22: 3.6 mg via SUBCUTANEOUS
  Filled 2021-01-22: qty 3.6

## 2021-02-26 ENCOUNTER — Other Ambulatory Visit: Payer: 59

## 2021-02-26 ENCOUNTER — Ambulatory Visit: Payer: 59

## 2021-02-26 ENCOUNTER — Ambulatory Visit: Payer: 59 | Admitting: Oncology

## 2021-02-27 ENCOUNTER — Inpatient Hospital Stay (HOSPITAL_BASED_OUTPATIENT_CLINIC_OR_DEPARTMENT_OTHER): Payer: 59 | Admitting: Oncology

## 2021-02-27 ENCOUNTER — Encounter: Payer: Self-pay | Admitting: Oncology

## 2021-02-27 ENCOUNTER — Inpatient Hospital Stay: Payer: 59

## 2021-02-27 ENCOUNTER — Inpatient Hospital Stay: Payer: 59 | Attending: Oncology

## 2021-02-27 VITALS — BP 109/76 | HR 86 | Temp 99.0°F | Resp 16 | Ht 64.0 in | Wt 163.9 lb

## 2021-02-27 DIAGNOSIS — Z923 Personal history of irradiation: Secondary | ICD-10-CM | POA: Diagnosis not present

## 2021-02-27 DIAGNOSIS — C50411 Malignant neoplasm of upper-outer quadrant of right female breast: Secondary | ICD-10-CM | POA: Insufficient documentation

## 2021-02-27 DIAGNOSIS — M8580 Other specified disorders of bone density and structure, unspecified site: Secondary | ICD-10-CM

## 2021-02-27 DIAGNOSIS — Z9221 Personal history of antineoplastic chemotherapy: Secondary | ICD-10-CM | POA: Diagnosis not present

## 2021-02-27 DIAGNOSIS — Z170 Estrogen receptor positive status [ER+]: Secondary | ICD-10-CM | POA: Insufficient documentation

## 2021-02-27 DIAGNOSIS — M85851 Other specified disorders of bone density and structure, right thigh: Secondary | ICD-10-CM | POA: Insufficient documentation

## 2021-02-27 DIAGNOSIS — Z79899 Other long term (current) drug therapy: Secondary | ICD-10-CM | POA: Diagnosis not present

## 2021-02-27 DIAGNOSIS — E2839 Other primary ovarian failure: Secondary | ICD-10-CM

## 2021-02-27 DIAGNOSIS — Z79811 Long term (current) use of aromatase inhibitors: Secondary | ICD-10-CM

## 2021-02-27 LAB — CBC WITH DIFFERENTIAL/PLATELET
Abs Immature Granulocytes: 0.02 10*3/uL (ref 0.00–0.07)
Basophils Absolute: 0.1 10*3/uL (ref 0.0–0.1)
Basophils Relative: 1 %
Eosinophils Absolute: 0.1 10*3/uL (ref 0.0–0.5)
Eosinophils Relative: 1 %
HCT: 38.7 % (ref 36.0–46.0)
Hemoglobin: 12.8 g/dL (ref 12.0–15.0)
Immature Granulocytes: 0 %
Lymphocytes Relative: 23 %
Lymphs Abs: 1.9 10*3/uL (ref 0.7–4.0)
MCH: 29.6 pg (ref 26.0–34.0)
MCHC: 33.1 g/dL (ref 30.0–36.0)
MCV: 89.4 fL (ref 80.0–100.0)
Monocytes Absolute: 0.5 10*3/uL (ref 0.1–1.0)
Monocytes Relative: 6 %
Neutro Abs: 5.8 10*3/uL (ref 1.7–7.7)
Neutrophils Relative %: 69 %
Platelets: 250 10*3/uL (ref 150–400)
RBC: 4.33 MIL/uL (ref 3.87–5.11)
RDW: 11.6 % (ref 11.5–15.5)
WBC: 8.3 10*3/uL (ref 4.0–10.5)
nRBC: 0 % (ref 0.0–0.2)

## 2021-02-27 LAB — COMPREHENSIVE METABOLIC PANEL
ALT: 13 U/L (ref 0–44)
AST: 18 U/L (ref 15–41)
Albumin: 4.4 g/dL (ref 3.5–5.0)
Alkaline Phosphatase: 44 U/L (ref 38–126)
Anion gap: 7 (ref 5–15)
BUN: 15 mg/dL (ref 6–20)
CO2: 30 mmol/L (ref 22–32)
Calcium: 9.4 mg/dL (ref 8.9–10.3)
Chloride: 100 mmol/L (ref 98–111)
Creatinine, Ser: 0.66 mg/dL (ref 0.44–1.00)
GFR, Estimated: >60 mL/min (ref >60)
Glucose, Bld: 91 mg/dL (ref 70–99)
Potassium: 4.2 mmol/L (ref 3.5–5.1)
Sodium: 137 mmol/L (ref 135–145)
Total Bilirubin: 0.6 mg/dL (ref 0.3–1.2)
Total Protein: 7.2 g/dL (ref 6.5–8.1)

## 2021-02-27 MED ORDER — EXEMESTANE 25 MG PO TABS: 25.0000 mg | ORAL_TABLET | Freq: Every day | ORAL | 1 refills | Status: DC

## 2021-02-27 MED ORDER — GOSERELIN ACETATE 3.6 MG ~~LOC~~ IMPL
3.6000 mg | DRUG_IMPLANT | Freq: Once | SUBCUTANEOUS | Status: AC
  Administered 2021-02-27: 3.6 mg via SUBCUTANEOUS
  Filled 2021-02-27: qty 3.6

## 2021-02-28 ENCOUNTER — Encounter: Payer: Self-pay | Admitting: Oncology

## 2021-02-28 NOTE — Progress Notes (Signed)
Hematology/Oncology Follow up note [facility] Telephone:([phone] Fax:(336) [phone]   Patient Care Team: [name], NP as PCP - General (Nurse Practitioner) Theodore Demark, RN as Oncology Nurse Navigator [name], [name], MD as Referring Physician (Surgery) [name], MD as Consulting Physician (Oncology) [name], MD as Consulting Physician (General Surgery) [name], MD as Referring Physician (Radiation Oncology)  REASON FOR VISIT Follow up for management of breast cancer   HISTORY OF PRESENTING ILLNESS:  Anne Wu is a  40 y.o.  female with diagnosis of cT2N0M0, grade 2 invasive mammary carcinoma.  She has significant family history on both maternal side as well as paternal side. She felt a right breast mass 2 months ago and diagnostic mammogram showed right upper quandrant 2.2 cm mass, which was confirmed on [place] and biopsied. Right axillary no clinically suspicious lymph node. Pathology showed invasive mammary carcinoma, ER/PR positive,, HER2 IHC  equivocal, FISH negative.   # Patient was evaluated by Dr.Smith. From surgical standpoint, Dr.Smith recommend neoadjuvant chemotherapy to facilitate surgery to obtain negative margin and better surgical outcome. Also given patient's young age, neoadjuvant chemotherapy is reasonable.    #Patient stated that she does not desire future fertility.  # 2D Echo showed normal systolic function, mild mitral regurgitation.  # CT was done  to rule out metastatic disease given micrometastasis. Images were independently reviewed by me and discussed with patient.  Negative for metastatic disease. # Testing did not reveal any pathogenic mutation in any of these genes.  A copy of the genetic test report will be scanned into Epic under the Media tab.The genes analyzed were the 23 genes on [facility]'s Breast/GYN panel (ATM, BARD1, BRCA1, BRCA2, BRIP1, CDH1, CHEK2, DICER1, EPCAM, MLH1,  MSH2, MSH6,  NBN, NF1, PALB2, PMS2, PTEN, RAD50, RAD51C, RAD51D,SMARCA4, STK11, and TP53). 09/08/2017 Interval mammogram after 4 cycle of AC, showed good treatment response. The biopsy-proven malignancy in the upper outer quadrant of the right breast at posterior depth, associated with scattered microcalcifications and architectural distortion, has significantly decreased in size since the mammogram 06/20/2017. On today's mammogram, the mass measures approximately 1.7 x 2.2 x 1.2 cm (previously 2.4 x 1.6 x 2.7 cm).   Treatment Summary :  Jan 2019- May 2019 Neoadjuvant AC-->T  12/29/2017 Right lumpectomy and sentinel LN biopsy Pathology showed ypT2 ypN0 (i+) (sn) case was discussed on tumor board.ER/PR positive, HER 2 negative.  Aug 2019-September 2019 Adjuvant RT  Oct 2019 to start Zoladex and Aromasin. Mediport was removed.  INTERVAL HISTORY Anne Wu is a 40 y.o. female who has above history reviewed by me presents for follow up for breast cancer management  Patient is on ovarian suppression with Zoladex monthly.  She also takes Aromasin daily.  Tolerates well.  Manageable side effects.  No new breast concerns  Review of Systems  Constitutional:  Negative for appetite change, chills, fatigue and fever.  HENT:   Negative for hearing loss and voice change.   Eyes:  Negative for eye problems.  Respiratory:  Negative for chest tightness and cough.   Cardiovascular:  Negative for chest pain.  Gastrointestinal:  Negative for abdominal distention, abdominal pain and blood in stool.  Endocrine: Positive for hot flashes.  Genitourinary:  Negative for difficulty urinating and frequency.   Musculoskeletal:  Positive for arthralgias.  Skin:  Negative for itching and rash.  Neurological:  Negative for dizziness, extremity weakness and light-headedness.  Hematological:  Negative for adenopathy.  Psychiatric/Behavioral:  Negative for confusion. The patient  is not nervous/anxious.    MEDICAL HISTORY:  Past  Medical History:  Diagnosis Date   Asthma    AS A CHILD-NO INHALERS   Breast cancer ([place]) 2018   Right breast cancer-[facility]   Elevated blood pressure, situational    PT STATES HER LAST COUPLE OF MD APPOINTMENTS SHE HAS HAD ELEVATED BP-NEVER HAD A PROBLEM WITH THIS PREVIOUSLY   Genetic testing 09/05/2017   Breast/GYN panel (23 genes) @ [facility] - No pathogenic mutations detected   GERD (gastroesophageal reflux disease)    OCC- NO MEDS   Malignant neoplasm of upper-outer quadrant of right breast in female, estrogen receptor positive ([place]) 2019   [facility] and [facility]   Personal history of chemotherapy    Personal history of radiation therapy     SURGICAL HISTORY: Past Surgical History:  Procedure Laterality Date   BREAST BIOPSY Right 06/20/2017   Invasive Mammary Carcinoma   BREAST EXCISIONAL BIOPSY Right 12/29/2017   RESIDUAL INVASIVE MAMMARY CARCINOMA and DCIS   BREAST LUMPECTOMY Right 12/29/2017   needle localization and sentinel node injection   NO PAST SURGERIES     PARTIAL MASTECTOMY WITH NEEDLE LOCALIZATION Right 12/29/2017   Procedure: PARTIAL MASTECTOMY WITH NEEDLE LOCALIZATION;  Surgeon: [name], MD;  Location: [facility] ORS;  Service: General;  Laterality: Right;   PORTACATH PLACEMENT N/A 07/11/2017   Procedure: INSERTION PORT-A-CATH;  Surgeon: Leonie Green, MD;  Location: [facility] ORS;  Service: General;  Laterality: N/A;   SENTINEL NODE BIOPSY Right 12/29/2017   Procedure: SENTINEL NODE BIOPSY;  Surgeon: [name], MD;  Location: [facility] ORS;  Service: General;  Laterality: Right;    SOCIAL HISTORY: Social History   Socioeconomic History   Marital status: Married    Spouse name: Not on file   Number of children: Not on file   Years of education: Not on file   Highest education level: Not on file  Occupational History   Not on file  Tobacco Use   Smoking status: Never   Smokeless tobacco: Never  Vaping Use   Vaping Use: Never used  Substance and  Sexual Activity   Alcohol use: No    Alcohol/week: 0.0 standard drinks   Drug use: No   Sexual activity: Yes  Other Topics Concern   Not on file  Social History Narrative   Not on file   Social Determinants of Health   Financial Resource Strain: Not on file  Food Insecurity: Not on file  Transportation Needs: Not on file  Physical Activity: Not on file  Stress: Not on file  Social Connections: Not on file  Intimate Partner Violence: Not on file    FAMILY HISTORY: Family History  Problem Relation Age of Onset   Breast cancer Maternal Aunt 61       currently late 60s   Breast cancer Maternal Grandmother 68       second breast vs. recurrence ca at 67; deceased at 29   Colon cancer Maternal Uncle 76       deceased in 43s   Breast cancer Paternal Aunt        age at dx unknown; currently in 79s   Breast cancer Paternal Grandmother        age at dx unknown   Melanoma Mother        on leg   Testicular cancer Maternal Uncle 34       currently 47s    ALLERGIES:  is allergic to codeine.  MEDICATIONS:  Current Outpatient Medications  Medication Sig Dispense Refill   acetaminophen (TYLENOL) 325 MG tablet Take 650 mg by mouth every 6 (six) hours as needed for moderate pain or headache.      Calcium Carbonate-Vit D-Min (CALCIUM 1200 PO) Take 1 tablet by mouth daily.     cholecalciferol (VITAMIN D) 1000 units tablet Take 1,000 Units by mouth daily.     lidocaine-prilocaine (EMLA) cream Apply 1 application topically as needed. 30 g 3   Multiple Vitamin (MULTI-VITAMINS) TABS Take 1 tablet by mouth daily.      ELDERBERRY PO Take 1 tablet by mouth daily. (Patient not taking: Reported on 02/27/2021)     exemestane (AROMASIN) 25 MG tablet Take 1 tablet (25 mg total) by mouth daily after breakfast. 90 tablet 1   loratadine (CLARITIN) 10 MG tablet Take 10 mg by mouth daily as needed for allergies.  (Patient not taking: No sig reported)     No current facility-administered medications for  this visit.   Facility-Administered Medications Ordered in Other Visits  Medication Dose Route Frequency Provider Last Rate Last Admin   heparin lock flush 100 unit/mL  500 Units Intravenous Once [name], MD       sodium chloride flush (NS) 0.9 % injection 10 mL  10 mL Intravenous PRN [name], MD   10 mL at 08/01/17 0829     PHYSICAL EXAMINATION: ECOG PERFORMANCE STATUS: 1 - Symptomatic but completely ambulatory Vitals:   02/27/21 1454  BP: 109/76  Pulse: 86  Resp: 16  Temp: 99 F (37.2 C)  SpO2: 100%   Filed Weights   02/27/21 1454  Weight: 163 lb 14.4 oz (74.3 kg)    Physical Exam Constitutional:      General: She is not in acute distress.    Appearance: She is not diaphoretic.  HENT:     Head: Normocephalic and atraumatic.     Nose: Nose normal.     Mouth/Throat:     Pharynx: No oropharyngeal exudate.  Eyes:     General: No scleral icterus.    Pupils: Pupils are equal, round, and reactive to light.  Neck:     Vascular: No JVD.  Cardiovascular:     Rate and Rhythm: Normal rate and regular rhythm.     Heart sounds: Normal heart sounds. No murmur heard.   No friction rub.  Pulmonary:     Effort: Pulmonary effort is normal. No respiratory distress.     Breath sounds: Normal breath sounds. No rales.  Chest:     Chest wall: No tenderness.  Abdominal:     General: Bowel sounds are normal. There is no distension.     Palpations: Abdomen is soft.     Tenderness: There is no abdominal tenderness.  Musculoskeletal:        General: No tenderness or deformity. Normal range of motion.     Cervical back: Normal range of motion and neck supple.  Lymphadenopathy:     Cervical: No cervical adenopathy.  Skin:    General: Skin is warm and dry.     Findings: No erythema.  Neurological:     Mental Status: She is alert and oriented to person, place, and time.     Cranial Nerves: No cranial nerve deficit.     Motor: No abnormal muscle tone.     Coordination: Coordination  normal.  Psychiatric:        Mood and Affect: Affect normal.  Breast exam was performed in seated position. Patient is status post right  breast lumpectomy with a well-healed surgical scar. No evidence of any right palpable masses. No palpable right axillary adenopathy. No palpable masses or lumps in the left breast. No evidence of left axillary adenopathy  Lab Results  Component Value Date   WBC 8.3 02/27/2021   HGB 12.8 02/27/2021   HCT 38.7 02/27/2021   MCV 89.4 02/27/2021   PLT 250 02/27/2021   Recent Labs    08/25/20 1305 11/22/20 1308 02/27/21 1422  NA 137 136 137  K 4.0 4.1 4.2  CL 101 100 100  CO2 '27 26 30  ' GLUCOSE 104* 101* 91  BUN 22* 20 15  CREATININE 0.54 0.64 0.66  CALCIUM 9.1 9.0 9.4  GFRNONAA >60 >60 >60  PROT 7.3 7.3 7.2  ALBUMIN 4.1 4.2 4.4  AST '19 23 18  ' ALT '13 17 13  ' ALKPHOS 49 56 44  BILITOT 0.5 0.4 0.6    RADIOGRAPHIC STUDIES: I have personally reviewed the radiological images as listed and agreed with the findings in the report. DG Bone Density  Result Date: 01/16/2021 EXAM: DUAL X-RAY ABSORPTIOMETRY (DXA) FOR BONE MINERAL DENSITY IMPRESSION: Your patient Tacara Hadlock completed a BMD test on 01/16/2021 using the [place] (software version: 14.10) manufactured by [employer]. The following summarizes the results of our evaluation. Technologist: [facility] PATIENT BIOGRAPHICAL: Name: Katey, Barrie Patient ID: [id number] Birth Date: 21-Nov-1980 Height: 64.0 in. Gender: Female Exam Date: 01/16/2021 Weight: 166.0 lbs. Indications: Caucasian, History of Breast Cancer, History of Chemo, History of Radiation, Postmenopausal Fractures: Treatments: Aromasin, Calcium, Vitamin D, Zometa DENSITOMETRY RESULTS: Site         Region     Measured Date Measured Age WHO Classification Young Adult T-score BMD         %Change vs. Previous Significant Change (*) DualFemur Neck Right 01/16/2021 41.8 Osteopenia -1.2 0.865 g/cm2 0.2% - DualFemur Neck Right 01/21/2019  39.8 Osteopenia -1.3 0.863 g/cm2 - - DualFemur Total Mean 01/16/2021 41.8 Normal -0.5 0.944 g/cm2 1.6% - DualFemur Total Mean 01/21/2019 39.8 Normal -0.6 0.929 g/cm2 - - Left Forearm Radius 33% 01/16/2021 41.8 Normal -0.8 0.809 g/cm2 -4.7% Yes Left Forearm Radius 33% 01/21/2019 39.8 Normal -0.3 0.849 g/cm2 - - ASSESSMENT: The BMD measured at Femur Neck Right is 0.865 g/cm2 with a T-score of -1.2. This patient is considered osteopenic according to [facility]) criteria. The scan quality is good. Lumbar spine was not utilized due to advanced degenerative changes. Compared with prior study, there has been no significant change in the total hip. Patient is not a candidate for FRAX due to Zometa. [facility]) criteria for post-menopausal, Caucasian Women: Normal:                   T-score at or above -1 SD Osteopenia/low bone mass: T-score between -1 and -2.5 SD Osteoporosis:             T-score at or below -2.5 SD RECOMMENDATIONS: 1. All patients should optimize calcium and vitamin D intake. 2. Consider FDA-approved medical therapies in postmenopausal women and men aged 9 years and older, based on the following: a. A hip or vertebral(clinical or morphometric) fracture b. T-score < -2.5 at the femoral neck or spine after appropriate evaluation to exclude secondary causes c. Low bone mass (T-score between -1.0 and -2.5 at the femoral neck or spine) and a 10-year probability of a hip fracture > 3% or a 10-year probability of a major osteoporosis-related fracture > 20% based  on the [place]-adapted WHO algorithm 3. Clinician judgment and/or patient preferences may indicate treatment for people with 10-year fracture probabilities above or below these levels FOLLOW-UP: People with diagnosed cases of osteoporosis or at high risk for fracture should have regular bone mineral density tests. For patients eligible for Medicare, routine testing is allowed once every 2 years. The testing frequency  can be increased to one year for patients who have rapidly progressing disease, those who are receiving or discontinuing medical therapy to restore bone mass, or have additional risk factors. I have reviewed this report, and agree with the above findings. Mark A. Thornton Papas, M.D. [facility] Electronically Signed   By: Lavonia Dana M.D.   On: 01/16/2021 16:36     ASSESSMENT & PLAN:  1. Malignant neoplasm of upper-outer quadrant of right breast in female, estrogen receptor positive ([place])   2. Aromatase inhibitor use   3. Suppression of ovarian secretion   4. Osteopenia, unspecified location   Cancer Staging Malignant neoplasm of upper-outer quadrant of right breast in female, estrogen receptor positive ([place]) Staging form: Breast, AJCC 8th Edition - Clinical stage from 07/15/2017: Stage IB (cT2, cN0, cM0, G2, ER+, PR+, HER2-) - Signed by [name], MD on 07/15/2017 #ypT2 ypN0 (i+) (sn), grade 2 breast cancer, ER/PR positive, HER 2 negative. Labs are reviewed and discussed with patient Proceed with monthly Zoladex. Continue Aromasin. CA 15.3, CA 27.29 are are slightly elevated with no CT evidence of metastasis- 09/12/20 .  I will repeat her levels in the next visit. Mammogram is due in December 2022.  Will obtain.  #Chronic aromatase inhibitor use,  01/16/2021, DEXA showed osteopenia, right femoral neck T- 1.2, stable Proceed with Zometa today-every 6 months .  We spent sufficient time to discuss many aspect of care, questions were answered to patient's satisfaction. The patient knows to call the clinic with any problems questions or concerns.  Return visit: She will follow-up monthly for Zoladex injections Will check if she will qualify for long-acting Zoladex every 3 months. Follow-up lab MD Zoladex and Zometa in 3 months    [name], MD, PhD Hematology Oncology [place] at [facility]  02/28/2021

## 2021-03-01 ENCOUNTER — Encounter: Payer: Self-pay | Admitting: Oncology

## 2021-03-01 NOTE — Addendum Note (Signed)
Addended by: [name] on: 03/01/2021 08:19 AM   Modules accepted: Orders

## 2021-03-27 ENCOUNTER — Inpatient Hospital Stay: Payer: 59

## 2021-03-28 ENCOUNTER — Inpatient Hospital Stay: Payer: 59 | Attending: Oncology

## 2021-03-28 DIAGNOSIS — Z79811 Long term (current) use of aromatase inhibitors: Secondary | ICD-10-CM | POA: Diagnosis not present

## 2021-03-28 DIAGNOSIS — C50411 Malignant neoplasm of upper-outer quadrant of right female breast: Secondary | ICD-10-CM | POA: Insufficient documentation

## 2021-03-28 DIAGNOSIS — Z5111 Encounter for antineoplastic chemotherapy: Secondary | ICD-10-CM | POA: Insufficient documentation

## 2021-03-28 DIAGNOSIS — M2550 Pain in unspecified joint: Secondary | ICD-10-CM | POA: Insufficient documentation

## 2021-03-28 DIAGNOSIS — Z170 Estrogen receptor positive status [ER+]: Secondary | ICD-10-CM | POA: Diagnosis not present

## 2021-03-28 DIAGNOSIS — R232 Flushing: Secondary | ICD-10-CM | POA: Insufficient documentation

## 2021-03-28 DIAGNOSIS — Z79899 Other long term (current) drug therapy: Secondary | ICD-10-CM | POA: Diagnosis not present

## 2021-03-28 DIAGNOSIS — M8580 Other specified disorders of bone density and structure, unspecified site: Secondary | ICD-10-CM | POA: Diagnosis not present

## 2021-03-28 MED ORDER — GOSERELIN ACETATE 3.6 MG ~~LOC~~ IMPL
3.6000 mg | DRUG_IMPLANT | Freq: Once | SUBCUTANEOUS | Status: AC
  Administered 2021-03-28: 3.6 mg via SUBCUTANEOUS
  Filled 2021-03-28: qty 3.6

## 2021-04-23 ENCOUNTER — Other Ambulatory Visit: Payer: Self-pay | Admitting: General Surgery

## 2021-04-23 DIAGNOSIS — Z853 Personal history of malignant neoplasm of breast: Secondary | ICD-10-CM

## 2021-04-23 DIAGNOSIS — Z1231 Encounter for screening mammogram for malignant neoplasm of breast: Secondary | ICD-10-CM

## 2021-04-24 ENCOUNTER — Inpatient Hospital Stay: Payer: 59

## 2021-04-25 ENCOUNTER — Encounter: Payer: Self-pay | Admitting: Radiation Oncology

## 2021-04-25 ENCOUNTER — Inpatient Hospital Stay: Payer: 59 | Attending: Oncology

## 2021-04-25 ENCOUNTER — Other Ambulatory Visit: Payer: Self-pay

## 2021-04-25 ENCOUNTER — Ambulatory Visit
Admission: RE | Admit: 2021-04-25 | Discharge: 2021-04-25 | Disposition: A | Discharge status: Home / Self Care | Payer: 59 | Source: Ambulatory Visit | Attending: Radiation Oncology | Admitting: Radiation Oncology

## 2021-04-25 VITALS — BP 119/73 | HR 90 | Temp 97.4°F | Resp 16 | Wt 162.2 lb

## 2021-04-25 DIAGNOSIS — Z170 Estrogen receptor positive status [ER+]: Secondary | ICD-10-CM | POA: Insufficient documentation

## 2021-04-25 DIAGNOSIS — Z923 Personal history of irradiation: Secondary | ICD-10-CM | POA: Insufficient documentation

## 2021-04-25 DIAGNOSIS — C50411 Malignant neoplasm of upper-outer quadrant of right female breast: Secondary | ICD-10-CM

## 2021-04-25 DIAGNOSIS — Z79811 Long term (current) use of aromatase inhibitors: Secondary | ICD-10-CM | POA: Insufficient documentation

## 2021-04-25 MED ORDER — GOSERELIN ACETATE 3.6 MG ~~LOC~~ IMPL
3.6000 mg | DRUG_IMPLANT | Freq: Once | SUBCUTANEOUS | Status: AC
  Administered 2021-04-25: 3.6 mg via SUBCUTANEOUS
  Filled 2021-04-25: qty 3.6

## 2021-04-25 NOTE — Progress Notes (Signed)
Radiation Oncology Follow up Note  Name: Anne Wu   Date:   04/25/2021 MRN:  [id number] DOB: 01/29/1981    This 40 y.o. female presents to the clinic today for 3-year follow-up status post whole breast radiation to her right breast for a T2N0 ER/PR positive invasive mammary carcinoma.  REFERRING PROVIDER: [name], NP  HPI: Patient is a 40 year old female now out 3 years having pleated whole breast radiation to her right breast for a T2N0 ER/PR positive invasive mammary carcinoma.  Seen today in routine follow-up she is doing well.  She specifically denies breast tenderness cough or bone pain..  She had mammograms back in December which I have reviewed were BI-RADS 2 benign.  She is currently on Aromasin tolerating that well without side effect.  COMPLICATIONS OF TREATMENT: none  FOLLOW UP COMPLIANCE: keeps appointments   PHYSICAL EXAM:  BP 119/73 (BP Location: Left Arm, Patient Position: Sitting)   Pulse 90   Temp (!) 97.4 F (36.3 C) (Tympanic)   Resp 16   Wt 162 lb 3.2 oz (73.6 kg)   BMI 27.84 kg/m  Lungs are clear to A&P cardiac examination essentially unremarkable with regular rate and rhythm. No dominant mass or nodularity is noted in either breast in 2 positions examined. Incision is well-healed. No axillary or supraclavicular adenopathy is appreciated. Cosmetic result is excellent.  Well-developed well-nourished patient in NAD. HEENT reveals PERLA, EOMI, discs not visualized.  Oral cavity is clear. No oral mucosal lesions are identified. Neck is clear without evidence of cervical or supraclavicular adenopathy. Lungs are clear to A&P. Cardiac examination is essentially unremarkable with regular rate and rhythm without murmur rub or thrill. Abdomen is benign with no organomegaly or masses noted. Motor sensory and DTR levels are equal and symmetric in the upper and lower extremities. Cranial nerves II through XII are grossly intact. Proprioception is intact. No peripheral  adenopathy or edema is identified. No motor or sensory levels are noted. Crude visual fields are within normal range.  RADIOLOGY RESULTS: Mammograms reviewed compatible with above-stated findings  PLAN: Present time patient continues to do well 3 years out with no evidence of disease.  And pleased with her overall progress.  I have asked to see her back in 1 year for follow-up.  I will discontinue follow-up care at that time.  Patient knows to call with any concerns.  She continues on Aromasin without side effect.  I would like to take this opportunity to thank you for allowing me to participate in the care of your patient.[name], MD

## 2021-04-26 ENCOUNTER — Ambulatory Visit: Payer: 59 | Admitting: Radiation Oncology

## 2021-05-18 ENCOUNTER — Encounter: Payer: Self-pay | Admitting: Oncology

## 2021-05-22 ENCOUNTER — Inpatient Hospital Stay: Payer: 59

## 2021-05-22 ENCOUNTER — Encounter: Payer: Self-pay | Admitting: Oncology

## 2021-05-22 ENCOUNTER — Inpatient Hospital Stay: Payer: 59 | Attending: Oncology

## 2021-05-22 ENCOUNTER — Other Ambulatory Visit: Payer: Self-pay

## 2021-05-22 ENCOUNTER — Inpatient Hospital Stay (HOSPITAL_BASED_OUTPATIENT_CLINIC_OR_DEPARTMENT_OTHER): Payer: 59 | Admitting: Oncology

## 2021-05-22 VITALS — BP 115/78 | HR 90 | Temp 97.3°F | Resp 18 | Wt 163.9 lb

## 2021-05-22 DIAGNOSIS — Z79811 Long term (current) use of aromatase inhibitors: Secondary | ICD-10-CM

## 2021-05-22 DIAGNOSIS — E2839 Other primary ovarian failure: Secondary | ICD-10-CM

## 2021-05-22 DIAGNOSIS — Z170 Estrogen receptor positive status [ER+]: Secondary | ICD-10-CM

## 2021-05-22 DIAGNOSIS — C50411 Malignant neoplasm of upper-outer quadrant of right female breast: Secondary | ICD-10-CM | POA: Insufficient documentation

## 2021-05-22 DIAGNOSIS — Z923 Personal history of irradiation: Secondary | ICD-10-CM | POA: Insufficient documentation

## 2021-05-22 DIAGNOSIS — M8580 Other specified disorders of bone density and structure, unspecified site: Secondary | ICD-10-CM | POA: Diagnosis not present

## 2021-05-22 DIAGNOSIS — Z5111 Encounter for antineoplastic chemotherapy: Secondary | ICD-10-CM | POA: Diagnosis not present

## 2021-05-22 DIAGNOSIS — Z808 Family history of malignant neoplasm of other organs or systems: Secondary | ICD-10-CM | POA: Insufficient documentation

## 2021-05-22 DIAGNOSIS — Z800 Family history of malignant neoplasm of digestive organs: Secondary | ICD-10-CM | POA: Diagnosis not present

## 2021-05-22 DIAGNOSIS — Z9221 Personal history of antineoplastic chemotherapy: Secondary | ICD-10-CM | POA: Diagnosis not present

## 2021-05-22 DIAGNOSIS — Z803 Family history of malignant neoplasm of breast: Secondary | ICD-10-CM | POA: Diagnosis not present

## 2021-05-22 DIAGNOSIS — M2550 Pain in unspecified joint: Secondary | ICD-10-CM | POA: Diagnosis not present

## 2021-05-22 DIAGNOSIS — Z79899 Other long term (current) drug therapy: Secondary | ICD-10-CM | POA: Diagnosis not present

## 2021-05-22 DIAGNOSIS — Z8043 Family history of malignant neoplasm of testis: Secondary | ICD-10-CM | POA: Insufficient documentation

## 2021-05-22 LAB — COMPREHENSIVE METABOLIC PANEL
ALT: 12 U/L (ref 0–44)
AST: 18 U/L (ref 15–41)
Albumin: 4.3 g/dL (ref 3.5–5.0)
Alkaline Phosphatase: 48 U/L (ref 38–126)
Anion gap: 7 (ref 5–15)
BUN: 17 mg/dL (ref 6–20)
CO2: 27 mmol/L (ref 22–32)
Calcium: 8.8 mg/dL — ABNORMAL LOW (ref 8.9–10.3)
Chloride: 101 mmol/L (ref 98–111)
Creatinine, Ser: 0.65 mg/dL (ref 0.44–1.00)
GFR, Estimated: >60 mL/min (ref >60)
Glucose, Bld: 93 mg/dL (ref 70–99)
Potassium: 3.9 mmol/L (ref 3.5–5.1)
Sodium: 135 mmol/L (ref 135–145)
Total Bilirubin: 0.2 mg/dL — ABNORMAL LOW (ref 0.3–1.2)
Total Protein: 7 g/dL (ref 6.5–8.1)

## 2021-05-22 LAB — CBC WITH DIFFERENTIAL/PLATELET
Abs Immature Granulocytes: 0.02 10*3/uL (ref 0.00–0.07)
Basophils Absolute: 0 10*3/uL (ref 0.0–0.1)
Basophils Relative: 1 %
Eosinophils Absolute: 0.1 10*3/uL (ref 0.0–0.5)
Eosinophils Relative: 1 %
HCT: 39.3 % (ref 36.0–46.0)
Hemoglobin: 13.1 g/dL (ref 12.0–15.0)
Immature Granulocytes: 0 %
Lymphocytes Relative: 25 %
Lymphs Abs: 1.7 10*3/uL (ref 0.7–4.0)
MCH: 29.8 pg (ref 26.0–34.0)
MCHC: 33.3 g/dL (ref 30.0–36.0)
MCV: 89.3 fL (ref 80.0–100.0)
Monocytes Absolute: 0.4 10*3/uL (ref 0.1–1.0)
Monocytes Relative: 6 %
Neutro Abs: 4.5 10*3/uL (ref 1.7–7.7)
Neutrophils Relative %: 67 %
Platelets: 258 10*3/uL (ref 150–400)
RBC: 4.4 MIL/uL (ref 3.87–5.11)
RDW: 11.4 % — ABNORMAL LOW (ref 11.5–15.5)
WBC: 6.7 10*3/uL (ref 4.0–10.5)
nRBC: 0 % (ref 0.0–0.2)

## 2021-05-22 MED ORDER — SODIUM CHLORIDE 0.9 % IV SOLN
INTRAVENOUS | Status: DC
  Filled 2021-05-22: qty 250

## 2021-05-22 MED ORDER — ZOLEDRONIC ACID 4 MG/100ML IV SOLN
4.0000 mg | Freq: Once | INTRAVENOUS | Status: AC
  Administered 2021-05-22: 4 mg via INTRAVENOUS
  Filled 2021-05-22: qty 100

## 2021-05-22 NOTE — Progress Notes (Signed)
Hematology/Oncology Follow up note [phone] [phone]   Patient Care Team: [name], NP as PCP - General (Nurse Practitioner) Theodore Demark, RN as Oncology Nurse Navigator [name], [name], MD as Referring Physician (Surgery) [name], MD as Consulting Physician (Oncology) [name], MD as Consulting Physician (General Surgery) [name], MD as Referring Physician (Radiation Oncology)  REASON FOR VISIT Follow up for management of breast cancer   HISTORY OF PRESENTING ILLNESS:  Anne Wu is a  40 y.o.  female with diagnosis of cT2N0M0, grade 2 invasive mammary carcinoma.  She has significant family history on both maternal side as well as paternal side. She felt a right breast mass 2 months ago and diagnostic mammogram showed right upper quandrant 2.2 cm mass, which was confirmed on [place] and biopsied. Right axillary no clinically suspicious lymph node. Pathology showed invasive mammary carcinoma, ER/PR positive,, HER2 IHC  equivocal, FISH negative.   # Patient was evaluated by Dr.Smith. From surgical standpoint, Dr.Smith recommend neoadjuvant chemotherapy to facilitate surgery to obtain negative margin and better surgical outcome. Also given patient's young age, neoadjuvant chemotherapy is reasonable.    #Patient stated that she does not desire future fertility.  # 2D Echo showed normal systolic function, mild mitral regurgitation.  # CT was done  to rule out metastatic disease given micrometastasis. Images were independently reviewed by me and discussed with patient.  Negative for metastatic disease. # Testing did not reveal any pathogenic mutation in any of these genes.  A copy of the genetic test report will be scanned into Epic under the Media tab.The genes analyzed were the 23 genes on [facility]'s Breast/GYN panel (ATM, BARD1, BRCA1, BRCA2, BRIP1, CDH1, CHEK2, DICER1, EPCAM, MLH1,  MSH2, MSH6, NBN, NF1, PALB2, PMS2, PTEN, RAD50,  RAD51C, RAD51D,SMARCA4, STK11, and TP53). 09/08/2017 Interval mammogram after 4 cycle of AC, showed good treatment response. The biopsy-proven malignancy in the upper outer quadrant of the right breast at posterior depth, associated with scattered microcalcifications and architectural distortion, has significantly decreased in size since the mammogram 06/20/2017. On today's mammogram, the mass measures approximately 1.7 x 2.2 x 1.2 cm (previously 2.4 x 1.6 x 2.7 cm).   Treatment Summary :  Jan 2019- May 2019 Neoadjuvant AC-->T  12/29/2017 Right lumpectomy and sentinel LN biopsy Pathology showed ypT2 ypN0 (i+) (sn) case was discussed on tumor board.ER/PR positive, HER 2 negative.  Aug 2019-September 2019 Adjuvant RT  Oct 2019 to start Zoladex and Aromasin. Mediport was removed.  INTERVAL HISTORY Anne Wu is a 40 y.o. female who has above history reviewed by me presents for follow up for breast cancer management  Patient is on ovarian suppression with Zoladex monthly.   Patient has been on Aromasin.  Overall she tolerates well. Patient has no new breast concerns.   Review of Systems  Constitutional:  Negative for appetite change, chills, fatigue and fever.  HENT:   Negative for hearing loss and voice change.   Eyes:  Negative for eye problems.  Respiratory:  Negative for chest tightness and cough.   Cardiovascular:  Negative for chest pain.  Gastrointestinal:  Negative for abdominal distention, abdominal pain and blood in stool.  Endocrine: Positive for hot flashes.  Genitourinary:  Negative for difficulty urinating and frequency.   Musculoskeletal:  Positive for arthralgias.  Skin:  Negative for itching and rash.  Neurological:  Negative for dizziness, extremity weakness and light-headedness.  Hematological:  Negative for adenopathy.  Psychiatric/Behavioral:  Negative for confusion. The patient is not nervous/anxious.  MEDICAL HISTORY:  Past Medical History:  Diagnosis Date    Asthma    AS A CHILD-NO INHALERS   Breast cancer ([place]) 2018   [facility]   Elevated blood pressure, situational    PT STATES HER LAST COUPLE OF MD APPOINTMENTS SHE HAS HAD ELEVATED BP-NEVER HAD A PROBLEM WITH THIS PREVIOUSLY   Genetic testing 09/05/2017   Breast/GYN panel (23 genes) @ [facility] - No pathogenic mutations detected   GERD (gastroesophageal reflux disease)    OCC- NO MEDS   Malignant neoplasm of upper-outer quadrant of right breast in female, estrogen receptor positive ([place]) 2019   [facility] and DCIS   Personal history of chemotherapy    Personal history of radiation therapy     SURGICAL HISTORY: Past Surgical History:  Procedure Laterality Date   BREAST BIOPSY Right 06/20/2017   Invasive Mammary Carcinoma   BREAST EXCISIONAL BIOPSY Right 12/29/2017   RESIDUAL INVASIVE MAMMARY CARCINOMA and DCIS   BREAST LUMPECTOMY Right 12/29/2017   needle localization and sentinel node injection   NO PAST SURGERIES     PARTIAL MASTECTOMY WITH NEEDLE LOCALIZATION Right 12/29/2017   Procedure: PARTIAL MASTECTOMY WITH NEEDLE LOCALIZATION;  Surgeon: [name], MD;  Location: [facility] ORS;  Service: General;  Laterality: Right;   PORTACATH PLACEMENT N/A 07/11/2017   Procedure: INSERTION PORT-A-CATH;  Surgeon: Leonie Green, MD;  Location: [facility] ORS;  Service: General;  Laterality: N/A;   SENTINEL NODE BIOPSY Right 12/29/2017   Procedure: SENTINEL NODE BIOPSY;  Surgeon: [name], MD;  Location: [facility] ORS;  Service: General;  Laterality: Right;    SOCIAL HISTORY: Social History   Socioeconomic History   Marital status: Married    Spouse name: Not on file   Number of children: Not on file   Years of education: Not on file   Highest education level: Not on file  Occupational History   Not on file  Tobacco Use   Smoking status: Never   Smokeless tobacco: Never  Vaping Use   Vaping Use: Never used  Substance and Sexual Activity   Alcohol use: No     Alcohol/week: 0.0 standard drinks   Drug use: No   Sexual activity: Yes  Other Topics Concern   Not on file  Social History Narrative   Not on file   Social Determinants of Health   Financial Resource Strain: Not on file  Food Insecurity: Not on file  Transportation Needs: Not on file  Physical Activity: Not on file  Stress: Not on file  Social Connections: Not on file  Intimate Partner Violence: Not on file    FAMILY HISTORY: Family History  Problem Relation Age of Onset   Breast cancer Maternal Aunt 69       currently late 20s   Breast cancer Maternal Grandmother 68       second breast vs. recurrence ca at 15; deceased at 72   Colon cancer Maternal Uncle 18       deceased in 53s   Breast cancer Paternal Aunt        age at dx unknown; currently in 23s   Breast cancer Paternal Grandmother        age at dx unknown   Melanoma Mother        on leg   Testicular cancer Maternal Uncle 58       currently 40s    ALLERGIES:  is allergic to codeine.  MEDICATIONS:  Current Outpatient Medications  Medication Sig Dispense Refill  acetaminophen (TYLENOL) 325 MG tablet Take 650 mg by mouth every 6 (six) hours as needed for moderate pain or headache.      Calcium Carbonate-Vit D-Min (CALCIUM 1200 PO) Take 1 tablet by mouth daily.     cholecalciferol (VITAMIN D) 1000 units tablet Take 1,000 Units by mouth daily.     ELDERBERRY PO Take 1 tablet by mouth daily.     exemestane (AROMASIN) 25 MG tablet Take 1 tablet (25 mg total) by mouth daily after breakfast. 90 tablet 1   lidocaine-prilocaine (EMLA) cream Apply 1 application topically as needed. 30 g 3   Multiple Vitamin (MULTI-VITAMINS) TABS Take 1 tablet by mouth daily.      No current facility-administered medications for this visit.   Facility-Administered Medications Ordered in Other Visits  Medication Dose Route Frequency Provider Last Rate Last Admin   0.9 %  sodium chloride infusion   Intravenous Continuous [name], MD    Stopped at 05/22/21 1450   heparin lock flush 100 unit/mL  500 Units Intravenous Once [name], MD       sodium chloride flush (NS) 0.9 % injection 10 mL  10 mL Intravenous PRN [name], MD   10 mL at 08/01/17 0829     PHYSICAL EXAMINATION: ECOG PERFORMANCE STATUS: 1 - Symptomatic but completely ambulatory Vitals:   05/22/21 1404  BP: 115/78  Pulse: 90  Resp: 18  Temp: (!) 97.3 F (36.3 C)   Filed Weights   05/22/21 1404  Weight: 163 lb 14.4 oz (74.3 kg)    Physical Exam Constitutional:      General: She is not in acute distress.    Appearance: She is not diaphoretic.  HENT:     Head: Normocephalic and atraumatic.     Nose: Nose normal.     Mouth/Throat:     Pharynx: No oropharyngeal exudate.  Eyes:     General: No scleral icterus.    Pupils: Pupils are equal, round, and reactive to light.  Neck:     Vascular: No JVD.  Cardiovascular:     Rate and Rhythm: Normal rate and regular rhythm.     Heart sounds: Normal heart sounds. No murmur heard.   No friction rub.  Pulmonary:     Effort: Pulmonary effort is normal. No respiratory distress.     Breath sounds: Normal breath sounds. No rales.  Chest:     Chest wall: No tenderness.  Abdominal:     General: Bowel sounds are normal. There is no distension.     Palpations: Abdomen is soft.     Tenderness: There is no abdominal tenderness.  Musculoskeletal:        General: No tenderness or deformity. Normal range of motion.     Cervical back: Normal range of motion and neck supple.  Lymphadenopathy:     Cervical: No cervical adenopathy.  Skin:    General: Skin is warm and dry.     Findings: No erythema.  Neurological:     Mental Status: She is alert and oriented to person, place, and time.     Cranial Nerves: No cranial nerve deficit.     Motor: No abnormal muscle tone.     Coordination: Coordination normal.  Psychiatric:        Mood and Affect: Affect normal.    Lab Results  Component Value Date   WBC 6.7  05/22/2021   HGB 13.1 05/22/2021   HCT 39.3 05/22/2021   MCV 89.3 05/22/2021   PLT  258 05/22/2021   Recent Labs    11/22/20 1308 02/27/21 1422 05/22/21 1347  NA 136 137 135  K 4.1 4.2 3.9  CL 100 100 101  CO2 '26 30 27  ' GLUCOSE 101* 91 93  BUN '20 15 17  ' CREATININE 0.64 0.66 0.65  CALCIUM 9.0 9.4 8.8*  GFRNONAA >60 >60 >60  PROT 7.3 7.2 7.0  ALBUMIN 4.2 4.4 4.3  AST '23 18 18  ' ALT '17 13 12  ' ALKPHOS 56 44 48  BILITOT 0.4 0.6 0.2*    RADIOGRAPHIC STUDIES: I have personally reviewed the radiological images as listed and agreed with the findings in the report. No results found.   ASSESSMENT & PLAN:  1. Malignant neoplasm of upper-outer quadrant of right breast in female, estrogen receptor positive ([place])   2. Aromatase inhibitor use   3. Suppression of ovarian secretion   Cancer Staging Malignant neoplasm of upper-outer quadrant of right breast in female, estrogen receptor positive ([place]) Staging form: Breast, AJCC 8th Edition - Clinical stage from 07/15/2017: Stage IB (cT2, cN0, cM0, G2, ER+, PR+, HER2-) - Signed by [name], MD on 07/15/2017 #ypT2 ypN0 (i+) (sn), grade 2 breast cancer, ER/PR positive, HER 2 negative. Reviewed and discussed with patient Continue Aromasin.  Proceed with Zoladex today.  We will check coverage for Zoladex la CA 15.3, CA 27.29 are are slightly elevated with no CT evidence of metastasis- 09/12/20 . Tumor markers are pending today. Annual mammogram scheduled scheduled in December 2022-Dr. Cintron's office  #Chronic aromatase inhibitor use,  01/16/2021, DEXA showed osteopenia, right femoral neck T- 1.2, stable Proceed with Zometa today-every 6 months Recommend patient to continue calcium and vitamin D supplementation.  Calcium is slightly low at 8.8.   We spent sufficient time to discuss many aspect of care, questions were answered to patient's satisfaction. The patient knows to call the clinic with any problems questions or concerns.  Return visit:  She will follow-up monthly for Zoladex injections Follow-up in 6 months, lab MD Zoladex +/- Zometa.    [name], MD, PhD Hematology Oncology  05/22/2021

## 2021-05-22 NOTE — Patient Instructions (Signed)
[facility]  Discharge Instructions: Thank you for choosing [facility] to provide your oncology and hematology care.  If you have a lab appointment with the Cancer Center, please go directly to the Cancer Center and check in at the registration area.  Wear comfortable clothing and clothing appropriate for easy access to any Portacath or PICC line.   We strive to give you quality time with your provider. You may need to reschedule your appointment if you arrive late (15 or more minutes).  Arriving late affects you and other patients whose appointments are after yours.  Also, if you miss three or more appointments without notifying the office, you may be dismissed from the clinic at the provider's discretion.      For prescription refill requests, have your pharmacy contact our office and allow 72 hours for refills to be completed.      To help prevent nausea and vomiting after your treatment, we encourage you to take your nausea medication as directed.  BELOW ARE SYMPTOMS THAT SHOULD BE REPORTED IMMEDIATELY: *FEVER GREATER THAN 100.4 F (38 C) OR HIGHER *CHILLS OR SWEATING *NAUSEA AND VOMITING THAT IS NOT CONTROLLED WITH YOUR NAUSEA MEDICATION *UNUSUAL SHORTNESS OF BREATH *UNUSUAL BRUISING OR BLEEDING *URINARY PROBLEMS (pain or burning when urinating, or frequent urination) *BOWEL PROBLEMS (unusual diarrhea, constipation, pain near the anus) TENDERNESS IN MOUTH AND THROAT WITH OR WITHOUT PRESENCE OF ULCERS (sore throat, sores in mouth, or a toothache) UNUSUAL RASH, SWELLING OR PAIN  UNUSUAL VAGINAL DISCHARGE OR ITCHING   Items with * indicate a potential emergency and should be followed up as soon as possible or go to the Emergency Department if any problems should occur.  Please show the CHEMOTHERAPY ALERT CARD or IMMUNOTHERAPY ALERT CARD at check-in to the Emergency Department and triage nurse.  Should you have questions after your  visit or need to cancel or reschedule your appointment, please contact [facility]  [phone] and follow the prompts.  Office hours are 8:00 a.m. to 4:30 p.m. Monday - Friday. Please note that voicemails left after 4:00 p.m. may not be returned until the following business day.  We are closed weekends and major holidays. You have access to a nurse at all times for urgent questions. Please call the main number to the clinic [phone] and follow the prompts.  For any non-urgent questions, you may also contact your provider using MyChart. We now offer e-Visits for anyone 18 and older to request care online for non-urgent symptoms. For details visit [email or web address].   Also download the MyChart app! Go to the app store, search "MyChart", open the app, select [facility], and log in with your MyChart username and password.  Due to Covid, a mask is required upon entering the hospital/clinic. If you do not have a mask, one will be given to you upon arrival. For doctor visits, patients may have 1 support person aged 18 or older with them. For treatment visits, patients cannot have anyone with them due to current Covid guidelines and our immunocompromised population.  

## 2021-05-22 NOTE — Progress Notes (Signed)
Pt here for follow up. No new concerns voiced. No new breast problems  

## 2021-05-22 NOTE — Progress Notes (Signed)
Pt unable to receive zoladex today as it was to early- not spaced 28 days apart. Rescheduled to 11/10 for injection only . Pt aware.

## 2021-05-23 LAB — CANCER ANTIGEN 27.29: CA 27.29: 35 U/mL (ref 0.0–38.6)

## 2021-05-23 LAB — CANCER ANTIGEN 15-3: CA 15-3: 28.4 U/mL — ABNORMAL HIGH (ref 0.0–25.0)

## 2021-05-24 ENCOUNTER — Inpatient Hospital Stay: Payer: 59

## 2021-05-24 ENCOUNTER — Other Ambulatory Visit: Payer: Self-pay

## 2021-05-24 DIAGNOSIS — Z170 Estrogen receptor positive status [ER+]: Secondary | ICD-10-CM

## 2021-05-24 DIAGNOSIS — Z5111 Encounter for antineoplastic chemotherapy: Secondary | ICD-10-CM | POA: Diagnosis not present

## 2021-05-24 DIAGNOSIS — C50411 Malignant neoplasm of upper-outer quadrant of right female breast: Secondary | ICD-10-CM

## 2021-05-24 MED ORDER — GOSERELIN ACETATE 3.6 MG ~~LOC~~ IMPL
3.6000 mg | DRUG_IMPLANT | Freq: Once | SUBCUTANEOUS | Status: AC
  Administered 2021-05-24: 3.6 mg via SUBCUTANEOUS
  Filled 2021-05-24: qty 3.6

## 2021-06-19 ENCOUNTER — Ambulatory Visit: Payer: 59

## 2021-06-21 ENCOUNTER — Ambulatory Visit: Payer: 59

## 2021-06-21 ENCOUNTER — Inpatient Hospital Stay: Payer: 59 | Attending: Oncology

## 2021-06-21 ENCOUNTER — Other Ambulatory Visit: Payer: Self-pay

## 2021-06-21 DIAGNOSIS — C50411 Malignant neoplasm of upper-outer quadrant of right female breast: Secondary | ICD-10-CM | POA: Diagnosis present

## 2021-06-21 DIAGNOSIS — Z5111 Encounter for antineoplastic chemotherapy: Secondary | ICD-10-CM | POA: Insufficient documentation

## 2021-06-21 DIAGNOSIS — Z170 Estrogen receptor positive status [ER+]: Secondary | ICD-10-CM | POA: Diagnosis not present

## 2021-06-21 DIAGNOSIS — Z79811 Long term (current) use of aromatase inhibitors: Secondary | ICD-10-CM | POA: Insufficient documentation

## 2021-06-21 MED ORDER — GOSERELIN ACETATE 3.6 MG ~~LOC~~ IMPL
3.6000 mg | DRUG_IMPLANT | Freq: Once | SUBCUTANEOUS | Status: AC
  Administered 2021-06-21: 3.6 mg via SUBCUTANEOUS
  Filled 2021-06-21: qty 3.6

## 2021-07-17 ENCOUNTER — Ambulatory Visit: Payer: 59

## 2021-07-26 ENCOUNTER — Ambulatory Visit: Payer: 59

## 2021-07-26 ENCOUNTER — Inpatient Hospital Stay: Payer: 59 | Attending: Oncology

## 2021-07-26 ENCOUNTER — Other Ambulatory Visit: Payer: Self-pay

## 2021-07-26 DIAGNOSIS — Z803 Family history of malignant neoplasm of breast: Secondary | ICD-10-CM | POA: Insufficient documentation

## 2021-07-26 DIAGNOSIS — Z5111 Encounter for antineoplastic chemotherapy: Secondary | ICD-10-CM | POA: Insufficient documentation

## 2021-07-26 DIAGNOSIS — Z808 Family history of malignant neoplasm of other organs or systems: Secondary | ICD-10-CM | POA: Insufficient documentation

## 2021-07-26 DIAGNOSIS — Z79899 Other long term (current) drug therapy: Secondary | ICD-10-CM | POA: Diagnosis not present

## 2021-07-26 DIAGNOSIS — Z800 Family history of malignant neoplasm of digestive organs: Secondary | ICD-10-CM | POA: Insufficient documentation

## 2021-07-26 DIAGNOSIS — Z79811 Long term (current) use of aromatase inhibitors: Secondary | ICD-10-CM | POA: Diagnosis not present

## 2021-07-26 DIAGNOSIS — R232 Flushing: Secondary | ICD-10-CM | POA: Diagnosis not present

## 2021-07-26 DIAGNOSIS — Z8043 Family history of malignant neoplasm of testis: Secondary | ICD-10-CM | POA: Insufficient documentation

## 2021-07-26 DIAGNOSIS — Z170 Estrogen receptor positive status [ER+]: Secondary | ICD-10-CM | POA: Diagnosis not present

## 2021-07-26 DIAGNOSIS — C50411 Malignant neoplasm of upper-outer quadrant of right female breast: Secondary | ICD-10-CM | POA: Insufficient documentation

## 2021-07-26 DIAGNOSIS — M2550 Pain in unspecified joint: Secondary | ICD-10-CM | POA: Diagnosis not present

## 2021-07-26 MED ORDER — GOSERELIN ACETATE 3.6 MG ~~LOC~~ IMPL
3.6000 mg | DRUG_IMPLANT | Freq: Once | SUBCUTANEOUS | Status: AC
  Administered 2021-07-26: 3.6 mg via SUBCUTANEOUS
  Filled 2021-07-26: qty 3.6

## 2021-08-10 ENCOUNTER — Ambulatory Visit
Admission: RE | Admit: 2021-08-10 | Discharge: 2021-08-10 | Disposition: A | Discharge status: Home / Self Care | Payer: 59 | Source: Ambulatory Visit | Attending: General Surgery | Admitting: General Surgery

## 2021-08-10 ENCOUNTER — Other Ambulatory Visit: Payer: Self-pay

## 2021-08-10 DIAGNOSIS — Z1231 Encounter for screening mammogram for malignant neoplasm of breast: Secondary | ICD-10-CM | POA: Insufficient documentation

## 2021-08-10 DIAGNOSIS — Z853 Personal history of malignant neoplasm of breast: Secondary | ICD-10-CM | POA: Insufficient documentation

## 2021-08-21 ENCOUNTER — Ambulatory Visit: Payer: 59

## 2021-08-23 ENCOUNTER — Inpatient Hospital Stay: Payer: 59 | Attending: Oncology

## 2021-08-23 ENCOUNTER — Other Ambulatory Visit: Payer: Self-pay

## 2021-08-23 ENCOUNTER — Ambulatory Visit: Payer: 59

## 2021-08-23 DIAGNOSIS — Z79811 Long term (current) use of aromatase inhibitors: Secondary | ICD-10-CM | POA: Insufficient documentation

## 2021-08-23 DIAGNOSIS — Z5111 Encounter for antineoplastic chemotherapy: Secondary | ICD-10-CM | POA: Insufficient documentation

## 2021-08-23 DIAGNOSIS — C50411 Malignant neoplasm of upper-outer quadrant of right female breast: Secondary | ICD-10-CM | POA: Insufficient documentation

## 2021-08-23 DIAGNOSIS — Z170 Estrogen receptor positive status [ER+]: Secondary | ICD-10-CM | POA: Insufficient documentation

## 2021-08-23 DIAGNOSIS — M8580 Other specified disorders of bone density and structure, unspecified site: Secondary | ICD-10-CM | POA: Insufficient documentation

## 2021-08-23 MED ORDER — GOSERELIN ACETATE 3.6 MG ~~LOC~~ IMPL
3.6000 mg | DRUG_IMPLANT | Freq: Once | SUBCUTANEOUS | Status: AC
  Administered 2021-08-23: 3.6 mg via SUBCUTANEOUS
  Filled 2021-08-23: qty 3.6

## 2021-09-18 ENCOUNTER — Ambulatory Visit: Payer: 59

## 2021-09-20 ENCOUNTER — Inpatient Hospital Stay: Payer: 59 | Attending: Oncology

## 2021-09-20 ENCOUNTER — Other Ambulatory Visit: Payer: Self-pay

## 2021-09-20 ENCOUNTER — Ambulatory Visit: Payer: 59

## 2021-09-20 DIAGNOSIS — Z170 Estrogen receptor positive status [ER+]: Secondary | ICD-10-CM | POA: Insufficient documentation

## 2021-09-20 DIAGNOSIS — C50411 Malignant neoplasm of upper-outer quadrant of right female breast: Secondary | ICD-10-CM | POA: Diagnosis present

## 2021-09-20 DIAGNOSIS — Z5111 Encounter for antineoplastic chemotherapy: Secondary | ICD-10-CM | POA: Insufficient documentation

## 2021-09-20 MED ORDER — GOSERELIN ACETATE 3.6 MG ~~LOC~~ IMPL
3.6000 mg | DRUG_IMPLANT | Freq: Once | SUBCUTANEOUS | Status: AC
  Administered 2021-09-20: 14:00:00 3.6 mg via SUBCUTANEOUS
  Filled 2021-09-20: qty 3.6

## 2021-10-16 ENCOUNTER — Ambulatory Visit: Payer: 59

## 2021-10-25 ENCOUNTER — Inpatient Hospital Stay: Payer: 59

## 2021-10-25 ENCOUNTER — Inpatient Hospital Stay: Payer: 59 | Attending: Oncology

## 2021-10-25 ENCOUNTER — Ambulatory Visit: Payer: 59

## 2021-10-25 DIAGNOSIS — Z5111 Encounter for antineoplastic chemotherapy: Secondary | ICD-10-CM | POA: Diagnosis not present

## 2021-10-25 DIAGNOSIS — Z170 Estrogen receptor positive status [ER+]: Secondary | ICD-10-CM | POA: Diagnosis not present

## 2021-10-25 DIAGNOSIS — C50411 Malignant neoplasm of upper-outer quadrant of right female breast: Secondary | ICD-10-CM | POA: Insufficient documentation

## 2021-10-25 MED ORDER — GOSERELIN ACETATE 3.6 MG ~~LOC~~ IMPL
3.6000 mg | DRUG_IMPLANT | Freq: Once | SUBCUTANEOUS | Status: AC
  Administered 2021-10-25: 3.6 mg via SUBCUTANEOUS
  Filled 2021-10-25: qty 3.6

## 2021-11-13 ENCOUNTER — Ambulatory Visit: Payer: 59 | Admitting: Oncology

## 2021-11-13 ENCOUNTER — Ambulatory Visit: Payer: 59

## 2021-11-13 ENCOUNTER — Other Ambulatory Visit: Payer: 59

## 2021-11-15 ENCOUNTER — Ambulatory Visit: Payer: 59 | Admitting: Oncology

## 2021-11-15 ENCOUNTER — Ambulatory Visit: Payer: 59

## 2021-11-15 ENCOUNTER — Other Ambulatory Visit: Payer: 59

## 2021-11-21 ENCOUNTER — Other Ambulatory Visit: Payer: Self-pay

## 2021-11-21 DIAGNOSIS — C50411 Malignant neoplasm of upper-outer quadrant of right female breast: Secondary | ICD-10-CM

## 2021-11-22 ENCOUNTER — Inpatient Hospital Stay: Payer: 59 | Attending: Oncology | Admitting: Oncology

## 2021-11-22 ENCOUNTER — Inpatient Hospital Stay: Payer: 59

## 2021-11-22 ENCOUNTER — Encounter: Payer: Self-pay | Admitting: Oncology

## 2021-11-22 VITALS — BP 118/78 | HR 89 | Temp 97.9°F | Resp 18 | Wt 164.4 lb

## 2021-11-22 DIAGNOSIS — E2839 Other primary ovarian failure: Secondary | ICD-10-CM | POA: Diagnosis not present

## 2021-11-22 DIAGNOSIS — C50411 Malignant neoplasm of upper-outer quadrant of right female breast: Secondary | ICD-10-CM | POA: Diagnosis present

## 2021-11-22 DIAGNOSIS — Z79811 Long term (current) use of aromatase inhibitors: Secondary | ICD-10-CM | POA: Diagnosis not present

## 2021-11-22 DIAGNOSIS — Z170 Estrogen receptor positive status [ER+]: Secondary | ICD-10-CM | POA: Diagnosis not present

## 2021-11-22 DIAGNOSIS — M85851 Other specified disorders of bone density and structure, right thigh: Secondary | ICD-10-CM | POA: Diagnosis not present

## 2021-11-22 LAB — COMPREHENSIVE METABOLIC PANEL
ALT: 13 U/L (ref 0–44)
AST: 23 U/L (ref 15–41)
Albumin: 4.1 g/dL (ref 3.5–5.0)
Alkaline Phosphatase: 51 U/L (ref 38–126)
Anion gap: 7 (ref 5–15)
BUN: 16 mg/dL (ref 6–20)
CO2: 27 mmol/L (ref 22–32)
Calcium: 8.9 mg/dL (ref 8.9–10.3)
Chloride: 102 mmol/L (ref 98–111)
Creatinine, Ser: 0.52 mg/dL (ref 0.44–1.00)
GFR, Estimated: >60 mL/min (ref >60)
Glucose, Bld: 122 mg/dL — ABNORMAL HIGH (ref 70–99)
Potassium: 3.8 mmol/L (ref 3.5–5.1)
Sodium: 136 mmol/L (ref 135–145)
Total Bilirubin: 0.5 mg/dL (ref 0.3–1.2)
Total Protein: 7.1 g/dL (ref 6.5–8.1)

## 2021-11-22 LAB — CBC WITH DIFFERENTIAL/PLATELET
Abs Immature Granulocytes: 0.03 10*3/uL (ref 0.00–0.07)
Basophils Absolute: 0 10*3/uL (ref 0.0–0.1)
Basophils Relative: 0 %
Eosinophils Absolute: 0.1 10*3/uL (ref 0.0–0.5)
Eosinophils Relative: 1 %
HCT: 37.8 % (ref 36.0–46.0)
Hemoglobin: 12.7 g/dL (ref 12.0–15.0)
Immature Granulocytes: 0 %
Lymphocytes Relative: 20 %
Lymphs Abs: 1.5 10*3/uL (ref 0.7–4.0)
MCH: 29.9 pg (ref 26.0–34.0)
MCHC: 33.6 g/dL (ref 30.0–36.0)
MCV: 88.9 fL (ref 80.0–100.0)
Monocytes Absolute: 0.3 10*3/uL (ref 0.1–1.0)
Monocytes Relative: 4 %
Neutro Abs: 5.8 10*3/uL (ref 1.7–7.7)
Neutrophils Relative %: 75 %
Platelets: 265 10*3/uL (ref 150–400)
RBC: 4.25 MIL/uL (ref 3.87–5.11)
RDW: 11.6 % (ref 11.5–15.5)
WBC: 7.8 10*3/uL (ref 4.0–10.5)
nRBC: 0 % (ref 0.0–0.2)

## 2021-11-22 MED ORDER — ZOLEDRONIC ACID 4 MG/100ML IV SOLN
4.0000 mg | Freq: Once | INTRAVENOUS | Status: AC
  Administered 2021-11-22: 4 mg via INTRAVENOUS
  Filled 2021-11-22: qty 100

## 2021-11-22 MED ORDER — GOSERELIN ACETATE 3.6 MG ~~LOC~~ IMPL
3.6000 mg | DRUG_IMPLANT | Freq: Once | SUBCUTANEOUS | Status: AC
  Administered 2021-11-22: 3.6 mg via SUBCUTANEOUS
  Filled 2021-11-22: qty 3.6

## 2021-11-22 MED ORDER — EXEMESTANE 25 MG PO TABS: 25.0000 mg | ORAL_TABLET | Freq: Every day | ORAL | 1 refills | Status: DC

## 2021-11-22 NOTE — Progress Notes (Signed)
?Hematology/Oncology Progress note ?[phone] [phone] ?  ? ? ? ?Patient Care Team: ?[name], NP as PCP - General (Nurse Practitioner) ?Theodore Demark, RN as Oncology Nurse Navigator ?Leonie Green, MD as Referring Physician (Surgery) ?[name], MD as Consulting Physician (Oncology) ?[name], MD as Consulting Physician (General Surgery) ?[name], MD as Referring Physician (Radiation Oncology) ? ?REASON FOR VISIT ?Follow up for management of breast cancer ? ? ?HISTORY OF PRESENTING ILLNESS:  ?Anne Wu is a  41 y.o.  female with diagnosis of cT2N0M0, grade 2 invasive mammary carcinoma.  ?She has significant family history on both maternal side as well as paternal side. She felt a right breast mass 2 months ago and diagnostic mammogram showed right upper quandrant 2.2 cm mass, which was confirmed on [place] and biopsied. Right axillary no clinically suspicious lymph node. Pathology showed invasive mammary carcinoma, ER/PR positive,, HER2 IHC  equivocal, FISH negative.  ? ?# Patient was evaluated by Dr.Smith. From surgical standpoint, Dr.Smith recommend neoadjuvant chemotherapy to facilitate surgery to obtain negative margin and better surgical outcome. Also given patient's young age, neoadjuvant chemotherapy is reasonable.   ? ?#Patient stated that she does not desire future fertility.  ?# 2D Echo showed normal systolic function, mild mitral regurgitation.  ?# CT was done  to rule out metastatic disease given micrometastasis. ?Images were independently reviewed by me and discussed with patient.  Negative for metastatic disease. ?# Testing did not reveal any pathogenic mutation in any of these genes.  A copy of the genetic test report will be scanned into Epic under the Media tab.The genes analyzed were the 23 genes on [facility]'s Breast/GYN panel (ATM, BARD1, BRCA1, BRCA2, BRIP1, CDH1, CHEK2, DICER1, EPCAM, MLH1,  MSH2, MSH6, NBN, NF1, PALB2, PMS2, PTEN,  RAD50, RAD51C, RAD51D,SMARCA4, STK11, and TP53). ?09/08/2017 Interval mammogram after 4 cycle of AC, showed good treatment response. The biopsy-proven malignancy in the upper outer quadrant of the right breast at posterior depth, associated with scattered microcalcifications and architectural distortion, has significantly decreased in size since the mammogram 06/20/2017. On today's mammogram, the mass measures approximately 1.7 x 2.2 x 1.2 cm (previously 2.4 x 1.6 x 2.7 cm).  ? ?Treatment Summary :  ?Jan 2019- May 2019 Neoadjuvant AC-->T  ?12/29/2017 Right lumpectomy and sentinel LN biopsy ?Pathology showed ypT2 ypN0 (i+) (sn) case was discussed on tumor board.ER/PR positive, HER 2 negative.  ?Aug 2019-September 2019 Adjuvant RT ? ?Oct 2019 to start Zoladex and Aromasin. ?Mediport was removed. ? ?INTERVAL HISTORY ?Anne Wu is a 41 y.o. female who has above history reviewed by me presents for follow up for breast cancer management  ?Patient is on ovarian suppression with Zoladex monthly.   ?Patient takes Aromasin.  Manageable side effects.+ hot flash.  ?08/13/2021, bilateral screening mammogram showed no findings suspicious for malignancy ? ?Review of Systems  ?Constitutional:  Negative for appetite change, chills, fatigue and fever.  ?HENT:   Negative for hearing loss and voice change.   ?Eyes:  Negative for eye problems.  ?Respiratory:  Negative for chest tightness and cough.   ?Cardiovascular:  Negative for chest pain.  ?Gastrointestinal:  Negative for abdominal distention, abdominal pain and blood in stool.  ?Endocrine: Positive for hot flashes.  ?Genitourinary:  Negative for difficulty urinating and frequency.   ?Musculoskeletal:  Positive for arthralgias.  ?Skin:  Negative for itching and rash.  ?Neurological:  Negative for dizziness, extremity weakness and light-headedness.  ?Hematological:  Negative for adenopathy.  ?Psychiatric/Behavioral:  Negative for confusion.  The patient is not nervous/anxious.    ? ?MEDICAL HISTORY:  ?Past Medical History:  ?Diagnosis Date  ? Asthma   ? AS A CHILD-NO INHALERS  ? Breast cancer ([place]) 2018  ? [facility]  ? Elevated blood pressure, situational   ? PT STATES HER LAST COUPLE OF MD APPOINTMENTS SHE HAS HAD ELEVATED BP-NEVER HAD A PROBLEM WITH THIS PREVIOUSLY  ? Genetic testing 09/05/2017  ? Breast/GYN panel (23 genes) @ [facility] - No pathogenic mutations detected  ? GERD (gastroesophageal reflux disease)   ? OCC- NO MEDS  ? Malignant neoplasm of upper-outer quadrant of right breast in female, estrogen receptor positive ([place]) 2019  ? [facility] and DCIS  ? Personal history of chemotherapy   ? Personal history of radiation therapy   ? ? ?SURGICAL HISTORY: ?Past Surgical History:  ?Procedure Laterality Date  ? BREAST BIOPSY Right 06/20/2017  ? Invasive Mammary Carcinoma  ? BREAST EXCISIONAL BIOPSY Right 12/29/2017  ? RESIDUAL INVASIVE MAMMARY CARCINOMA and DCIS  ? BREAST LUMPECTOMY Right 12/29/2017  ? needle localization and sentinel node injection  ? NO PAST SURGERIES    ? PARTIAL MASTECTOMY WITH NEEDLE LOCALIZATION Right 12/29/2017  ? Procedure: PARTIAL MASTECTOMY WITH NEEDLE LOCALIZATION;  Surgeon: [name], MD;  Location: [facility] ORS;  Service: General;  Laterality: Right;  ? PORTACATH PLACEMENT N/A 07/11/2017  ? Procedure: INSERTION PORT-A-CATH;  Surgeon: Leonie Green, MD;  Location: [facility] ORS;  Service: General;  Laterality: N/A;  ? SENTINEL NODE BIOPSY Right 12/29/2017  ? Procedure: SENTINEL NODE BIOPSY;  Surgeon: [name], MD;  Location: [facility] OR[facility];  Service: General;  Laterality: Right;  ? ? ?SOCIAL HISTORY: ?Social History  ? ?Socioeconomic History  ? Marital status: Married  ?  Spouse name: Not on file  ? Number of children: Not on file  ? Years of education: Not on file  ? Highest education level: Not on file  ?Occupational History  ? Not on file  ?Tobacco Use  ? Smoking status: Never  ? Smokeless tobacco: Never  ?Vaping Use  ? Vaping Use:  Never used  ?Substance and Sexual Activity  ? Alcohol use: No  ?  Alcohol/week: 0.0 standard drinks  ? Drug use: No  ? Sexual activity: Yes  ?Other Topics Concern  ? Not on file  ?Social History Narrative  ? Not on file  ? ?Social Determinants of Health  ? ?Financial Resource Strain: Not on file  ?Food Insecurity: Not on file  ?Transportation Needs: Not on file  ?Physical Activity: Not on file  ?Stress: Not on file  ?Social Connections: Not on file  ?Intimate Partner Violence: Not on file  ? ? ?FAMILY HISTORY: ?Family History  ?Problem Relation Age of Onset  ? Breast cancer Maternal Aunt 63  ?     currently late 84s  ? Breast cancer Maternal Grandmother 68  ?     second breast vs. recurrence ca at 21; deceased at 46  ? Colon cancer Maternal Uncle 64  ?     deceased in 68s  ? Breast cancer Paternal Aunt   ?     age at dx unknown; currently in 54s  ? Breast cancer Paternal Grandmother   ?     age at dx unknown  ? Melanoma Mother   ?     on leg  ? Testicular cancer Maternal Uncle 30  ?     currently 73s  ? ? ?ALLERGIES:  is allergic to codeine. ? ?MEDICATIONS:  ?Current  Outpatient Medications  ?Medication Sig Dispense Refill  ? Calcium Carbonate-Vit D-Min (CALCIUM 1200 PO) Take 1 tablet by mouth daily.    ? cholecalciferol (VITAMIN D) 1000 units tablet Take 1,000 Units by mouth daily.    ? ELDERBERRY PO Take 1 tablet by mouth daily.    ? lidocaine-prilocaine (EMLA) cream Apply 1 application topically as needed. 30 g 3  ? loratadine (CLARITIN) 10 MG tablet Take 10 mg by mouth daily.    ? Multiple Vitamin (MULTI-VITAMINS) TABS Take 1 tablet by mouth daily.     ? acetaminophen (TYLENOL) 325 MG tablet Take 650 mg by mouth every 6 (six) hours as needed for moderate pain or headache.  (Patient not taking: Reported on 11/22/2021)    ? exemestane (AROMASIN) 25 MG tablet Take 1 tablet (25 mg total) by mouth daily after breakfast. 90 tablet 1  ? ?No current facility-administered medications for this visit.   ? ?Facility-Administered Medications Ordered in Other Visits  ?Medication Dose Route Frequency Provider Last Rate Last Admin  ? heparin lock flush 100 unit/mL  500 Units Intravenous Once [name], MD      ? sodium chloride flush (NS)

## 2021-11-22 NOTE — Progress Notes (Signed)
Pt here for follow up. No new concerns voiced.   

## 2021-11-22 NOTE — Patient Instructions (Signed)
Goserelin injection ?What is this medication? ?GOSERELIN (GOE se rel in) is similar to a hormone found in the body. It lowers the amount of sex hormones that the body makes. Men will have lower testosterone levels and women will have lower estrogen levels while taking this medicine. In men, this medicine is used to treat prostate cancer; the injection is either given once per month or once every 12 weeks. A once per month injection (only) is used to treat women with endometriosis, dysfunctional uterine bleeding, or advanced breast cancer. ?This medicine may be used for other purposes; ask your health care provider or pharmacist if you have questions. ?COMMON BRAND NAME(S): Zoladex, Zoladex 59-Month ?What should I tell my care team before I take this medication? ?They need to know if you have any of these conditions: ?bone problems ?diabetes ?heart disease ?history of irregular heartbeat ?an unusual or allergic reaction to goserelin, other medicines, foods, dyes, or preservatives ?pregnant or trying to get pregnant ?breast-feeding ?How should I use this medication? ?This medicine is for injection under the skin. It is given by a health care professional in a hospital or clinic setting. ?Talk to your pediatrician regarding the use of this medicine in children. Special care may be needed. ?Overdosage: If you think you have taken too much of this medicine contact a poison control center or emergency room at once. ?NOTE: This medicine is only for you. Do not share this medicine with others. ?What if I miss a dose? ?It is important not to miss your dose. Call your doctor or health care professional if you are unable to keep an appointment. ?What may interact with this medication? ?Do not take this medicine with any of the following medications: ?cisapride ?dronedarone ?pimozide ?thioridazine ?This medicine may also interact with the following medications: ?other medicines that prolong the QT interval (an abnormal heart  rhythm) ?This list may not describe all possible interactions. Give your health care provider a list of all the medicines, herbs, non-prescription drugs, or dietary supplements you use. Also tell them if you smoke, drink alcohol, or use illegal drugs. Some items may interact with your medicine. ?What should I watch for while using this medication? ?Visit your doctor or health care provider for regular checks on your progress. Your symptoms may appear to get worse during the first weeks of this therapy. Tell your doctor or healthcare provider if your symptoms do not start to get better or if they get worse after this time. ?Your bones may get weaker if you take this medicine for a long time. If you smoke or frequently drink alcohol you may increase your risk of bone loss. A family history of osteoporosis, chronic use of drugs for seizures (convulsions), or corticosteroids can also increase your risk of bone loss. Talk to your doctor about how to keep your bones strong. ?This medicine should stop regular monthly menstruation in women. Tell your doctor if you continue to menstruate. ?Women should not become pregnant while taking this medicine or for 12 weeks after stopping this medicine. Women should inform their doctor if they wish to become pregnant or think they might be pregnant. There is a potential for serious side effects to an unborn child. Talk to your health care professional or pharmacist for more information. Do not breast-feed an infant while taking this medicine. ?Men should inform their doctors if they wish to father a child. This medicine may lower sperm counts. Talk to your health care professional or pharmacist for more information. ?This  medicine may increase blood sugar. Ask your healthcare provider if changes in diet or medicines are needed if you have diabetes. ?What side effects may I notice from receiving this medication? ?Side effects that you should report to your doctor or health care  professional as soon as possible: ?allergic reactions like skin rash, itching or hives, swelling of the face, lips, or tongue ?bone pain ?breathing problems ?changes in vision ?chest pain ?feeling faint or lightheaded, falls ?fever, chills ?pain, swelling, warmth in the leg ?pain, tingling, numbness in the hands or feet ?signs and symptoms of high blood sugar such as being more thirsty or hungry or having to urinate more than normal. You may also feel very tired or have blurry vision ?signs and symptoms of low blood pressure like dizziness; feeling faint or lightheaded, falls; unusually weak or tired ?stomach pain ?swelling of the ankles, feet, hands ?trouble passing urine or change in the amount of urine ?unusually high or low blood pressure ?unusually weak or tired ?Side effects that usually do not require medical attention (report to your doctor or health care professional if they continue or are bothersome): ?change in sex drive or performance ?changes in breast size in both males and females ?changes in emotions or moods ?headache ?hot flashes ?irritation at site where injected ?loss of appetite ?skin problems like acne, dry skin ?vaginal dryness ?This list may not describe all possible side effects. Call your doctor for medical advice about side effects. You may report side effects to FDA at 1-800-FDA-1088. ?Where should I keep my medication? ?This drug is given in a hospital or clinic and will not be stored at home. ?NOTE: This sheet is a summary. It may not cover all possible information. If you have questions about this medicine, talk to your doctor, pharmacist, or health care provider. ?? 2023 Elsevier/Gold Standard (2018-10-30 00:00:00) ? ?Zoledronic Acid Injection (Cancer) ?What is this medication? ?ZOLEDRONIC ACID (ZOE le dron ik AS id) treats high calcium levels in the blood caused by cancer. It may also be used with chemotherapy to treat weakened bones caused by cancer. It works by slowing down the  release of calcium from bones. This lowers calcium levels in your blood. It also makes your bones stronger and less likely to break (fracture). It belongs to a group of medications called bisphosphonates. ?This medicine may be used for other purposes; ask your health care provider or pharmacist if you have questions. ?COMMON BRAND NAME(S): Zometa, Zometa Powder ?What should I tell my care team before I take this medication? ?They need to know if you have any of these conditions: ?Dehydration ?Dental disease ?Kidney disease ?Liver disease ?Low levels of calcium in the blood ?Lung or breathing disease, such as asthma ?Receiving steroids, such as dexamethasone or prednisone ?An unusual or allergic reaction to zoledronic acid, other medications, foods, dyes, or preservatives ?Pregnant or trying to get pregnant ?Breast-feeding ?How should I use this medication? ?This medication is injected into a vein. It is given by your care team in a hospital or clinic setting. ?Talk to your care team about the use of this medication in children. Special care may be needed. ?Overdosage: If you think you have taken too much of this medicine contact a poison control center or emergency room at once. ?NOTE: This medicine is only for you. Do not share this medicine with others. ?What if I miss a dose? ?Keep appointments for follow-up doses. It is important not to miss your dose. Call your care team if you  are unable to keep an appointment. ?What may interact with this medication? ?Certain antibiotics given by injection ?Diuretics, such as bumetanide, furosemide ?NSAIDs, medications for pain and inflammation, such as ibuprofen or naproxen ?Teriparatide ?Thalidomide ?This list may not describe all possible interactions. Give your health care provider a list of all the medicines, herbs, non-prescription drugs, or dietary supplements you use. Also tell them if you smoke, drink alcohol, or use illegal drugs. Some items may interact with your  medicine. ?What should I watch for while using this medication? ?Visit your care team for regular checks on your progress. It may be some time before you see the benefit from this medication. ?Some people who take

## 2021-11-23 LAB — CANCER ANTIGEN 27.29: CA 27.29: 36.2 U/mL (ref 0.0–38.6)

## 2021-11-23 LAB — CANCER ANTIGEN 15-3: CA 15-3: 33.4 U/mL — ABNORMAL HIGH (ref 0.0–25.0)

## 2021-11-28 ENCOUNTER — Telehealth: Payer: Self-pay | Admitting: Oncology

## 2021-11-28 NOTE — Telephone Encounter (Signed)
Patient called to reschedule her October appointment. Since we changed that appointment, she needs to change her November appointment to be 28 days later for insurance to cover. Patient will call back tomorrow to reschedule the November appointment. Pamala Hurry ?

## 2021-11-29 ENCOUNTER — Telehealth: Payer: Self-pay | Admitting: Oncology

## 2021-11-29 NOTE — Telephone Encounter (Signed)
Telephone Note to r/s Nov appt, pt called in to r/s.Marland KitchenKJ

## 2021-12-24 ENCOUNTER — Inpatient Hospital Stay: Payer: 59 | Attending: Oncology

## 2021-12-24 DIAGNOSIS — Z79899 Other long term (current) drug therapy: Secondary | ICD-10-CM | POA: Diagnosis not present

## 2021-12-24 DIAGNOSIS — C50411 Malignant neoplasm of upper-outer quadrant of right female breast: Secondary | ICD-10-CM | POA: Insufficient documentation

## 2021-12-24 DIAGNOSIS — Z170 Estrogen receptor positive status [ER+]: Secondary | ICD-10-CM | POA: Insufficient documentation

## 2021-12-24 DIAGNOSIS — Z79811 Long term (current) use of aromatase inhibitors: Secondary | ICD-10-CM | POA: Insufficient documentation

## 2021-12-24 DIAGNOSIS — M85851 Other specified disorders of bone density and structure, right thigh: Secondary | ICD-10-CM | POA: Insufficient documentation

## 2021-12-24 MED ORDER — GOSERELIN ACETATE 3.6 MG ~~LOC~~ IMPL
3.6000 mg | DRUG_IMPLANT | Freq: Once | SUBCUTANEOUS | Status: AC
  Administered 2021-12-24: 3.6 mg via SUBCUTANEOUS
  Filled 2021-12-24: qty 3.6

## 2022-01-23 ENCOUNTER — Inpatient Hospital Stay: Payer: 59 | Attending: Oncology

## 2022-01-23 DIAGNOSIS — Z79899 Other long term (current) drug therapy: Secondary | ICD-10-CM | POA: Diagnosis not present

## 2022-01-23 DIAGNOSIS — C50411 Malignant neoplasm of upper-outer quadrant of right female breast: Secondary | ICD-10-CM | POA: Insufficient documentation

## 2022-01-23 DIAGNOSIS — Z170 Estrogen receptor positive status [ER+]: Secondary | ICD-10-CM | POA: Diagnosis not present

## 2022-01-23 MED ORDER — GOSERELIN ACETATE 3.6 MG ~~LOC~~ IMPL
3.6000 mg | DRUG_IMPLANT | Freq: Once | SUBCUTANEOUS | Status: AC
  Administered 2022-01-23: 3.6 mg via SUBCUTANEOUS
  Filled 2022-01-23: qty 3.6

## 2022-02-22 ENCOUNTER — Inpatient Hospital Stay: Payer: 59 | Attending: Oncology

## 2022-02-22 DIAGNOSIS — Z79899 Other long term (current) drug therapy: Secondary | ICD-10-CM | POA: Diagnosis not present

## 2022-02-22 DIAGNOSIS — Z170 Estrogen receptor positive status [ER+]: Secondary | ICD-10-CM | POA: Insufficient documentation

## 2022-02-22 DIAGNOSIS — C50411 Malignant neoplasm of upper-outer quadrant of right female breast: Secondary | ICD-10-CM | POA: Insufficient documentation

## 2022-02-22 MED ORDER — GOSERELIN ACETATE 3.6 MG ~~LOC~~ IMPL
3.6000 mg | DRUG_IMPLANT | Freq: Once | SUBCUTANEOUS | Status: AC
  Administered 2022-02-22: 3.6 mg via SUBCUTANEOUS
  Filled 2022-02-22: qty 3.6

## 2022-03-26 ENCOUNTER — Inpatient Hospital Stay: Payer: 59

## 2022-03-27 ENCOUNTER — Inpatient Hospital Stay: Payer: 59 | Attending: Oncology

## 2022-03-27 DIAGNOSIS — Z79899 Other long term (current) drug therapy: Secondary | ICD-10-CM | POA: Insufficient documentation

## 2022-03-27 DIAGNOSIS — C50411 Malignant neoplasm of upper-outer quadrant of right female breast: Secondary | ICD-10-CM | POA: Insufficient documentation

## 2022-03-27 DIAGNOSIS — Z170 Estrogen receptor positive status [ER+]: Secondary | ICD-10-CM | POA: Insufficient documentation

## 2022-03-27 MED ORDER — GOSERELIN ACETATE 3.6 MG ~~LOC~~ IMPL
3.6000 mg | DRUG_IMPLANT | Freq: Once | SUBCUTANEOUS | Status: AC
  Administered 2022-03-27: 3.6 mg via SUBCUTANEOUS
  Filled 2022-03-27: qty 3.6

## 2022-04-25 ENCOUNTER — Inpatient Hospital Stay: Payer: 59

## 2022-04-26 ENCOUNTER — Inpatient Hospital Stay: Payer: 59 | Attending: Oncology

## 2022-04-26 DIAGNOSIS — Z5111 Encounter for antineoplastic chemotherapy: Secondary | ICD-10-CM | POA: Diagnosis present

## 2022-04-26 DIAGNOSIS — Z170 Estrogen receptor positive status [ER+]: Secondary | ICD-10-CM | POA: Insufficient documentation

## 2022-04-26 DIAGNOSIS — C50411 Malignant neoplasm of upper-outer quadrant of right female breast: Secondary | ICD-10-CM | POA: Insufficient documentation

## 2022-04-26 MED ORDER — GOSERELIN ACETATE 3.6 MG ~~LOC~~ IMPL
3.6000 mg | DRUG_IMPLANT | Freq: Once | SUBCUTANEOUS | Status: AC
  Administered 2022-04-26: 3.6 mg via SUBCUTANEOUS
  Filled 2022-04-26: qty 3.6

## 2022-05-01 ENCOUNTER — Ambulatory Visit: Payer: 59 | Admitting: Radiation Oncology

## 2022-05-13 ENCOUNTER — Encounter: Payer: Self-pay | Admitting: Radiation Oncology

## 2022-05-13 ENCOUNTER — Ambulatory Visit
Admission: RE | Admit: 2022-05-13 | Discharge: 2022-05-13 | Disposition: A | Discharge status: Home / Self Care | Payer: 59 | Source: Ambulatory Visit | Attending: Radiation Oncology | Admitting: Radiation Oncology

## 2022-05-13 VITALS — BP 127/75 | HR 105 | Temp 98.0°F | Resp 16 | Ht 64.0 in | Wt 164.0 lb

## 2022-05-13 DIAGNOSIS — Z923 Personal history of irradiation: Secondary | ICD-10-CM | POA: Diagnosis not present

## 2022-05-13 DIAGNOSIS — Z79811 Long term (current) use of aromatase inhibitors: Secondary | ICD-10-CM | POA: Diagnosis not present

## 2022-05-13 DIAGNOSIS — Z170 Estrogen receptor positive status [ER+]: Secondary | ICD-10-CM | POA: Diagnosis not present

## 2022-05-13 DIAGNOSIS — C50411 Malignant neoplasm of upper-outer quadrant of right female breast: Secondary | ICD-10-CM | POA: Insufficient documentation

## 2022-05-13 NOTE — Progress Notes (Signed)
Radiation Oncology Follow up Note  Name: Anne Wu   Date:   05/13/2022 MRN:  [id number] DOB: 1981/04/30    This 41 y.o. female presents to the clinic today for 4-year follow-up.  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  Seen today in routine follow-up she is doing well.  She specifically denies breast tenderness cough or bone pain..  She had screening mammograms back in January which I have reviewed were BI-RADS 1 negative.  She is currently on Aromasin tolerating it well without side effect.  COMPLICATIONS OF TREATMENT: none  FOLLOW UP COMPLIANCE: keeps appointments   PHYSICAL EXAM:  BP 127/75 (BP Location: Left Arm, Patient Position: Sitting, Cuff Size: Normal)   Pulse (!) 105   Temp 98 F (36.7 C) (Tympanic)   Resp 16   Ht '5\' 4"'$  (1.626 m)   Wt 164 lb (74.4 kg)   LMP 09/12/2017 (Approximate)   BMI 28.15 kg/m  Lungs are clear to A&P cardiac examination essentially unremarkable with regular rate and rhythm. No dominant mass or nodularity is noted in either breast in 2 positions examined. Incision is well-healed. No axillary or supraclavicular adenopathy is appreciated. Cosmetic result is excellent.  Well-developed well-nourished patient in NAD. HEENT reveals PERLA, EOMI, discs not visualized.  Oral cavity is clear. No oral mucosal lesions are identified. Neck is clear without evidence of cervical or supraclavicular adenopathy. Lungs are clear to A&P. Cardiac examination is essentially unremarkable with regular rate and rhythm without murmur rub or thrill. Abdomen is benign with no organomegaly or masses noted. Motor sensory and DTR levels are equal and symmetric in the upper and lower extremities.  Cranial nerves II through XII are grossly intact. Proprioception is intact. No peripheral adenopathy or edema is identified. No motor or sensory levels are noted. Crude visual fields are within normal range.  RADIOLOGY RESULTS: As reviewed compatible with above-stated findings  PLAN: The present time patient continues to do well now out over 4 years with no evidence of disease.  I Georgina Peer turn follow-up care over to medical oncology.  Be happy to reevaluate the patient anytime should that be indicated.  Patient is to call with any concerns.  I would like to take this opportunity to thank you for allowing me to participate in the care of your patient.[name], MD

## 2022-05-21 ENCOUNTER — Other Ambulatory Visit: Payer: Self-pay | Admitting: General Surgery

## 2022-05-21 DIAGNOSIS — Z1231 Encounter for screening mammogram for malignant neoplasm of breast: Secondary | ICD-10-CM

## 2022-05-23 ENCOUNTER — Ambulatory Visit: Payer: 59

## 2022-05-23 ENCOUNTER — Ambulatory Visit: Payer: 59 | Admitting: Oncology

## 2022-05-23 ENCOUNTER — Other Ambulatory Visit: Payer: 59

## 2022-05-27 ENCOUNTER — Encounter: Payer: Self-pay | Admitting: Oncology

## 2022-05-27 ENCOUNTER — Inpatient Hospital Stay: Payer: 59 | Attending: Oncology

## 2022-05-27 ENCOUNTER — Inpatient Hospital Stay (HOSPITAL_BASED_OUTPATIENT_CLINIC_OR_DEPARTMENT_OTHER): Payer: 59 | Admitting: Oncology

## 2022-05-27 ENCOUNTER — Inpatient Hospital Stay: Payer: 59

## 2022-05-27 VITALS — BP 122/73 | HR 82 | Temp 98.3°F | Resp 18 | Wt 162.5 lb

## 2022-05-27 DIAGNOSIS — C50411 Malignant neoplasm of upper-outer quadrant of right female breast: Secondary | ICD-10-CM

## 2022-05-27 DIAGNOSIS — Z170 Estrogen receptor positive status [ER+]: Secondary | ICD-10-CM | POA: Insufficient documentation

## 2022-05-27 DIAGNOSIS — M8580 Other specified disorders of bone density and structure, unspecified site: Secondary | ICD-10-CM | POA: Insufficient documentation

## 2022-05-27 DIAGNOSIS — Z79811 Long term (current) use of aromatase inhibitors: Secondary | ICD-10-CM

## 2022-05-27 DIAGNOSIS — M85851 Other specified disorders of bone density and structure, right thigh: Secondary | ICD-10-CM | POA: Diagnosis not present

## 2022-05-27 DIAGNOSIS — Z5111 Encounter for antineoplastic chemotherapy: Secondary | ICD-10-CM | POA: Diagnosis present

## 2022-05-27 LAB — CBC WITH DIFFERENTIAL/PLATELET
Abs Immature Granulocytes: 0.01 K/uL (ref 0.00–0.07)
Basophils Absolute: 0 K/uL (ref 0.0–0.1)
Basophils Relative: 1 %
Eosinophils Absolute: 0 K/uL (ref 0.0–0.5)
Eosinophils Relative: 1 %
HCT: 41 % (ref 36.0–46.0)
Hemoglobin: 13.7 g/dL (ref 12.0–15.0)
Immature Granulocytes: 0 %
Lymphocytes Relative: 29 %
Lymphs Abs: 1.7 K/uL (ref 0.7–4.0)
MCH: 29.4 pg (ref 26.0–34.0)
MCHC: 33.4 g/dL (ref 30.0–36.0)
MCV: 88 fL (ref 80.0–100.0)
Monocytes Absolute: 0.3 K/uL (ref 0.1–1.0)
Monocytes Relative: 5 %
Neutro Abs: 3.8 K/uL (ref 1.7–7.7)
Neutrophils Relative %: 64 %
Platelets: 261 K/uL (ref 150–400)
RBC: 4.66 MIL/uL (ref 3.87–5.11)
RDW: 11.2 % — ABNORMAL LOW (ref 11.5–15.5)
WBC: 5.8 K/uL (ref 4.0–10.5)
nRBC: 0 % (ref 0.0–0.2)

## 2022-05-27 LAB — COMPREHENSIVE METABOLIC PANEL WITH GFR
ALT: 14 U/L (ref 0–44)
AST: 20 U/L (ref 15–41)
Albumin: 4.2 g/dL (ref 3.5–5.0)
Alkaline Phosphatase: 56 U/L (ref 38–126)
Anion gap: 9 (ref 5–15)
BUN: 16 mg/dL (ref 6–20)
CO2: 29 mmol/L (ref 22–32)
Calcium: 9.3 mg/dL (ref 8.9–10.3)
Chloride: 98 mmol/L (ref 98–111)
Creatinine, Ser: 0.49 mg/dL (ref 0.44–1.00)
GFR, Estimated: >60 mL/min
Glucose, Bld: 135 mg/dL — ABNORMAL HIGH (ref 70–99)
Potassium: 4 mmol/L (ref 3.5–5.1)
Sodium: 136 mmol/L (ref 135–145)
Total Bilirubin: 0.4 mg/dL (ref 0.3–1.2)
Total Protein: 7.6 g/dL (ref 6.5–8.1)

## 2022-05-27 MED ORDER — SODIUM CHLORIDE 0.9 % IV SOLN
Freq: Once | INTRAVENOUS | Status: AC
  Filled 2022-05-27: qty 250

## 2022-05-27 MED ORDER — ZOLEDRONIC ACID 4 MG/100ML IV SOLN
4.0000 mg | Freq: Once | INTRAVENOUS | Status: AC
  Administered 2022-05-27: 4 mg via INTRAVENOUS
  Filled 2022-05-27: qty 100

## 2022-05-27 MED ORDER — GOSERELIN ACETATE 10.8 MG ~~LOC~~ IMPL: 10.8000 mg | DRUG_IMPLANT | Freq: Once | SUBCUTANEOUS | Status: DC

## 2022-05-27 MED ORDER — GOSERELIN ACETATE 3.6 MG ~~LOC~~ IMPL
3.6000 mg | DRUG_IMPLANT | Freq: Once | SUBCUTANEOUS | Status: AC
  Administered 2022-05-27: 3.6 mg via SUBCUTANEOUS
  Filled 2022-05-27: qty 3.6

## 2022-05-27 NOTE — Assessment & Plan Note (Addendum)
#  ypT2 ypN0 (i+) (sn), grade 2 breast cancer, ER/PR positive, HER 2 negative. Labs reviewed and discussed with patient. CA 15.3, CA 27.29 levels have been monitored.  Continue ovarian suppresion with Zoladex, if insurance approves, switch to Zoladex LA Q 3 months.  Continue Aromasin.  Annual mammogram - ordered by Dr.Cintron.

## 2022-05-27 NOTE — Assessment & Plan Note (Addendum)
#  Chronic aromatase inhibitor use,  01/16/2021, DEXA showed Osteopenia, right femoral neck T- 1.2, stable.  Plan to repeat DEXA in 2024 Proceed with Zometa today. Recommend patient to continue calcium and vitamin D supplementation.

## 2022-05-27 NOTE — Progress Notes (Signed)
Pt here for follow up. No new concerns voiced. No new breast problems  

## 2022-05-27 NOTE — Progress Notes (Signed)
Hematology/Oncology Progress note [phone] [phone]      Patient Care Team: [name], MD as PCP - General (Oncology) Theodore Demark, RN (Inactive) as Oncology Nurse Navigator [name], [name], MD (Inactive) as Referring Physician (Surgery) [name], MD as Consulting Physician (Oncology) [name], MD as Consulting Physician (General Surgery) [name], MD as Referring Physician (Radiation Oncology)   ASSESSMENT & PLAN:   Cancer Staging  Malignant neoplasm of upper-outer quadrant of right breast in female, estrogen receptor positive [facility]) Staging form: Breast, AJCC 8th Edition - Clinical stage from 07/15/2017: Stage IB (cT2, cN0, cM0, G2, ER+, PR+, HER2-) - Signed by [name], MD on 07/15/2017   Malignant neoplasm of upper-outer quadrant of right breast in female, estrogen receptor positive ([place]) #ypT2 ypN0 (i+) (sn), grade 2 breast cancer, ER/PR positive, HER 2 negative. Labs reviewed and discussed with patient. CA 15.3, CA 27.29 levels have been monitored.  Continue ovarian suppresion with Zoladex, if insurance approves, switch to Zoladex LA Q 3 months.  Continue Aromasin.  Annual mammogram - ordered by Dr.Cintron.   Aromatase inhibitor use #Chronic aromatase inhibitor use,  01/16/2021, DEXA showed osteopenia, right femoral neck T- 1.2, stable.  Plan to repeat DEXA in 2024 Proceed with Zometa today. Recommend patient to continue calcium and vitamin D supplementation.   Orders Placed This Encounter  Procedures   CBC with Differential/Platelet    Standing Status:   Future    Standing Expiration Date:   05/28/2023   Comprehensive metabolic panel    Standing Status:   Future    Standing Expiration Date:   05/27/2023   Follow up in 6 months.  All questions were answered. The patient knows to call the clinic with any problems, questions or concerns.  [name], MD, PhD [facility] Hematology Oncology 05/27/2022     REASON FOR  VISIT Follow up for management of breast cancer  HISTORY OF PRESENTING ILLNESS:  Anne Wu is a  41 y.o.  female with diagnosis of cT2N0M0, grade 2 invasive mammary carcinoma.  She has significant family history on both maternal side as well as paternal side. She felt a right breast mass 2 months ago and diagnostic mammogram showed right upper quandrant 2.2 cm mass, which was confirmed on [place] and biopsied. Right axillary no clinically suspicious lymph node. Pathology showed invasive mammary carcinoma, ER/PR positive,, HER2 IHC  equivocal, FISH negative.   # Patient was evaluated by Dr.Smith. From surgical standpoint, Dr.Smith recommend neoadjuvant chemotherapy to facilitate surgery to obtain negative margin and better surgical outcome. Also given patient's young age, neoadjuvant chemotherapy is reasonable.    #Patient stated that she does not desire future fertility.  # 2D Echo showed normal systolic function, mild mitral regurgitation.  # CT was done  to rule out metastatic disease given micrometastasis. Images were independently reviewed by me and discussed with patient.  Negative for metastatic disease. # Testing did not reveal any pathogenic mutation in any of these genes.  A copy of the genetic test report will be scanned into Epic under the Media tab.The genes analyzed were the 23 genes on [facility]'s Breast/GYN panel (ATM, BARD1, BRCA1, BRCA2, BRIP1, CDH1, CHEK2, DICER1, EPCAM, MLH1,  MSH2, MSH6, NBN, NF1, PALB2, PMS2, PTEN, RAD50, RAD51C, RAD51D,SMARCA4, STK11, and TP53). 09/08/2017 Interval mammogram after 4 cycle of AC, showed good treatment response. The biopsy-proven malignancy in the upper outer quadrant of the right breast at posterior depth, associated with scattered microcalcifications and architectural distortion, has significantly decreased in  size since the mammogram 06/20/2017. On today's mammogram, the mass measures approximately 1.7 x 2.2 x 1.2 cm (previously 2.4 x 1.6 x 2.7 cm).    Treatment Summary :  Jan 2019- May 2019 Neoadjuvant AC-->T  12/29/2017 Right lumpectomy and sentinel LN biopsy Pathology showed ypT2 ypN0 (i+) (sn) case was discussed on tumor board.ER/PR positive, HER 2 negative.  Aug 2019-September 2019 Adjuvant RT  Oct 2019 to start Zoladex and Aromasin. Mediport was removed.  08/13/2021, bilateral screening mammogram showed no findings suspicious for malignancy  INTERVAL HISTORY Anne Wu is a 41 y.o. female who has above history reviewed by me presents for follow up for breast cancer management  Patient is on ovarian suppression with Zoladex monthly.   Patient takes Aromasin.  Manageable side effects.+ hot flash. She is on ovarian suppression with Zoladex injections    Review of Systems  Constitutional:  Negative for appetite change, chills, fatigue and fever.  HENT:   Negative for hearing loss and voice change.   Eyes:  Negative for eye problems.  Respiratory:  Negative for chest tightness and cough.   Cardiovascular:  Negative for chest pain.  Gastrointestinal:  Negative for abdominal distention, abdominal pain and blood in stool.  Endocrine: Positive for hot flashes.  Genitourinary:  Negative for difficulty urinating and frequency.   Musculoskeletal:  Positive for arthralgias.  Skin:  Negative for itching and rash.  Neurological:  Negative for dizziness, extremity weakness and light-headedness.  Hematological:  Negative for adenopathy.  Psychiatric/Behavioral:  Negative for confusion. The patient is not nervous/anxious.     MEDICAL HISTORY:  Past Medical History:  Diagnosis Date   Asthma    AS A CHILD-NO INHALERS   Breast cancer ([place]) 2018   Right breast cancer-[facility]   Elevated blood pressure, situational    PT STATES HER LAST COUPLE OF MD APPOINTMENTS SHE HAS HAD ELEVATED BP-NEVER HAD A PROBLEM WITH THIS PREVIOUSLY   Genetic testing 09/05/2017   Breast/GYN panel (23 genes) @ [facility] - No pathogenic mutations detected   GERD  (gastroesophageal reflux disease)    OCC- NO MEDS   Malignant neoplasm of upper-outer quadrant of right breast in female, estrogen receptor positive ([place]) 2019   [facility]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   Personal history of chemotherapy    Personal history of radiation therapy     SURGICAL HISTORY: Past Surgical History:  Procedure Laterality Date   BREAST BIOPSY Right 06/20/2017   Invasive Mammary Carcinoma   BREAST EXCISIONAL BIOPSY Right 12/29/2017   RESIDUAL INVASIVE MAMMARY CARCINOMA and DCIS   BREAST LUMPECTOMY Right 12/29/2017   needle localization and sentinel node injection   NO PAST SURGERIES     PARTIAL MASTECTOMY WITH NEEDLE LOCALIZATION Right 12/29/2017   Procedure: PARTIAL MASTECTOMY WITH NEEDLE LOCALIZATION;  Surgeon: [name], MD;  Location: [facility] ORS;  Service: General;  Laterality: Right;   PORTACATH PLACEMENT N/A 07/11/2017   Procedure: INSERTION PORT-A-CATH;  Surgeon: Leonie Green, MD;  Location: [facility] ORS;  Service: General;  Laterality: N/A;   SENTINEL NODE BIOPSY Right 12/29/2017   Procedure: SENTINEL NODE BIOPSY;  Surgeon: [name], MD;  Location: [facility] ORS;  Service: General;  Laterality: Right;    SOCIAL HISTORY: Social History   Socioeconomic History   Marital status: Married    Spouse name: Not on file   Number of children: Not on file   Years of education: Not on file   Highest education level: Not on file  Occupational History   Not on file  Tobacco  Use   Smoking status: Never   Smokeless tobacco: Never  Vaping Use   Vaping Use: Never used  Substance and Sexual Activity   Alcohol use: No    Alcohol/week: 0.0 standard drinks of alcohol   Drug use: No   Sexual activity: Yes  Other Topics Concern   Not on file  Social History Narrative   Not on file   Social Determinants of Health   Financial Resource Strain: Not on file  Food Insecurity: Not on file  Transportation Needs: Not on file  Physical Activity: Not on file  Stress: Not  on file  Social Connections: Not on file  Intimate Partner Violence: Not on file    FAMILY HISTORY: Family History  Problem Relation Age of Onset   Breast cancer Maternal Aunt 70       currently late 25s   Breast cancer Maternal Grandmother 68       second breast vs. recurrence ca at 81; deceased at 24   Colon cancer Maternal Uncle 91       deceased in 61s   Breast cancer Paternal Aunt        age at dx unknown; currently in 84s   Breast cancer Paternal Grandmother        age at dx unknown   Melanoma Mother        on leg   Testicular cancer Maternal Uncle 57       currently 69s    ALLERGIES:  is allergic to codeine.  MEDICATIONS:  Current Outpatient Medications  Medication Sig Dispense Refill   acetaminophen (TYLENOL) 325 MG tablet Take 650 mg by mouth every 6 (six) hours as needed for moderate pain or headache.     Calcium Carbonate-Vit D-Min (CALCIUM 1200 PO) Take 1 tablet by mouth daily.     cholecalciferol (VITAMIN D) 1000 units tablet Take 1,000 Units by mouth daily.     ELDERBERRY PO Take 1 tablet by mouth daily.     exemestane (AROMASIN) 25 MG tablet Take 1 tablet (25 mg total) by mouth daily after breakfast. 90 tablet 1   loratadine (CLARITIN) 10 MG tablet Take 10 mg by mouth daily.     Multiple Vitamin (MULTI-VITAMINS) TABS Take 1 tablet by mouth daily.      No current facility-administered medications for this visit.   Facility-Administered Medications Ordered in Other Visits  Medication Dose Route Frequency Provider Last Rate Last Admin   heparin lock flush 100 unit/mL  500 Units Intravenous Once [name], MD       sodium chloride flush (NS) 0.9 % injection 10 mL  10 mL Intravenous PRN [name], MD   10 mL at 08/01/17 0829     PHYSICAL EXAMINATION: ECOG PERFORMANCE STATUS: 1 - Symptomatic but completely ambulatory Vitals:   05/27/22 1339  BP: 122/73  Pulse: 82  Resp: 18  Temp: 98.3 F (36.8 C)   Filed Weights   05/27/22 1339  Weight: 162 lb 8 oz  (73.7 kg)    Physical Exam Constitutional:      General: She is not in acute distress. HENT:     Mouth/Throat:     Pharynx: No oropharyngeal exudate.  Eyes:     General: No scleral icterus. Neck:     Vascular: No JVD.  Cardiovascular:     Rate and Rhythm: Normal rate and regular rhythm.     Heart sounds: Normal heart sounds. No murmur heard.    No friction rub.  Pulmonary:  Effort: Pulmonary effort is normal. No respiratory distress.     Breath sounds: Normal breath sounds. No rales.  Chest:     Chest wall: No tenderness.  Abdominal:     General: Bowel sounds are normal. There is no distension.     Palpations: Abdomen is soft.     Tenderness: There is no abdominal tenderness.  Musculoskeletal:        General: No tenderness or deformity. Normal range of motion.     Cervical back: Normal range of motion and neck supple.  Lymphadenopathy:     Cervical: No cervical adenopathy.  Skin:    General: Skin is warm and dry.     Findings: No erythema.  Neurological:     Mental Status: She is alert and oriented to person, place, and time.     Cranial Nerves: No cranial nerve deficit.     Motor: No abnormal muscle tone.     Coordination: Coordination normal.  Psychiatric:        Mood and Affect: Mood and affect normal.    Breast exam was performed in seated position. Patient is status post right breast lumpectomy with a well-healed surgical scar. No evidence of any right palpable masses. No palpable right axillary adenopathy. No palpable masses or lumps in the left breast. No evidence of left axillary adenopathy   RADIOGRAPHIC STUDIES: I have personally reviewed the radiological images as listed and agreed with the findings in the report. No results found.

## 2022-05-28 LAB — CANCER ANTIGEN 15-3: CA 15-3: 33.7 U/mL — ABNORMAL HIGH (ref 0.0–25.0)

## 2022-05-28 LAB — CANCER ANTIGEN 27.29: CA 27.29: 35.8 U/mL (ref 0.0–38.6)

## 2022-06-02 ENCOUNTER — Other Ambulatory Visit: Payer: Self-pay | Admitting: Oncology

## 2022-06-24 ENCOUNTER — Inpatient Hospital Stay: Payer: 59

## 2022-06-24 ENCOUNTER — Ambulatory Visit: Payer: 59

## 2022-06-24 MED ORDER — GOSERELIN ACETATE 10.8 MG ~~LOC~~ IMPL
10.8000 mg | DRUG_IMPLANT | Freq: Once | SUBCUTANEOUS | Status: DC
  Filled 2022-06-24: qty 10.8

## 2022-06-26 ENCOUNTER — Inpatient Hospital Stay: Payer: 59 | Attending: Oncology

## 2022-06-26 DIAGNOSIS — Z5111 Encounter for antineoplastic chemotherapy: Secondary | ICD-10-CM | POA: Insufficient documentation

## 2022-06-26 DIAGNOSIS — C50411 Malignant neoplasm of upper-outer quadrant of right female breast: Secondary | ICD-10-CM | POA: Diagnosis present

## 2022-06-26 DIAGNOSIS — M8580 Other specified disorders of bone density and structure, unspecified site: Secondary | ICD-10-CM | POA: Diagnosis not present

## 2022-06-26 DIAGNOSIS — Z170 Estrogen receptor positive status [ER+]: Secondary | ICD-10-CM | POA: Insufficient documentation

## 2022-06-26 MED ORDER — GOSERELIN ACETATE 10.8 MG ~~LOC~~ IMPL
10.8000 mg | DRUG_IMPLANT | Freq: Once | SUBCUTANEOUS | Status: AC
  Administered 2022-06-26: 10.8 mg via SUBCUTANEOUS
  Filled 2022-06-26: qty 10.8

## 2022-08-12 ENCOUNTER — Ambulatory Visit
Admission: RE | Admit: 2022-08-12 | Discharge: 2022-08-12 | Disposition: A | Discharge status: Home / Self Care | Payer: 59 | Source: Ambulatory Visit | Attending: General Surgery | Admitting: General Surgery

## 2022-08-12 DIAGNOSIS — Z1231 Encounter for screening mammogram for malignant neoplasm of breast: Secondary | ICD-10-CM

## 2022-09-16 ENCOUNTER — Inpatient Hospital Stay: Payer: 59 | Attending: Oncology

## 2022-09-16 ENCOUNTER — Ambulatory Visit: Payer: 59

## 2022-09-16 DIAGNOSIS — Z79899 Other long term (current) drug therapy: Secondary | ICD-10-CM | POA: Insufficient documentation

## 2022-09-16 DIAGNOSIS — C50411 Malignant neoplasm of upper-outer quadrant of right female breast: Secondary | ICD-10-CM | POA: Insufficient documentation

## 2022-09-16 DIAGNOSIS — Z170 Estrogen receptor positive status [ER+]: Secondary | ICD-10-CM | POA: Diagnosis not present

## 2022-09-16 MED ORDER — GOSERELIN ACETATE 10.8 MG ~~LOC~~ IMPL
10.8000 mg | DRUG_IMPLANT | Freq: Once | SUBCUTANEOUS | Status: AC
  Administered 2022-09-16: 10.8 mg via SUBCUTANEOUS
  Filled 2022-09-16: qty 10.8

## 2022-12-10 ENCOUNTER — Encounter: Payer: Self-pay | Admitting: Oncology

## 2022-12-10 ENCOUNTER — Inpatient Hospital Stay (HOSPITAL_BASED_OUTPATIENT_CLINIC_OR_DEPARTMENT_OTHER): Payer: 59 | Admitting: Oncology

## 2022-12-10 ENCOUNTER — Inpatient Hospital Stay: Payer: 59

## 2022-12-10 ENCOUNTER — Inpatient Hospital Stay: Payer: 59 | Attending: Oncology

## 2022-12-10 VITALS — BP 121/78 | HR 93 | Temp 97.9°F | Resp 18 | Wt 173.9 lb

## 2022-12-10 DIAGNOSIS — Z79811 Long term (current) use of aromatase inhibitors: Secondary | ICD-10-CM | POA: Diagnosis not present

## 2022-12-10 DIAGNOSIS — C50411 Malignant neoplasm of upper-outer quadrant of right female breast: Secondary | ICD-10-CM

## 2022-12-10 DIAGNOSIS — Z170 Estrogen receptor positive status [ER+]: Secondary | ICD-10-CM | POA: Diagnosis not present

## 2022-12-10 DIAGNOSIS — M85851 Other specified disorders of bone density and structure, right thigh: Secondary | ICD-10-CM | POA: Insufficient documentation

## 2022-12-10 LAB — COMPREHENSIVE METABOLIC PANEL
ALT: 15 U/L (ref 0–44)
AST: 22 U/L (ref 15–41)
Albumin: 4.2 g/dL (ref 3.5–5.0)
Alkaline Phosphatase: 57 U/L (ref 38–126)
Anion gap: 10 (ref 5–15)
BUN: 17 mg/dL (ref 6–20)
CO2: 28 mmol/L (ref 22–32)
Calcium: 9.3 mg/dL (ref 8.9–10.3)
Chloride: 99 mmol/L (ref 98–111)
Creatinine, Ser: 0.64 mg/dL (ref 0.44–1.00)
GFR, Estimated: >60 mL/min (ref >60)
Glucose, Bld: 108 mg/dL — ABNORMAL HIGH (ref 70–99)
Potassium: 4.4 mmol/L (ref 3.5–5.1)
Sodium: 137 mmol/L (ref 135–145)
Total Bilirubin: 0.3 mg/dL (ref 0.3–1.2)
Total Protein: 7.5 g/dL (ref 6.5–8.1)

## 2022-12-10 LAB — CBC WITH DIFFERENTIAL/PLATELET
Abs Immature Granulocytes: 0.01 10*3/uL (ref 0.00–0.07)
Basophils Absolute: 0 10*3/uL (ref 0.0–0.1)
Basophils Relative: 1 %
Eosinophils Absolute: 0.1 10*3/uL (ref 0.0–0.5)
Eosinophils Relative: 2 %
HCT: 39.4 % (ref 36.0–46.0)
Hemoglobin: 13.2 g/dL (ref 12.0–15.0)
Immature Granulocytes: 0 %
Lymphocytes Relative: 27 %
Lymphs Abs: 1.4 10*3/uL (ref 0.7–4.0)
MCH: 29.5 pg (ref 26.0–34.0)
MCHC: 33.5 g/dL (ref 30.0–36.0)
MCV: 88.1 fL (ref 80.0–100.0)
Monocytes Absolute: 0.4 10*3/uL (ref 0.1–1.0)
Monocytes Relative: 7 %
Neutro Abs: 3.4 10*3/uL (ref 1.7–7.7)
Neutrophils Relative %: 63 %
Platelets: 255 10*3/uL (ref 150–400)
RBC: 4.47 MIL/uL (ref 3.87–5.11)
RDW: 11 % — ABNORMAL LOW (ref 11.5–15.5)
WBC: 5.3 10*3/uL (ref 4.0–10.5)
nRBC: 0 % (ref 0.0–0.2)

## 2022-12-10 MED ORDER — SODIUM CHLORIDE 0.9 % IV SOLN
Freq: Once | INTRAVENOUS | Status: AC
  Filled 2022-12-10: qty 250

## 2022-12-10 MED ORDER — ZOLEDRONIC ACID 4 MG/100ML IV SOLN
4.0000 mg | Freq: Once | INTRAVENOUS | Status: AC
  Administered 2022-12-10: 4 mg via INTRAVENOUS
  Filled 2022-12-10: qty 100

## 2022-12-10 MED ORDER — EXEMESTANE 25 MG PO TABS: 25.0000 mg | ORAL_TABLET | Freq: Every day | ORAL | 1 refills | Status: DC

## 2022-12-10 MED ORDER — GOSERELIN ACETATE 10.8 MG ~~LOC~~ IMPL
10.8000 mg | DRUG_IMPLANT | Freq: Once | SUBCUTANEOUS | Status: AC
  Administered 2022-12-10: 10.8 mg via SUBCUTANEOUS
  Filled 2022-12-10: qty 10.8

## 2022-12-10 NOTE — Assessment & Plan Note (Addendum)
#  ypT2 ypN0 (i+) (sn), grade 2 breast cancer, ER/PR positive, HER 2 negative. Labs reviewed and discussed with patient. CA 15.3, CA 27.29 levels have been monitored.  Zoladex LA Q 3 months.  Continue Aromasin 25mg  daily.  Annual mammogram - ordered by Dr.Cintron. -results were reviewed.

## 2022-12-10 NOTE — Progress Notes (Signed)
Hematology/Oncology Progress note [phone] [phone]      Patient Care Team: Rickard Patience, MD as PCP - General (Oncology) Scarlett Presto, RN (Inactive) as Oncology Nurse Navigator Katrinka Blazing, Elaina Pattee, MD (Inactive) as Referring Physician (Surgery) Rickard Patience, MD as Consulting Physician (Oncology) Carolan Shiver, MD as Consulting Physician (General Surgery) Carmina Miller, MD as Referring Physician (Radiation Oncology)   REASON FOR VISIT Follow up for management of breast cancer  ASSESSMENT & PLAN:   Cancer Staging  Malignant neoplasm of upper-outer quadrant of right breast in female, estrogen receptor positive (HCC) Staging form: Breast, AJCC 8th Edition - Clinical stage from 07/15/2017: Stage IB (cT2, cN0, cM0, G2, ER+, PR+, HER2-) - Signed by Rickard Patience, MD on 07/15/2017   Malignant neoplasm of upper-outer quadrant of right breast in female, estrogen receptor positive (HCC) #ypT2 ypN0 (i+) (sn), grade 2 breast cancer, ER/PR positive, HER 2 negative. Labs reviewed and discussed with patient. CA 15.3, CA 27.29 levels have been monitored.  Zoladex LA Q 3 months.  Continue Aromasin 25mg  daily.  Annual mammogram - ordered by Dr.Cintron. -results were reviewed.   Aromatase inhibitor use #Chronic aromatase inhibitor use,  01/16/2021, DEXA showed Osteopenia, right femoral neck T- 1.2, stable.  Plan to repeat DEXA in 2024 Proceed with Zometa today. Recommend patient to continue calcium and vitamin D supplementation.   Orders Placed This Encounter  Procedures   DG Bone Density    Standing Status:   Future    Standing Expiration Date:   12/10/2023    Order Specific Question:   Reason for Exam (SYMPTOM  OR DIAGNOSIS REQUIRED)    Answer:   Breast cancer    Order Specific Question:   Is patient pregnant?    Answer:   No    Order Specific Question:   Preferred imaging location?    Answer:   [facility]   CBC with Differential (Cancer Center Only)     Standing Status:   Future    Standing Expiration Date:   12/10/2023   CMP (Cancer Center only)    Standing Status:   Future    Standing Expiration Date:   12/10/2023   Cancer antigen 27.29    Standing Status:   Future    Standing Expiration Date:   12/10/2023   Cancer antigen 15-3    Standing Status:   Future    Standing Expiration Date:   12/10/2023   Follow up in 6 months.  All questions were answered. The patient knows to call the clinic with any problems, questions or concerns.  Rickard Patience, MD, PhD [facility] Hematology Oncology 12/10/2022     HISTORY OF PRESENTING ILLNESS:  Anne Wu is a  42 y.o.  female with diagnosis of cT2N0M0, grade 2 invasive mammary carcinoma.  She has significant family history on both maternal side as well as paternal side. She felt a right breast mass 2 months ago and diagnostic mammogram showed right upper quandrant 2.2 cm mass, which was confirmed on [place] and biopsied. Right axillary no clinically suspicious lymph node. Pathology showed invasive mammary carcinoma, ER/PR positive,, HER2 IHC  equivocal, FISH negative.   # Patient was evaluated by Dr.Smith. From surgical standpoint, Dr.Smith recommend neoadjuvant chemotherapy to facilitate surgery to obtain negative margin and better surgical outcome. Also given patient's young age, neoadjuvant chemotherapy is reasonable.    #Patient stated that she does not desire future fertility.  # 2D Echo showed normal systolic function, mild mitral regurgitation.  # CT was  done  to rule out metastatic disease given micrometastasis. Images were independently reviewed by me and discussed with patient.  Negative for metastatic disease. # Testing did not reveal any pathogenic mutation in any of these genes.  A copy of the genetic test report will be scanned into Epic under the Media tab.The genes analyzed were the 23 genes on [facility]'s Breast/GYN panel (ATM, BARD1, BRCA1, BRCA2, BRIP1, CDH1, CHEK2, DICER1, EPCAM, MLH1,  MSH2,  MSH6, NBN, NF1, PALB2, PMS2, PTEN, RAD50, RAD51C, RAD51D,SMARCA4, STK11, and TP53). 09/08/2017 Interval mammogram after 4 cycle of AC, showed good treatment response. The biopsy-proven malignancy in the upper outer quadrant of the right breast at posterior depth, associated with scattered microcalcifications and architectural distortion, has significantly decreased in size since the mammogram 06/20/2017. On today's mammogram, the mass measures approximately 1.7 x 2.2 x 1.2 cm (previously 2.4 x 1.6 x 2.7 cm).   Treatment Summary :  Jan 2019- May 2019 Neoadjuvant AC-->T  12/29/2017 Right lumpectomy and sentinel LN biopsy Pathology showed ypT2 ypN0 (i+) (sn) case was discussed on tumor board.ER/PR positive, HER 2 negative.  Aug 2019-September 2019 Adjuvant RT  Oct 2019 to start Zoladex and Aromasin. Mediport was removed.  08/13/2021, bilateral screening mammogram showed no findings suspicious for malignancy  INTERVAL HISTORY Anne Wu is a 42 y.o. female who has above history reviewed by me presents for follow up for breast cancer management  Patient is on ovarian suppression with Zoladex monthly.   Patient takes Aromasin.  Manageable side effects.+ hot flash. No new breast concerns.    Review of Systems  Constitutional:  Negative for appetite change, chills, fatigue and fever.  HENT:   Negative for hearing loss and voice change.   Eyes:  Negative for eye problems.  Respiratory:  Negative for chest tightness and cough.   Cardiovascular:  Negative for chest pain.  Gastrointestinal:  Negative for abdominal distention, abdominal pain and blood in stool.  Endocrine: Positive for hot flashes.  Genitourinary:  Negative for difficulty urinating and frequency.   Musculoskeletal:  Positive for arthralgias.  Skin:  Negative for itching and rash.  Neurological:  Negative for dizziness, extremity weakness and light-headedness.  Hematological:  Negative for adenopathy.  Psychiatric/Behavioral:   Negative for confusion. The patient is not nervous/anxious.     MEDICAL HISTORY:  Past Medical History:  Diagnosis Date   Asthma    AS A CHILD-NO INHALERS   Breast cancer (HCC) 2018   Right breast cancer-[facility]   Elevated blood pressure, situational    PT STATES HER LAST COUPLE OF MD APPOINTMENTS SHE HAS HAD ELEVATED BP-NEVER HAD A PROBLEM WITH THIS PREVIOUSLY   Genetic testing 09/05/2017   Breast/GYN panel (23 genes) @ [facility] - No pathogenic mutations detected   GERD (gastroesophageal reflux disease)    OCC- NO MEDS   Malignant neoplasm of upper-outer quadrant of right breast in female, estrogen receptor positive (HCC) 2019   [facility] and DCIS   Personal history of chemotherapy    Personal history of radiation therapy     SURGICAL HISTORY: Past Surgical History:  Procedure Laterality Date   BREAST BIOPSY Right 06/20/2017   Invasive Mammary Carcinoma   BREAST EXCISIONAL BIOPSY Right 12/29/2017   RESIDUAL INVASIVE MAMMARY CARCINOMA and DCIS   BREAST LUMPECTOMY Right 12/29/2017   needle localization and sentinel node injection   NO PAST SURGERIES     PARTIAL MASTECTOMY WITH NEEDLE LOCALIZATION Right 12/29/2017   Procedure: PARTIAL MASTECTOMY WITH NEEDLE LOCALIZATION;  Surgeon: Carolan Shiver, MD;  Location: [facility] ORS;  Service: General;  Laterality: Right;   PORTACATH PLACEMENT N/A 07/11/2017   Procedure: INSERTION PORT-A-CATH;  Surgeon: Nadeen Landau, MD;  Location: [facility] ORS;  Service: General;  Laterality: N/A;   SENTINEL NODE BIOPSY Right 12/29/2017   Procedure: SENTINEL NODE BIOPSY;  Surgeon: Carolan Shiver, MD;  Location: [facility] ORS;  Service: General;  Laterality: Right;    SOCIAL HISTORY: Social History   Socioeconomic History   Marital status: Married    Spouse name: Not on file   Number of children: Not on file   Years of education: Not on file   Highest education level: Not on file  Occupational History   Not on file  Tobacco Use   Smoking status:  Never   Smokeless tobacco: Never  Vaping Use   Vaping Use: Never used  Substance and Sexual Activity   Alcohol use: No    Alcohol/week: 0.0 standard drinks of alcohol   Drug use: No   Sexual activity: Yes  Other Topics Concern   Not on file  Social History Narrative   Not on file   Social Determinants of Health   Financial Resource Strain: Not on file  Food Insecurity: Not on file  Transportation Needs: Not on file  Physical Activity: Not on file  Stress: Not on file  Social Connections: Not on file  Intimate Partner Violence: Not on file    FAMILY HISTORY: Family History  Problem Relation Age of Onset   Breast cancer Maternal Aunt 17       currently late 79s   Breast cancer Maternal Grandmother 62       second breast vs. recurrence ca at 74; deceased at 31   Colon cancer Maternal Uncle 44       deceased in 67s   Breast cancer Paternal Aunt        age at dx unknown; currently in 76s   Breast cancer Paternal Grandmother        age at dx unknown   Melanoma Mother        on leg   Testicular cancer Maternal Uncle 28       currently 3s    ALLERGIES:  is allergic to codeine.  MEDICATIONS:  Current Outpatient Medications  Medication Sig Dispense Refill   acetaminophen (TYLENOL) 325 MG tablet Take 650 mg by mouth every 6 (six) hours as needed for moderate pain or headache.     Calcium Carbonate-Vit D-Min (CALCIUM 1200 PO) Take 1 tablet by mouth daily.     cholecalciferol (VITAMIN D) 1000 units tablet Take 1,000 Units by mouth daily.     ELDERBERRY PO Take 1 tablet by mouth daily.     loratadine (CLARITIN) 10 MG tablet Take 10 mg by mouth daily.     Multiple Vitamin (MULTI-VITAMINS) TABS Take 1 tablet by mouth daily.      exemestane (AROMASIN) 25 MG tablet Take 1 tablet (25 mg total) by mouth daily after breakfast. 90 tablet 1   No current facility-administered medications for this visit.   Facility-Administered Medications Ordered in Other Visits  Medication Dose  Route Frequency Provider Last Rate Last Admin   heparin lock flush 100 unit/mL  500 Units Intravenous Once Rickard Patience, MD       sodium chloride flush (NS) 0.9 % injection 10 mL  10 mL Intravenous PRN Rickard Patience, MD   10 mL at 08/01/17 0829     PHYSICAL EXAMINATION: ECOG PERFORMANCE STATUS: 1 - Symptomatic but completely  ambulatory Vitals:   12/10/22 0846  BP: 121/78  Pulse: 93  Resp: 18  Temp: 97.9 F (36.6 C)  SpO2: 100%   Filed Weights   12/10/22 0846  Weight: 173 lb 14.4 oz (78.9 kg)    Physical Exam Constitutional:      General: She is not in acute distress. HENT:     Mouth/Throat:     Pharynx: No oropharyngeal exudate.  Eyes:     General: No scleral icterus. Neck:     Vascular: No JVD.  Cardiovascular:     Rate and Rhythm: Normal rate and regular rhythm.     Heart sounds: Normal heart sounds. No murmur heard.    No friction rub.  Pulmonary:     Effort: Pulmonary effort is normal. No respiratory distress.     Breath sounds: Normal breath sounds. No rales.  Chest:     Chest wall: No tenderness.  Abdominal:     General: Bowel sounds are normal. There is no distension.     Palpations: Abdomen is soft.     Tenderness: There is no abdominal tenderness.  Musculoskeletal:        General: No tenderness or deformity. Normal range of motion.     Cervical back: Normal range of motion and neck supple.  Lymphadenopathy:     Cervical: No cervical adenopathy.  Skin:    General: Skin is warm and dry.     Findings: No erythema.  Neurological:     Mental Status: She is alert and oriented to person, place, and time.     Cranial Nerves: No cranial nerve deficit.     Motor: No abnormal muscle tone.     Coordination: Coordination normal.  Psychiatric:        Mood and Affect: Mood and affect normal.       RADIOGRAPHIC STUDIES: I have personally reviewed the radiological images as listed and agreed with the findings in the report. No results found.

## 2022-12-10 NOTE — Assessment & Plan Note (Signed)
#  Chronic aromatase inhibitor use,  01/16/2021, DEXA showed Osteopenia, right femoral neck T- 1.2, stable.  Plan to repeat DEXA in 2024 Proceed with Zometa today. Recommend patient to continue calcium and vitamin D supplementation. 

## 2023-02-18 ENCOUNTER — Ambulatory Visit
Admission: RE | Admit: 2023-02-18 | Discharge: 2023-02-18 | Disposition: A | Discharge status: Home / Self Care | Payer: 59 | Source: Ambulatory Visit | Attending: Oncology | Admitting: Oncology

## 2023-02-18 DIAGNOSIS — Z170 Estrogen receptor positive status [ER+]: Secondary | ICD-10-CM | POA: Insufficient documentation

## 2023-02-18 DIAGNOSIS — Z79811 Long term (current) use of aromatase inhibitors: Secondary | ICD-10-CM | POA: Insufficient documentation

## 2023-02-18 DIAGNOSIS — C50411 Malignant neoplasm of upper-outer quadrant of right female breast: Secondary | ICD-10-CM | POA: Insufficient documentation

## 2023-03-12 ENCOUNTER — Ambulatory Visit: Payer: 59

## 2023-03-12 ENCOUNTER — Inpatient Hospital Stay: Payer: 59 | Attending: Oncology

## 2023-03-12 DIAGNOSIS — Z79899 Other long term (current) drug therapy: Secondary | ICD-10-CM | POA: Diagnosis not present

## 2023-03-12 DIAGNOSIS — Z170 Estrogen receptor positive status [ER+]: Secondary | ICD-10-CM

## 2023-03-12 DIAGNOSIS — C50411 Malignant neoplasm of upper-outer quadrant of right female breast: Secondary | ICD-10-CM | POA: Insufficient documentation

## 2023-03-12 MED ORDER — GOSERELIN ACETATE 10.8 MG ~~LOC~~ IMPL
10.8000 mg | DRUG_IMPLANT | Freq: Once | SUBCUTANEOUS | Status: AC
  Administered 2023-03-12: 10.8 mg via SUBCUTANEOUS
  Filled 2023-03-12: qty 10.8

## 2023-06-17 ENCOUNTER — Inpatient Hospital Stay: Payer: 59 | Attending: Oncology

## 2023-06-17 ENCOUNTER — Inpatient Hospital Stay: Payer: 59

## 2023-06-17 ENCOUNTER — Inpatient Hospital Stay (HOSPITAL_BASED_OUTPATIENT_CLINIC_OR_DEPARTMENT_OTHER): Payer: 59 | Admitting: Oncology

## 2023-06-17 ENCOUNTER — Encounter: Payer: Self-pay | Admitting: Oncology

## 2023-06-17 VITALS — BP 118/80 | HR 88 | Temp 98.0°F | Resp 18 | Wt 175.6 lb

## 2023-06-17 DIAGNOSIS — Z79811 Long term (current) use of aromatase inhibitors: Secondary | ICD-10-CM | POA: Diagnosis not present

## 2023-06-17 DIAGNOSIS — C50411 Malignant neoplasm of upper-outer quadrant of right female breast: Secondary | ICD-10-CM | POA: Insufficient documentation

## 2023-06-17 DIAGNOSIS — Z170 Estrogen receptor positive status [ER+]: Secondary | ICD-10-CM | POA: Insufficient documentation

## 2023-06-17 DIAGNOSIS — M85851 Other specified disorders of bone density and structure, right thigh: Secondary | ICD-10-CM | POA: Diagnosis not present

## 2023-06-17 LAB — CBC WITH DIFFERENTIAL (CANCER CENTER ONLY)
Abs Immature Granulocytes: 0.01 10*3/uL (ref 0.00–0.07)
Basophils Absolute: 0 10*3/uL (ref 0.0–0.1)
Basophils Relative: 1 %
Eosinophils Absolute: 0.1 10*3/uL (ref 0.0–0.5)
Eosinophils Relative: 1 %
HCT: 39.9 % (ref 36.0–46.0)
Hemoglobin: 13.2 g/dL (ref 12.0–15.0)
Immature Granulocytes: 0 %
Lymphocytes Relative: 31 %
Lymphs Abs: 1.6 10*3/uL (ref 0.7–4.0)
MCH: 29.3 pg (ref 26.0–34.0)
MCHC: 33.1 g/dL (ref 30.0–36.0)
MCV: 88.7 fL (ref 80.0–100.0)
Monocytes Absolute: 0.4 10*3/uL (ref 0.1–1.0)
Monocytes Relative: 7 %
Neutro Abs: 3.2 10*3/uL (ref 1.7–7.7)
Neutrophils Relative %: 60 %
Platelet Count: 288 10*3/uL (ref 150–400)
RBC: 4.5 MIL/uL (ref 3.87–5.11)
RDW: 11.1 % — ABNORMAL LOW (ref 11.5–15.5)
WBC Count: 5.3 10*3/uL (ref 4.0–10.5)
nRBC: 0 % (ref 0.0–0.2)

## 2023-06-17 LAB — CMP (CANCER CENTER ONLY)
ALT: 21 U/L (ref 0–44)
AST: 23 U/L (ref 15–41)
Albumin: 4 g/dL (ref 3.5–5.0)
Alkaline Phosphatase: 52 U/L (ref 38–126)
Anion gap: 10 (ref 5–15)
BUN: 15 mg/dL (ref 6–20)
CO2: 27 mmol/L (ref 22–32)
Calcium: 9 mg/dL (ref 8.9–10.3)
Chloride: 102 mmol/L (ref 98–111)
Creatinine: 0.67 mg/dL (ref 0.44–1.00)
GFR, Estimated: >60 mL/min (ref >60)
Glucose, Bld: 92 mg/dL (ref 70–99)
Potassium: 4.5 mmol/L (ref 3.5–5.1)
Sodium: 139 mmol/L (ref 135–145)
Total Bilirubin: 0.7 mg/dL (ref <1.2)
Total Protein: 7.1 g/dL (ref 6.5–8.1)

## 2023-06-17 MED ORDER — EXEMESTANE 25 MG PO TABS: 25.0000 mg | ORAL_TABLET | Freq: Every day | ORAL | 1 refills | Status: DC

## 2023-06-17 MED ORDER — GOSERELIN ACETATE 10.8 MG ~~LOC~~ IMPL
10.8000 mg | DRUG_IMPLANT | Freq: Once | SUBCUTANEOUS | Status: AC
  Administered 2023-06-17: 10.8 mg via SUBCUTANEOUS
  Filled 2023-06-17: qty 10.8

## 2023-06-17 MED ORDER — SODIUM CHLORIDE 0.9 % IV SOLN
INTRAVENOUS | Status: DC
  Filled 2023-06-17: qty 250

## 2023-06-17 MED ORDER — ZOLEDRONIC ACID 4 MG/100ML IV SOLN
4.0000 mg | Freq: Once | INTRAVENOUS | Status: AC
  Administered 2023-06-17: 4 mg via INTRAVENOUS
  Filled 2023-06-17: qty 100

## 2023-06-17 NOTE — Patient Instructions (Signed)
Goserelin Implant What is this medication? GOSERELIN (GOE se rel in) treats prostate cancer and breast cancer. It works by decreasing levels of the hormones testosterone and estrogen in the body. This prevents prostate and breast cancer cells from spreading or growing. It may also be used to treat endometriosis. This is a condition where the tissue that lines the uterus grows outside the uterus. It works by decreasing the amount of estrogen your body makes, which reduces heavy bleeding and pain. It can also be used to help thin the lining of the uterus before a surgery used to prevent or reduce heavy periods. This medicine may be used for other purposes; ask your health care provider or pharmacist if you have questions. COMMON BRAND NAME(S): Zoladex, Zoladex 65-Month What should I tell my care team before I take this medication? They need to know if you have any of these conditions: Bone problems Diabetes Heart disease History of irregular heartbeat or rhythm An unusual or allergic reaction to goserelin, other medications, foods, dyes, or preservatives Pregnant or trying to get pregnant Breastfeeding How should I use this medication? This medication is injected under the skin. It is given by your care team in a hospital or clinic setting. Talk to your care team about the use of this medication in children. Special care may be needed. Overdosage: If you think you have taken too much of this medicine contact a poison control center or emergency room at once. NOTE: This medicine is only for you. Do not share this medicine with others. What if I miss a dose? Keep appointments for follow-up doses. It is important not to miss your dose. Call your care team if you are unable to keep an appointment. What may interact with this medication? Do not take this medication with any of the following: Cisapride Dronedarone Pimozide Thioridazine This medication may also interact with the following: Other  medications that cause heart rhythm changes This list may not describe all possible interactions. Give your health care provider a list of all the medicines, herbs, non-prescription drugs, or dietary supplements you use. Also tell them if you smoke, drink alcohol, or use illegal drugs. Some items may interact with your medicine. What should I watch for while using this medication? Visit your care team for regular checks on your progress. Your symptoms may appear to get worse during the first weeks of this therapy. Tell your care team if your symptoms do not start to get better or if they get worse after this time. Using this medication for a long time may weaken your bones. If you smoke or frequently drink alcohol you may increase your risk of bone loss. A family history of osteoporosis, chronic use of medications for seizures (convulsions), or corticosteroids can also increase your risk of bone loss. The risk of bone fractures may be increased. Talk to your care team about your bone health. This medication may increase blood sugar. The risk may be higher in patients who already have diabetes. Ask your care team what you can do to lower your risk of diabetes while taking this medication. This medication should stop regular monthly menstruation in women. Tell your care team if you continue to menstruate. Talk to your care team if you wish to become pregnant or think you might be pregnant. This medication can cause serious birth defects if taken during pregnancy or for 12 weeks after stopping treatment. Talk to your care team about reliable forms of contraception. Do not breastfeed while taking this  medication. This medication may cause infertility. Talk to your care team if you are concerned about your fertility. What side effects may I notice from receiving this medication? Side effects that you should report to your care team as soon as possible: Allergic reactions--skin rash, itching, hives, swelling  of the face, lips, tongue, or throat Change in the amount of urine Heart attack--pain or tightness in the chest, shoulders, arms, or jaw, nausea, shortness of breath, cold or clammy skin, feeling faint or lightheaded Heart rhythm changes--fast or irregular heartbeat, dizziness, feeling faint or lightheaded, chest pain, trouble breathing High blood sugar (hyperglycemia)--increased thirst or amount of urine, unusual weakness or fatigue, blurry vision High calcium level--increased thirst or amount of urine, nausea, vomiting, confusion, unusual weakness or fatigue, bone pain Pain, redness, irritation, or bruising at the injection site Severe back pain, numbness or weakness of the hands, arms, legs, or feet, loss of coordination, loss of bowel or bladder control Stroke--sudden numbness or weakness of the face, arm, or leg, trouble speaking, confusion, trouble walking, loss of balance or coordination, dizziness, severe headache, change in vision Swelling and pain of the tumor site or lymph nodes Trouble passing urine Side effects that usually do not require medical attention (report to your care team if they continue or are bothersome): Change in sex drive or performance Headache Hot flashes Rapid or extreme change in emotion or mood Sweating Swelling of the ankles, hands, or feet Unusual vaginal discharge, itching, or odor This list may not describe all possible side effects. Call your doctor for medical advice about side effects. You may report side effects to FDA at 1-800-FDA-1088. Where should I keep my medication? This medication is given in a hospital or clinic. It will not be stored at home. NOTE: This sheet is a summary. It may not cover all possible information. If you have questions about this medicine, talk to your doctor, pharmacist, or health care provider.  2024 Elsevier/Gold Standard (2021-11-22 00:00:00)  Zoledronic Acid Injection (Cancer) What is this medication? ZOLEDRONIC  ACID (ZOE le dron ik AS id) treats high calcium levels in the blood caused by cancer. It may also be used with chemotherapy to treat weakened bones caused by cancer. It works by slowing down the release of calcium from bones. This lowers calcium levels in your blood. It also makes your bones stronger and less likely to break (fracture). It belongs to a group of medications called bisphosphonates. This medicine may be used for other purposes; ask your health care provider or pharmacist if you have questions. COMMON BRAND NAME(S): Zometa, Zometa Powder What should I tell my care team before I take this medication? They need to know if you have any of these conditions: Dehydration Dental disease Kidney disease Liver disease Low levels of calcium in the blood Lung or breathing disease, such as asthma Receiving steroids, such as dexamethasone or prednisone An unusual or allergic reaction to zoledronic acid, other medications, foods, dyes, or preservatives Pregnant or trying to get pregnant Breast-feeding How should I use this medication? This medication is injected into a vein. It is given by your care team in a hospital or clinic setting. Talk to your care team about the use of this medication in children. Special care may be needed. Overdosage: If you think you have taken too much of this medicine contact a poison control center or emergency room at once. NOTE: This medicine is only for you. Do not share this medicine with others. What if I miss a  dose? Keep appointments for follow-up doses. It is important not to miss your dose. Call your care team if you are unable to keep an appointment. What may interact with this medication? Certain antibiotics given by injection Diuretics, such as bumetanide, furosemide NSAIDs, medications for pain and inflammation, such as ibuprofen or naproxen Teriparatide Thalidomide This list may not describe all possible interactions. Give your health care  provider a list of all the medicines, herbs, non-prescription drugs, or dietary supplements you use. Also tell them if you smoke, drink alcohol, or use illegal drugs. Some items may interact with your medicine. What should I watch for while using this medication? Visit your care team for regular checks on your progress. It may be some time before you see the benefit from this medication. Some people who take this medication have severe bone, joint, or muscle pain. This medication may also increase your risk for jaw problems or a broken thigh bone. Tell your care team right away if you have severe pain in your jaw, bones, joints, or muscles. Tell you care team if you have any pain that does not go away or that gets worse. Tell your dentist and dental surgeon that you are taking this medication. You should not have major dental surgery while on this medication. See your dentist to have a dental exam and fix any dental problems before starting this medication. Take good care of your teeth while on this medication. Make sure you see your dentist for regular follow-up appointments. You should make sure you get enough calcium and vitamin D while you are taking this medication. Discuss the foods you eat and the vitamins you take with your care team. Check with your care team if you have severe diarrhea, nausea, and vomiting, or if you sweat a lot. The loss of too much body fluid may make it dangerous for you to take this medication. You may need bloodwork while taking this medication. Talk to your care team if you wish to become pregnant or think you might be pregnant. This medication can cause serious birth defects. What side effects may I notice from receiving this medication? Side effects that you should report to your care team as soon as possible: Allergic reactions--skin rash, itching, hives, swelling of the face, lips, tongue, or throat Kidney injury--decrease in the amount of urine, swelling of the  ankles, hands, or feet Low calcium level--muscle pain or cramps, confusion, tingling, or numbness in the hands or feet Osteonecrosis of the jaw--pain, swelling, or redness in the mouth, numbness of the jaw, poor healing after dental work, unusual discharge from the mouth, visible bones in the mouth Severe bone, joint, or muscle pain Side effects that usually do not require medical attention (report to your care team if they continue or are bothersome): Constipation Fatigue Fever Loss of appetite Nausea Stomach pain This list may not describe all possible side effects. Call your doctor for medical advice about side effects. You may report side effects to FDA at 1-800-FDA-1088. Where should I keep my medication? This medication is given in a hospital or clinic. It will not be stored at home. NOTE: This sheet is a summary. It may not cover all possible information. If you have questions about this medicine, talk to your doctor, pharmacist, or health care provider.  2024 Elsevier/Gold Standard (2021-08-24 00:00:00)

## 2023-06-17 NOTE — Progress Notes (Signed)
Hematology/Oncology Progress note [phone] [phone]      Patient Care Team: Rickard Patience, MD as PCP - General (Oncology) Scarlett Presto, RN (Inactive) as Oncology Nurse Navigator Katrinka Blazing, Elaina Pattee, MD (Inactive) as Referring Physician (Surgery) Rickard Patience, MD as Consulting Physician (Oncology) Carolan Shiver, MD as Consulting Physician (General Surgery) Carmina Miller, MD as Referring Physician (Radiation Oncology)   REASON FOR VISIT Follow up for management of breast cancer  ASSESSMENT & PLAN:   Cancer Staging  Malignant neoplasm of upper-outer quadrant of right breast in female, estrogen receptor positive (HCC) Staging form: Breast, AJCC 8th Edition - Clinical stage from 07/15/2017: Stage IB (cT2, cN0, cM0, G2, ER+, PR+, HER2-) - Signed by Rickard Patience, MD on 07/15/2017   Malignant neoplasm of upper-outer quadrant of right breast in female, estrogen receptor positive (HCC) #ypT2 ypN0 (i+) (sn), grade 2 breast cancer, ER/PR positive, HER 2 negative. Labs reviewed and discussed with patient. CA 15.3, CA 27.29 levels have been monitored.  Zoladex LA Q 3 months.  Continue Aromasin 25mg  daily.  Annual mammogram - will be ordered by Dr.Cintron. Due Jan 2025  Aromatase inhibitor use #Chronic aromatase inhibitor use, on Adjuvant Zometa since 05/28/2019 01/16/2021, DEXA showed Osteopenia, right femoral neck T- 1.2, stable.   02/18/2023 DEXA showed Osteopenia, FRAX - 10 year major osteoporotic fracture is 2.7%  Proceed with Zometa today. She will have finished 4 years of Adjuvant zometa, I recommend to hold off.  Recommend patient to continue calcium and vitamin D supplementation.   Orders Placed This Encounter  Procedures   CBC with Differential (Cancer Center Only)    Standing Status:   Future    Standing Expiration Date:   06/16/2024   CMP (Cancer Center only)    Standing Status:   Future    Standing Expiration Date:   06/16/2024   Cancer antigen 15-3     Standing Status:   Future    Standing Expiration Date:   06/16/2024   Cancer antigen 27.29    Standing Status:   Future    Standing Expiration Date:   06/16/2024   Follow up per los All questions were answered. The patient knows to call the clinic with any problems, questions or concerns.  Rickard Patience, MD, PhD [facility] Hematology Oncology 06/17/2023     HISTORY OF PRESENTING ILLNESS:  Anne Wu is a  42 y.o.  female with diagnosis of cT2N0M0, grade 2 invasive mammary carcinoma.  She has significant family history on both maternal side as well as paternal side. She felt a right breast mass 2 months ago and diagnostic mammogram showed right upper quandrant 2.2 cm mass, which was confirmed on [place] and biopsied. Right axillary no clinically suspicious lymph node. Pathology showed invasive mammary carcinoma, ER/PR positive,, HER2 IHC  equivocal, FISH negative.   # Patient was evaluated by Dr.Smith. From surgical standpoint, Dr.Smith recommend neoadjuvant chemotherapy to facilitate surgery to obtain negative margin and better surgical outcome. Also given patient's young age, neoadjuvant chemotherapy is reasonable.    #Patient stated that she does not desire future fertility.  # 2D Echo showed normal systolic function, mild mitral regurgitation.  # CT was done  to rule out metastatic disease given micrometastasis. Images were independently reviewed by me and discussed with patient.  Negative for metastatic disease. # Testing did not reveal any pathogenic mutation in any of these genes.  A copy of the genetic test report will be scanned into Epic under the Media tab.The genes analyzed  were the 23 genes on [facility]'s Breast/GYN panel (ATM, BARD1, BRCA1, BRCA2, BRIP1, CDH1, CHEK2, DICER1, EPCAM, MLH1,  MSH2, MSH6, NBN, NF1, PALB2, PMS2, PTEN, RAD50, RAD51C, RAD51D,SMARCA4, STK11, and TP53). 09/08/2017 Interval mammogram after 4 cycle of AC, showed good treatment response. The biopsy-proven malignancy in  the upper outer quadrant of the right breast at posterior depth, associated with scattered microcalcifications and architectural distortion, has significantly decreased in size since the mammogram 06/20/2017. On today's mammogram, the mass measures approximately 1.7 x 2.2 x 1.2 cm (previously 2.4 x 1.6 x 2.7 cm).   Treatment Summary :  Jan 2019- May 2019 Neoadjuvant AC-->T  12/29/2017 Right lumpectomy and sentinel LN biopsy Pathology showed ypT2 ypN0 (i+) (sn) case was discussed on tumor board.ER/PR positive, HER 2 negative.  Aug 2019-September 2019 Adjuvant RT  Oct 2019 to start Zoladex and Aromasin. Mediport was removed.  08/13/2021, bilateral screening mammogram showed no findings suspicious for malignancy  INTERVAL HISTORY Anne Wu is a 42 y.o. female who has above history reviewed by me presents for follow up for breast cancer management  Patient is on ovarian suppression with Zoladex monthly.   Patient takes Aromasin.  Manageable side effects.+ hot flash. No new breast concerns.    Review of Systems  Constitutional:  Negative for appetite change, chills, fatigue and fever.  HENT:   Negative for hearing loss and voice change.   Eyes:  Negative for eye problems.  Respiratory:  Negative for chest tightness and cough.   Cardiovascular:  Negative for chest pain.  Gastrointestinal:  Negative for abdominal distention, abdominal pain and blood in stool.  Endocrine: Positive for hot flashes.  Genitourinary:  Negative for difficulty urinating and frequency.   Musculoskeletal:  Positive for arthralgias.  Skin:  Negative for itching and rash.  Neurological:  Negative for dizziness, extremity weakness and light-headedness.  Hematological:  Negative for adenopathy.  Psychiatric/Behavioral:  Negative for confusion. The patient is not nervous/anxious.     MEDICAL HISTORY:  Past Medical History:  Diagnosis Date   Asthma    AS A CHILD-NO INHALERS   Breast cancer (HCC) 2018   Right  breast cancer-[facility]   Elevated blood pressure, situational    PT STATES HER LAST COUPLE OF MD APPOINTMENTS SHE HAS HAD ELEVATED BP-NEVER HAD A PROBLEM WITH THIS PREVIOUSLY   Genetic testing 09/05/2017   Breast/GYN panel (23 genes) @ [facility] - No pathogenic mutations detected   GERD (gastroesophageal reflux disease)    OCC- NO MEDS   Malignant neoplasm of upper-outer quadrant of right breast in female, estrogen receptor positive (HCC) 2019   [facility] and [facility]   Personal history of chemotherapy    Personal history of radiation therapy     SURGICAL HISTORY: Past Surgical History:  Procedure Laterality Date   BREAST BIOPSY Right 06/20/2017   Invasive Mammary Carcinoma   BREAST EXCISIONAL BIOPSY Right 12/29/2017   RESIDUAL INVASIVE MAMMARY CARCINOMA and DCIS   BREAST LUMPECTOMY Right 12/29/2017   needle localization and sentinel node injection   NO PAST SURGERIES     PARTIAL MASTECTOMY WITH NEEDLE LOCALIZATION Right 12/29/2017   Procedure: PARTIAL MASTECTOMY WITH NEEDLE LOCALIZATION;  Surgeon: Carolan Shiver, MD;  Location: [facility] ORS;  Service: General;  Laterality: Right;   PORTACATH PLACEMENT N/A 07/11/2017   Procedure: INSERTION PORT-A-CATH;  Surgeon: Nadeen Landau, MD;  Location: [facility] ORS;  Service: General;  Laterality: N/A;   SENTINEL NODE BIOPSY Right 12/29/2017   Procedure: SENTINEL NODE BIOPSY;  Surgeon: Carolan Shiver, MD;  Location:  [facility] ORS;  Service: General;  Laterality: Right;    SOCIAL HISTORY: Social History   Socioeconomic History   Marital status: Married    Spouse name: Not on file   Number of children: Not on file   Years of education: Not on file   Highest education level: Not on file  Occupational History   Not on file  Tobacco Use   Smoking status: Never   Smokeless tobacco: Never  Vaping Use   Vaping status: Never Used  Substance and Sexual Activity   Alcohol use: No    Alcohol/week: 0.0 standard drinks of alcohol   Drug use: No    Sexual activity: Yes  Other Topics Concern   Not on file  Social History Narrative   Not on file   Social Determinants of Health   Financial Resource Strain: Not on file  Food Insecurity: Not on file  Transportation Needs: Not on file  Physical Activity: Not on file  Stress: Not on file  Social Connections: Not on file  Intimate Partner Violence: Not on file    FAMILY HISTORY: Family History  Problem Relation Age of Onset   Breast cancer Maternal Aunt 46       currently late 67s   Breast cancer Maternal Grandmother 70       second breast vs. recurrence ca at 13; deceased at 54   Colon cancer Maternal Uncle 84       deceased in 20s   Breast cancer Paternal Aunt        age at dx unknown; currently in 47s   Breast cancer Paternal Grandmother        age at dx unknown   Melanoma Mother        on leg   Testicular cancer Maternal Uncle 57       currently 72s    ALLERGIES:  is allergic to codeine.  MEDICATIONS:  Current Outpatient Medications  Medication Sig Dispense Refill   acetaminophen (TYLENOL) 325 MG tablet Take 650 mg by mouth every 6 (six) hours as needed for moderate pain or headache.     Calcium Carbonate-Vit D-Min (CALCIUM 1200 PO) Take 1 tablet by mouth daily.     cholecalciferol (VITAMIN D) 1000 units tablet Take 1,000 Units by mouth daily.     ELDERBERRY PO Take 1 tablet by mouth daily.     loratadine (CLARITIN) 10 MG tablet Take 10 mg by mouth daily.     Multiple Vitamin (MULTI-VITAMINS) TABS Take 1 tablet by mouth daily.      exemestane (AROMASIN) 25 MG tablet Take 1 tablet (25 mg total) by mouth daily after breakfast. 90 tablet 1   No current facility-administered medications for this visit.   Facility-Administered Medications Ordered in Other Visits  Medication Dose Route Frequency Provider Last Rate Last Admin   heparin lock flush 100 unit/mL  500 Units Intravenous Once Rickard Patience, MD       sodium chloride flush (NS) 0.9 % injection 10 mL  10 mL  Intravenous PRN Rickard Patience, MD   10 mL at 08/01/17 0829     PHYSICAL EXAMINATION: ECOG PERFORMANCE STATUS: 1 - Symptomatic but completely ambulatory Vitals:   06/17/23 0953  BP: 118/80  Pulse: 88  Resp: 18  Temp: 98 F (36.7 C)  SpO2: 100%   Filed Weights   06/17/23 0953  Weight: 175 lb 9.6 oz (79.7 kg)    Physical Exam Constitutional:      General: She is not in  acute distress. HENT:     Mouth/Throat:     Pharynx: No oropharyngeal exudate.  Eyes:     General: No scleral icterus. Neck:     Vascular: No JVD.  Cardiovascular:     Rate and Rhythm: Normal rate and regular rhythm.     Heart sounds: Normal heart sounds. No murmur heard.    No friction rub.  Pulmonary:     Effort: Pulmonary effort is normal. No respiratory distress.     Breath sounds: Normal breath sounds. No rales.  Chest:     Chest wall: No tenderness.  Abdominal:     General: Bowel sounds are normal. There is no distension.     Palpations: Abdomen is soft.     Tenderness: There is no abdominal tenderness.  Musculoskeletal:        General: No tenderness or deformity. Normal range of motion.     Cervical back: Normal range of motion and neck supple.  Lymphadenopathy:     Cervical: No cervical adenopathy.  Skin:    General: Skin is warm and dry.     Findings: No erythema.  Neurological:     Mental Status: She is alert and oriented to person, place, and time.     Cranial Nerves: No cranial nerve deficit.     Motor: No abnormal muscle tone.     Coordination: Coordination normal.  Psychiatric:        Mood and Affect: Mood and affect normal.       RADIOGRAPHIC STUDIES: I have personally reviewed the radiological images as listed and agreed with the findings in the report. No results found.

## 2023-06-17 NOTE — Assessment & Plan Note (Addendum)
#  Chronic aromatase inhibitor use, on Adjuvant Zometa since 05/28/2019 01/16/2021, DEXA showed Osteopenia, right femoral neck T- 1.2, stable.   02/18/2023 DEXA showed Osteopenia, FRAX - 10 year major osteoporotic fracture is 2.7%  Proceed with Zometa today. She will have finished 4 years of Adjuvant zometa, I recommend to hold off.  Recommend patient to continue calcium and vitamin D supplementation.

## 2023-06-17 NOTE — Assessment & Plan Note (Addendum)
#  ypT2 ypN0 (i+) (sn), grade 2 breast cancer, ER/PR positive, HER 2 negative. Labs reviewed and discussed with patient. CA 15.3, CA 27.29 levels have been monitored.  Zoladex LA Q 3 months.  Continue Aromasin 25mg  daily.  Annual mammogram - will be ordered by Dr.Cintron. Due Jan 2025

## 2023-06-18 LAB — CANCER ANTIGEN 15-3: CA 15-3: 39.4 U/mL — ABNORMAL HIGH (ref 0.0–25.0)

## 2023-06-18 LAB — CANCER ANTIGEN 27.29: CA 27.29: 46.5 U/mL — ABNORMAL HIGH (ref 0.0–38.6)

## 2023-06-25 ENCOUNTER — Other Ambulatory Visit: Payer: Self-pay | Admitting: Oncology

## 2023-06-25 ENCOUNTER — Telehealth: Payer: Self-pay

## 2023-06-25 DIAGNOSIS — Z1231 Encounter for screening mammogram for malignant neoplasm of breast: Secondary | ICD-10-CM

## 2023-06-25 DIAGNOSIS — R978 Other abnormal tumor markers: Secondary | ICD-10-CM

## 2023-06-25 DIAGNOSIS — C50411 Malignant neoplasm of upper-outer quadrant of right female breast: Secondary | ICD-10-CM

## 2023-06-25 NOTE — Telephone Encounter (Signed)
Please schedule CT CAP and notify pt of appt details.

## 2023-06-25 NOTE — Telephone Encounter (Signed)
-----   Message from Rickard Patience sent at 06/24/2023  8:27 PM EST ----- Please let patient know that her cancer markers increased.  I recommend CT chest abdomen pelvis w contrast for further evaluation.  Thanks.

## 2023-07-04 ENCOUNTER — Ambulatory Visit
Admission: RE | Admit: 2023-07-04 | Discharge: 2023-07-04 | Disposition: A | Discharge status: Home / Self Care | Payer: 59 | Source: Ambulatory Visit | Attending: Oncology | Admitting: Oncology

## 2023-07-04 DIAGNOSIS — Z170 Estrogen receptor positive status [ER+]: Secondary | ICD-10-CM | POA: Diagnosis present

## 2023-07-04 DIAGNOSIS — R978 Other abnormal tumor markers: Secondary | ICD-10-CM | POA: Diagnosis present

## 2023-07-04 DIAGNOSIS — C50411 Malignant neoplasm of upper-outer quadrant of right female breast: Secondary | ICD-10-CM | POA: Insufficient documentation

## 2023-07-04 MED ORDER — IOHEXOL 300 MG/ML  SOLN
100.0000 mL | Freq: Once | INTRAMUSCULAR | Status: AC | PRN
  Administered 2023-07-04: 100 mL via INTRAVENOUS

## 2023-08-07 ENCOUNTER — Encounter: Payer: Self-pay | Admitting: Oncology

## 2023-08-14 ENCOUNTER — Ambulatory Visit
Admission: RE | Admit: 2023-08-14 | Discharge: 2023-08-14 | Disposition: A | Discharge status: Home / Self Care | Payer: 59 | Source: Ambulatory Visit | Attending: Oncology | Admitting: Oncology

## 2023-08-14 ENCOUNTER — Encounter: Payer: Self-pay | Admitting: Oncology

## 2023-08-14 DIAGNOSIS — Z1231 Encounter for screening mammogram for malignant neoplasm of breast: Secondary | ICD-10-CM | POA: Insufficient documentation

## 2023-09-15 ENCOUNTER — Ambulatory Visit: Payer: 59

## 2023-09-17 ENCOUNTER — Inpatient Hospital Stay: Payer: Self-pay | Attending: Oncology

## 2023-09-17 DIAGNOSIS — Z170 Estrogen receptor positive status [ER+]: Secondary | ICD-10-CM | POA: Diagnosis not present

## 2023-09-17 DIAGNOSIS — Z1721 Progesterone receptor positive status: Secondary | ICD-10-CM | POA: Insufficient documentation

## 2023-09-17 DIAGNOSIS — C50411 Malignant neoplasm of upper-outer quadrant of right female breast: Secondary | ICD-10-CM | POA: Diagnosis present

## 2023-09-17 DIAGNOSIS — Z1732 Human epidermal growth factor receptor 2 negative status: Secondary | ICD-10-CM | POA: Diagnosis not present

## 2023-09-17 DIAGNOSIS — Z5111 Encounter for antineoplastic chemotherapy: Secondary | ICD-10-CM | POA: Insufficient documentation

## 2023-09-17 MED ORDER — GOSERELIN ACETATE 10.8 MG ~~LOC~~ IMPL
10.8000 mg | DRUG_IMPLANT | Freq: Once | SUBCUTANEOUS | Status: AC
  Administered 2023-09-17: 10.8 mg via SUBCUTANEOUS
  Filled 2023-09-17: qty 10.8

## 2023-12-22 ENCOUNTER — Inpatient Hospital Stay: Payer: 59

## 2023-12-22 ENCOUNTER — Encounter: Payer: Self-pay | Admitting: Oncology

## 2023-12-22 ENCOUNTER — Inpatient Hospital Stay (HOSPITAL_BASED_OUTPATIENT_CLINIC_OR_DEPARTMENT_OTHER): Payer: 59 | Admitting: Oncology

## 2023-12-22 ENCOUNTER — Inpatient Hospital Stay: Payer: 59 | Attending: Oncology

## 2023-12-22 VITALS — BP 128/86 | HR 83 | Temp 97.0°F | Resp 18 | Wt 178.1 lb

## 2023-12-22 DIAGNOSIS — Z1721 Progesterone receptor positive status: Secondary | ICD-10-CM | POA: Insufficient documentation

## 2023-12-22 DIAGNOSIS — Z5111 Encounter for antineoplastic chemotherapy: Secondary | ICD-10-CM | POA: Insufficient documentation

## 2023-12-22 DIAGNOSIS — Z9221 Personal history of antineoplastic chemotherapy: Secondary | ICD-10-CM | POA: Insufficient documentation

## 2023-12-22 DIAGNOSIS — C50411 Malignant neoplasm of upper-outer quadrant of right female breast: Secondary | ICD-10-CM

## 2023-12-22 DIAGNOSIS — M85851 Other specified disorders of bone density and structure, right thigh: Secondary | ICD-10-CM | POA: Diagnosis not present

## 2023-12-22 DIAGNOSIS — Z79811 Long term (current) use of aromatase inhibitors: Secondary | ICD-10-CM | POA: Diagnosis not present

## 2023-12-22 DIAGNOSIS — Z170 Estrogen receptor positive status [ER+]: Secondary | ICD-10-CM

## 2023-12-22 DIAGNOSIS — Z1732 Human epidermal growth factor receptor 2 negative status: Secondary | ICD-10-CM | POA: Diagnosis not present

## 2023-12-22 DIAGNOSIS — Z923 Personal history of irradiation: Secondary | ICD-10-CM | POA: Insufficient documentation

## 2023-12-22 LAB — CBC WITH DIFFERENTIAL (CANCER CENTER ONLY)
Abs Immature Granulocytes: 0.01 10*3/uL (ref 0.00–0.07)
Basophils Absolute: 0 10*3/uL (ref 0.0–0.1)
Basophils Relative: 1 %
Eosinophils Absolute: 0.1 10*3/uL (ref 0.0–0.5)
Eosinophils Relative: 2 %
HCT: 39.1 % (ref 36.0–46.0)
Hemoglobin: 12.9 g/dL (ref 12.0–15.0)
Immature Granulocytes: 0 %
Lymphocytes Relative: 28 %
Lymphs Abs: 1.9 10*3/uL (ref 0.7–4.0)
MCH: 29.1 pg (ref 26.0–34.0)
MCHC: 33 g/dL (ref 30.0–36.0)
MCV: 88.3 fL (ref 80.0–100.0)
Monocytes Absolute: 0.4 10*3/uL (ref 0.1–1.0)
Monocytes Relative: 6 %
Neutro Abs: 4.5 10*3/uL (ref 1.7–7.7)
Neutrophils Relative %: 63 %
Platelet Count: 270 10*3/uL (ref 150–400)
RBC: 4.43 MIL/uL (ref 3.87–5.11)
RDW: 11.3 % — ABNORMAL LOW (ref 11.5–15.5)
WBC Count: 6.9 10*3/uL (ref 4.0–10.5)
nRBC: 0 % (ref 0.0–0.2)

## 2023-12-22 LAB — CMP (CANCER CENTER ONLY)
ALT: 14 U/L (ref 0–44)
AST: 19 U/L (ref 15–41)
Albumin: 4.2 g/dL (ref 3.5–5.0)
Alkaline Phosphatase: 55 U/L (ref 38–126)
Anion gap: 9 (ref 5–15)
BUN: 16 mg/dL (ref 6–20)
CO2: 25 mmol/L (ref 22–32)
Calcium: 8.6 mg/dL — ABNORMAL LOW (ref 8.9–10.3)
Chloride: 103 mmol/L (ref 98–111)
Creatinine: 0.62 mg/dL (ref 0.44–1.00)
GFR, Estimated: >60 mL/min (ref >60)
Glucose, Bld: 94 mg/dL (ref 70–99)
Potassium: 4 mmol/L (ref 3.5–5.1)
Sodium: 137 mmol/L (ref 135–145)
Total Bilirubin: 0.5 mg/dL (ref 0.0–1.2)
Total Protein: 7.4 g/dL (ref 6.5–8.1)

## 2023-12-22 MED ORDER — GOSERELIN ACETATE 10.8 MG ~~LOC~~ IMPL
10.8000 mg | DRUG_IMPLANT | Freq: Once | SUBCUTANEOUS | Status: AC
  Administered 2023-12-22: 10.8 mg via SUBCUTANEOUS
  Filled 2023-12-22: qty 10.8

## 2023-12-22 MED ORDER — EXEMESTANE 25 MG PO TABS: 25.0000 mg | ORAL_TABLET | Freq: Every day | ORAL | 1 refills | Status: AC

## 2023-12-22 NOTE — Assessment & Plan Note (Addendum)
#  Chronic aromatase inhibitor use, on Adjuvant Zometa  since 05/28/2019 01/16/2021, DEXA showed Osteopenia, right femoral neck T- 1.2, stable.   02/18/2023 DEXA showed Osteopenia of right femur neck, FRAX - 10 year major osteoporotic fracture is 2.7%  Proceed with Zometa  today. She will have finished 4 years of Adjuvant zometa , I recommend to hold off.  Recommend patient to continue calcium and vitamin D supplementation.

## 2023-12-22 NOTE — Assessment & Plan Note (Signed)
 Recommend calcium and vitamin D supplementation.

## 2023-12-22 NOTE — Assessment & Plan Note (Addendum)
#  ypT2 ypN0 (i+) (sn), grade 2 breast cancer, ER/PR positive, HER 2 negative. Labs reviewed and discussed with patient. CA 15.3, CA 27.29 levels have been monitored.  Zoladex  LA Q 3 months.  Continue Aromasin  25mg  daily [ Annual mammogram - ordered by Dr.Cintron. Due Jan 2026

## 2023-12-22 NOTE — Progress Notes (Signed)
 Hematology/Oncology Progress note [phone] [phone]      Patient Care Team: Timmy Forbes, MD as PCP - General (Oncology) Arlette Benders, RN (Inactive) as Oncology Nurse Navigator Felipe Horton, Heyward Loud, MD (Inactive) as Referring Physician (Surgery) Timmy Forbes, MD as Consulting Physician (Oncology) Eldred Grego, MD as Consulting Physician (General Surgery) Glenis Langdon, MD as Referring Physician (Radiation Oncology)   REASON FOR VISIT Follow up for management of breast cancer  ASSESSMENT & PLAN:   Cancer Staging  Malignant neoplasm of upper-outer quadrant of right breast in female, estrogen receptor positive (HCC) Staging form: Breast, AJCC 8th Edition - Clinical stage from 07/15/2017: Stage IB (cT2, cN0, cM0, G2, ER+, PR+, HER2-) - Signed by Timmy Forbes, MD on 07/15/2017   Malignant neoplasm of upper-outer quadrant of right breast in female, estrogen receptor positive (HCC) #ypT2 ypN0 (i+) (sn), grade 2 breast cancer, ER/PR positive, HER 2 negative. Labs reviewed and discussed with patient. CA 15.3, CA 27.29 levels have been monitored.  Zoladex  LA Q 3 months.  Continue Aromasin  25mg  daily [ Annual mammogram - ordered by Dr.Cintron. Due Jan 2026  Osteopenia #Chronic aromatase inhibitor use, on Adjuvant Zometa  since 05/28/2019 01/16/2021, DEXA showed Osteopenia, right femoral neck T- 1.2, stable.   02/18/2023 DEXA showed Osteopenia of right femur neck, FRAX - 10 year major osteoporotic fracture is 2.7%  Proceed with Zometa  today. She will have finished 4 years of Adjuvant zometa , I recommend to hold off.  Recommend patient to continue calcium and vitamin D supplementation.   Orders Placed This Encounter  Procedures   CBC with Differential (Cancer Center Only)    Standing Status:   Future    Expected Date:   06/22/2024    Expiration Date:   12/21/2024   CMP (Cancer Center only)    Standing Status:   Future    Expected Date:   06/22/2024    Expiration  Date:   12/21/2024   Cancer antigen 27.29    Standing Status:   Future    Expected Date:   06/22/2024    Expiration Date:   12/21/2024   Cancer antigen 15-3    Standing Status:   Future    Expected Date:   06/22/2024    Expiration Date:   12/21/2024   Follow up per los All questions were answered. The patient knows to call the clinic with any problems, questions or concerns.  Timmy Forbes, MD, PhD [facility] Hematology Oncology 12/22/2023     HISTORY OF PRESENTING ILLNESS:  Anne Wu is a  43 y.o.  female with diagnosis of cT2N0M0, grade 2 invasive mammary carcinoma.  She has significant family history on both maternal side as well as paternal side. She felt a right breast mass 2 months ago and diagnostic mammogram showed right upper quandrant 2.2 cm mass, which was confirmed on US  and biopsied. Right axillary no clinically suspicious lymph node. Pathology showed invasive mammary carcinoma, ER/PR positive,, HER2 IHC  equivocal, FISH negative.   # Patient was evaluated by Dr.Smith. From surgical standpoint, Dr.Smith recommend neoadjuvant chemotherapy to facilitate surgery to obtain negative margin and better surgical outcome. Also given patient's young age, neoadjuvant chemotherapy is reasonable.    #Patient stated that she does not desire future fertility.  # 2D Echo showed normal systolic function, mild mitral regurgitation.  # CT was done  to rule out metastatic disease given micrometastasis. Images were independently reviewed by me and discussed with patient.  Negative for metastatic disease. # Testing did not  reveal any pathogenic mutation in any of these genes.  A copy of the genetic test report will be scanned into Epic under the Media tab.The genes analyzed were the 23 genes on [facility]'s Breast/GYN panel (ATM, BARD1, BRCA1, BRCA2, BRIP1, CDH1, CHEK2, DICER1, EPCAM, MLH1,  MSH2, MSH6, NBN, NF1, PALB2, PMS2, PTEN, RAD50, RAD51C, RAD51D,SMARCA4, STK11, and TP53). 09/08/2017 Interval mammogram  after 4 cycle of AC, showed good treatment response. The biopsy-proven malignancy in the upper outer quadrant of the right breast at posterior depth, associated with scattered microcalcifications and architectural distortion, has significantly decreased in size since the mammogram 06/20/2017. On today's mammogram, the mass measures approximately 1.7 x 2.2 x 1.2 cm (previously 2.4 x 1.6 x 2.7 cm).   Treatment Summary :  Jan 2019- May 2019 Neoadjuvant AC-->T  12/29/2017 Right lumpectomy and sentinel LN biopsy Pathology showed ypT2 ypN0 (i+) (sn) case was discussed on tumor board.ER/PR positive, HER 2 negative.  Aug 2019-September 2019 Adjuvant RT  Oct 2019 to start Zoladex  and Aromasin . Mediport was removed.  08/13/2021, bilateral screening mammogram showed no findings suspicious for malignancy  INTERVAL HISTORY Anne Wu is a 43 y.o. female who has above history reviewed by me presents for follow up for breast cancer management  Patient is on ovarian suppression with Zoladex  Q3 months Patient takes Aromasin .  Manageable side effects.+ hot flash. No new breast concerns.    Review of Systems  Constitutional:  Negative for appetite change, chills, fatigue and fever.  HENT:   Negative for hearing loss and voice change.   Eyes:  Negative for eye problems.  Respiratory:  Negative for chest tightness and cough.   Cardiovascular:  Negative for chest pain.  Gastrointestinal:  Negative for abdominal distention, abdominal pain and blood in stool.  Endocrine: Positive for hot flashes.  Genitourinary:  Negative for difficulty urinating and frequency.   Musculoskeletal:  Positive for arthralgias.  Skin:  Negative for itching and rash.  Neurological:  Negative for dizziness, extremity weakness and light-headedness.  Hematological:  Negative for adenopathy.  Psychiatric/Behavioral:  Negative for confusion. The patient is not nervous/anxious.     MEDICAL HISTORY:  Past Medical History:  Diagnosis  Date   Asthma    AS A CHILD-NO INHALERS   Breast cancer (HCC) 2018   Right breast cancer-[facility]   Elevated blood pressure, situational    PT STATES HER LAST COUPLE OF MD APPOINTMENTS SHE HAS HAD ELEVATED BP-NEVER HAD A PROBLEM WITH THIS PREVIOUSLY   Genetic testing 09/05/2017   Breast/GYN panel (23 genes) @ [facility] - No pathogenic mutations detected   GERD (gastroesophageal reflux disease)    OCC- NO MEDS   Malignant neoplasm of upper-outer quadrant of right breast in female, estrogen receptor positive (HCC) 2019   [facility] and [facility]   Personal history of chemotherapy    Personal history of radiation therapy     SURGICAL HISTORY: Past Surgical History:  Procedure Laterality Date   BREAST BIOPSY Right 06/20/2017   Invasive Mammary Carcinoma   BREAST EXCISIONAL BIOPSY Right 12/29/2017   RESIDUAL INVASIVE MAMMARY CARCINOMA and DCIS   BREAST LUMPECTOMY Right 12/29/2017   needle localization and sentinel node injection   NO PAST SURGERIES     PARTIAL MASTECTOMY WITH NEEDLE LOCALIZATION Right 12/29/2017   Procedure: PARTIAL MASTECTOMY WITH NEEDLE LOCALIZATION;  Surgeon: Eldred Grego, MD;  Location: [facility] ORS;  Service: General;  Laterality: Right;   PORTACATH PLACEMENT N/A 07/11/2017   Procedure: INSERTION PORT-A-CATH;  Surgeon: Benancio Bracket, MD;  Location: [facility]  ORS;  Service: General;  Laterality: N/A;   SENTINEL NODE BIOPSY Right 12/29/2017   Procedure: SENTINEL NODE BIOPSY;  Surgeon: Eldred Grego, MD;  Location: [facility] ORS;  Service: General;  Laterality: Right;    SOCIAL HISTORY: Social History   Socioeconomic History   Marital status: Married    Spouse name: Not on file   Number of children: Not on file   Years of education: Not on file   Highest education level: Not on file  Occupational History   Not on file  Tobacco Use   Smoking status: Never   Smokeless tobacco: Never  Vaping Use   Vaping status: Never Used  Substance and Sexual Activity   Alcohol  use: No    Alcohol/week: 0.0 standard drinks of alcohol   Drug use: No   Sexual activity: Yes  Other Topics Concern   Not on file  Social History Narrative   Not on file   Social Drivers of Health   Financial Resource Strain: Not on file  Food Insecurity: Not on file  Transportation Needs: Not on file  Physical Activity: Not on file  Stress: Not on file  Social Connections: Not on file  Intimate Partner Violence: Not on file    FAMILY HISTORY: Family History  Problem Relation Age of Onset   Breast cancer Maternal Aunt 67       currently late 24s   Breast cancer Maternal Grandmother 53       second breast vs. recurrence ca at 51; deceased at 47   Colon cancer Maternal Uncle 38       deceased in 106s   Breast cancer Paternal Aunt        age at dx unknown; currently in 65s   Breast cancer Paternal Grandmother        age at dx unknown   Melanoma Mother        on leg   Testicular cancer Maternal Uncle 32       currently 35s    ALLERGIES:  is allergic to codeine.  MEDICATIONS:  Current Outpatient Medications  Medication Sig Dispense Refill   acetaminophen  (TYLENOL ) 325 MG tablet Take 650 mg by mouth every 6 (six) hours as needed for moderate pain or headache.     Calcium Carbonate-Vit D-Min (CALCIUM 1200 PO) Take 1 tablet by mouth daily.     cholecalciferol (VITAMIN D) 1000 units tablet Take 1,000 Units by mouth daily.     ELDERBERRY PO Take 1 tablet by mouth daily.     loratadine (CLARITIN) 10 MG tablet Take 10 mg by mouth daily.     Multiple Vitamin (MULTI-VITAMINS) TABS Take 1 tablet by mouth daily.      exemestane  (AROMASIN ) 25 MG tablet Take 1 tablet (25 mg total) by mouth daily after breakfast. 90 tablet 1   No current facility-administered medications for this visit.   Facility-Administered Medications Ordered in Other Visits  Medication Dose Route Frequency Provider Last Rate Last Admin   heparin  lock flush 100 unit/mL  500 Units Intravenous Once Timmy Forbes, MD        sodium chloride  flush (NS) 0.9 % injection 10 mL  10 mL Intravenous PRN Timmy Forbes, MD   10 mL at 08/01/17 0829     PHYSICAL EXAMINATION: ECOG PERFORMANCE STATUS: 1 - Symptomatic but completely ambulatory Vitals:   12/22/23 1446  BP: 128/86  Pulse: 83  Resp: 18  Temp: (!) 97 F (36.1 C)   Filed Weights   12/22/23  1446  Weight: 178 lb 1.6 oz (80.8 kg)    Physical Exam Constitutional:      General: She is not in acute distress. HENT:     Mouth/Throat:     Pharynx: No oropharyngeal exudate.  Eyes:     General: No scleral icterus. Neck:     Vascular: No JVD.  Cardiovascular:     Rate and Rhythm: Normal rate and regular rhythm.     Heart sounds: Normal heart sounds. No murmur heard.    No friction rub.  Pulmonary:     Effort: Pulmonary effort is normal. No respiratory distress.     Breath sounds: Normal breath sounds. No rales.  Chest:     Chest wall: No tenderness.  Abdominal:     General: Bowel sounds are normal. There is no distension.     Palpations: Abdomen is soft.     Tenderness: There is no abdominal tenderness.  Musculoskeletal:        General: No tenderness or deformity. Normal range of motion.     Cervical back: Normal range of motion and neck supple.  Lymphadenopathy:     Cervical: No cervical adenopathy.  Skin:    General: Skin is warm and dry.     Findings: No erythema.  Neurological:     Mental Status: She is alert and oriented to person, place, and time.     Cranial Nerves: No cranial nerve deficit.     Motor: No abnormal muscle tone.     Coordination: Coordination normal.  Psychiatric:        Mood and Affect: Mood and affect normal.       RADIOGRAPHIC STUDIES: I have personally reviewed the radiological images as listed and agreed with the findings in the report. No results found.

## 2023-12-23 LAB — CANCER ANTIGEN 27.29: CA 27.29: 48.6 U/mL — ABNORMAL HIGH (ref 0.0–38.6)

## 2023-12-23 LAB — CANCER ANTIGEN 15-3: CA 15-3: 36.1 U/mL — ABNORMAL HIGH (ref 0.0–25.0)

## 2024-01-04 ENCOUNTER — Ambulatory Visit: Payer: Self-pay | Admitting: Oncology

## 2024-01-05 ENCOUNTER — Inpatient Hospital Stay

## 2024-01-05 ENCOUNTER — Other Ambulatory Visit: Payer: Self-pay

## 2024-01-05 DIAGNOSIS — C50411 Malignant neoplasm of upper-outer quadrant of right female breast: Secondary | ICD-10-CM

## 2024-01-05 LAB — GENETIC SCREENING ORDER

## 2024-01-05 NOTE — Progress Notes (Signed)
 Pt scheduled this afternoon.

## 2024-02-07 LAB — SIGNATERA
SIGNATERA MTM READOUT: 0 MTM/ml
SIGNATERA TEST RESULT: NEGATIVE

## 2024-02-09 ENCOUNTER — Encounter: Payer: Self-pay | Admitting: Oncology

## 2024-03-23 ENCOUNTER — Inpatient Hospital Stay: Attending: Oncology

## 2024-03-23 ENCOUNTER — Other Ambulatory Visit: Payer: Self-pay | Admitting: Oncology

## 2024-03-23 DIAGNOSIS — Z5111 Encounter for antineoplastic chemotherapy: Secondary | ICD-10-CM | POA: Diagnosis present

## 2024-03-23 DIAGNOSIS — Z170 Estrogen receptor positive status [ER+]: Secondary | ICD-10-CM | POA: Diagnosis not present

## 2024-03-23 DIAGNOSIS — C50411 Malignant neoplasm of upper-outer quadrant of right female breast: Secondary | ICD-10-CM | POA: Diagnosis present

## 2024-03-23 DIAGNOSIS — Z1732 Human epidermal growth factor receptor 2 negative status: Secondary | ICD-10-CM | POA: Insufficient documentation

## 2024-03-23 DIAGNOSIS — Z1721 Progesterone receptor positive status: Secondary | ICD-10-CM | POA: Insufficient documentation

## 2024-03-23 MED ORDER — GOSERELIN ACETATE 10.8 MG ~~LOC~~ IMPL
10.8000 mg | DRUG_IMPLANT | Freq: Once | SUBCUTANEOUS | Status: AC
  Administered 2024-03-23: 10.8 mg via SUBCUTANEOUS
  Filled 2024-03-23: qty 10.8

## 2024-05-27 ENCOUNTER — Other Ambulatory Visit: Payer: Self-pay

## 2024-05-27 DIAGNOSIS — Z170 Estrogen receptor positive status [ER+]: Secondary | ICD-10-CM

## 2024-06-22 ENCOUNTER — Encounter: Payer: Self-pay | Admitting: Oncology

## 2024-06-22 ENCOUNTER — Inpatient Hospital Stay: Attending: Oncology

## 2024-06-22 ENCOUNTER — Ambulatory Visit

## 2024-06-22 ENCOUNTER — Inpatient Hospital Stay: Admitting: Oncology

## 2024-06-22 VITALS — BP 126/87 | HR 92 | Temp 97.3°F | Resp 16 | Wt 184.0 lb

## 2024-06-22 DIAGNOSIS — Z79811 Long term (current) use of aromatase inhibitors: Secondary | ICD-10-CM | POA: Diagnosis not present

## 2024-06-22 DIAGNOSIS — Z9011 Acquired absence of right breast and nipple: Secondary | ICD-10-CM | POA: Diagnosis not present

## 2024-06-22 DIAGNOSIS — Z79899 Other long term (current) drug therapy: Secondary | ICD-10-CM | POA: Insufficient documentation

## 2024-06-22 DIAGNOSIS — Z923 Personal history of irradiation: Secondary | ICD-10-CM | POA: Diagnosis not present

## 2024-06-22 DIAGNOSIS — Z170 Estrogen receptor positive status [ER+]: Secondary | ICD-10-CM | POA: Insufficient documentation

## 2024-06-22 DIAGNOSIS — Z9221 Personal history of antineoplastic chemotherapy: Secondary | ICD-10-CM | POA: Insufficient documentation

## 2024-06-22 DIAGNOSIS — M85851 Other specified disorders of bone density and structure, right thigh: Secondary | ICD-10-CM | POA: Insufficient documentation

## 2024-06-22 DIAGNOSIS — C50411 Malignant neoplasm of upper-outer quadrant of right female breast: Secondary | ICD-10-CM | POA: Diagnosis present

## 2024-06-22 DIAGNOSIS — M8580 Other specified disorders of bone density and structure, unspecified site: Secondary | ICD-10-CM | POA: Diagnosis not present

## 2024-06-22 LAB — CBC WITH DIFFERENTIAL (CANCER CENTER ONLY)
Abs Immature Granulocytes: 0.02 K/uL (ref 0.00–0.07)
Basophils Absolute: 0 K/uL (ref 0.0–0.1)
Basophils Relative: 1 %
Eosinophils Absolute: 0.1 K/uL (ref 0.0–0.5)
Eosinophils Relative: 1 %
HCT: 41.4 % (ref 36.0–46.0)
Hemoglobin: 13.8 g/dL (ref 12.0–15.0)
Immature Granulocytes: 0 %
Lymphocytes Relative: 26 %
Lymphs Abs: 1.8 K/uL (ref 0.7–4.0)
MCH: 29 pg (ref 26.0–34.0)
MCHC: 33.3 g/dL (ref 30.0–36.0)
MCV: 87 fL (ref 80.0–100.0)
Monocytes Absolute: 0.4 K/uL (ref 0.1–1.0)
Monocytes Relative: 6 %
Neutro Abs: 4.5 K/uL (ref 1.7–7.7)
Neutrophils Relative %: 66 %
Platelet Count: 291 K/uL (ref 150–400)
RBC: 4.76 MIL/uL (ref 3.87–5.11)
RDW: 11.2 % — ABNORMAL LOW (ref 11.5–15.5)
WBC Count: 6.8 K/uL (ref 4.0–10.5)
nRBC: 0 % (ref 0.0–0.2)

## 2024-06-22 LAB — CMP (CANCER CENTER ONLY)
ALT: 16 U/L (ref 0–44)
AST: 24 U/L (ref 15–41)
Albumin: 4.8 g/dL (ref 3.5–5.0)
Alkaline Phosphatase: 75 U/L (ref 38–126)
Anion gap: 12 (ref 5–15)
BUN: 13 mg/dL (ref 6–20)
CO2: 27 mmol/L (ref 22–32)
Calcium: 9.8 mg/dL (ref 8.9–10.3)
Chloride: 101 mmol/L (ref 98–111)
Creatinine: 0.64 mg/dL (ref 0.44–1.00)
GFR, Estimated: >60 mL/min (ref >60)
Glucose, Bld: 100 mg/dL — ABNORMAL HIGH (ref 70–99)
Potassium: 4.4 mmol/L (ref 3.5–5.1)
Sodium: 139 mmol/L (ref 135–145)
Total Bilirubin: <0.2 mg/dL (ref 0.0–1.2)
Total Protein: 7.6 g/dL (ref 6.5–8.1)

## 2024-06-22 LAB — GENETIC SCREENING ORDER

## 2024-06-22 MED ORDER — GOSERELIN ACETATE 10.8 MG ~~LOC~~ IMPL
10.8000 mg | DRUG_IMPLANT | Freq: Once | SUBCUTANEOUS | Status: AC
  Administered 2024-06-22: 10.8 mg via SUBCUTANEOUS
  Filled 2024-06-22: qty 10.8

## 2024-06-22 NOTE — Assessment & Plan Note (Signed)
#  Chronic aromatase inhibitor use, on Adjuvant Zometa  since 05/28/2019 01/16/2021, DEXA showed Osteopenia, right femoral neck T- 1.2, stable.   02/18/2023 DEXA showed Osteopenia of right femur neck, FRAX - 10 year major osteoporotic fracture is 2.7%  She has finished 4 years of Adjuvant zometa , currently off. Repeat DEXA in August 2026 Recommend patient to continue calcium and vitamin D supplementation.

## 2024-06-22 NOTE — Assessment & Plan Note (Addendum)
#  ypT2 ypN0 (i+) (sn), grade 2 breast cancer, ER/PR positive, HER 2 negative. Labs reviewed and discussed with patient. CA 15.3, CA 27.29 levels have been monitored. Signatera Q6 months Zoladex  LA Q 3 months.  Continue Aromasin  25mg  daily [ she is on extended endocrinotherapy] Annual mammogram - ordered by Dr.Cintron. Due Jan 2026

## 2024-06-22 NOTE — Progress Notes (Signed)
 Hematology/Oncology Progress note [phone] [phone]      Patient Care Team: Babara Call, MD as PCP - General (Oncology) Dannielle Arlean FALCON, RN (Inactive) as Oncology Nurse Navigator Claudene, Larinda Bolder, MD (Inactive) as Referring Physician (Surgery) Babara Call, MD as Consulting Physician (Oncology) Rodolph Romano, MD as Consulting Physician (General Surgery) Lenn Aran, MD as Referring Physician (Radiation Oncology)   REASON FOR VISIT Follow up for management of breast cancer  ASSESSMENT & PLAN:   Cancer Staging  Malignant neoplasm of upper-outer quadrant of right breast in female, estrogen receptor positive (HCC) Staging form: Breast, AJCC 8th Edition - Clinical stage from 07/15/2017: Stage IB (cT2, cN0, cM0, G2, ER+, PR+, HER2-) - Signed by Babara Call, MD on 07/15/2017   Malignant neoplasm of upper-outer quadrant of right breast in female, estrogen receptor positive (HCC) #ypT2 ypN0 (i+) (sn), grade 2 breast cancer, ER/PR positive, HER 2 negative. Labs reviewed and discussed with patient. CA 15.3, CA 27.29 levels have been monitored. Signatera Q6 months Zoladex  LA Q 3 months.  Continue Aromasin  25mg  daily [ she is on extended endocrinotherapy] Annual mammogram - ordered by Dr.Cintron. Due Jan 2026  Osteopenia #Chronic aromatase inhibitor use, on Adjuvant Zometa  since 05/28/2019 01/16/2021, DEXA showed Osteopenia, right femoral neck T- 1.2, stable.   02/18/2023 DEXA showed Osteopenia of right femur neck, FRAX - 10 year major osteoporotic fracture is 2.7%  She has finished 4 years of Adjuvant zometa , currently off. Repeat DEXA in August 2026 Recommend patient to continue calcium and vitamin D supplementation.  Hypocalcemia Improved.  Recommend calcium and vitamin D supplementation.   Orders Placed This Encounter  Procedures   Cancer antigen 27.29    Standing Status:   Future    Expected Date:   09/20/2024    Expiration Date:   12/19/2024   Cancer  antigen 15-3    Standing Status:   Future    Expected Date:   09/20/2024    Expiration Date:   12/19/2024   CBC with Differential (Cancer Center Only)    Standing Status:   Future    Expected Date:   09/20/2024    Expiration Date:   12/19/2024   CMP (Cancer Center only)    Standing Status:   Future    Expected Date:   09/20/2024    Expiration Date:   12/19/2024   Genetic Screening Order    Standing Status:   Future    Expected Date:   09/20/2024    Expiration Date:   12/19/2024   Follow up per los All questions were answered. The patient knows to call the clinic with any problems, questions or concerns.  Call Babara, MD, PhD [facility] Hematology Oncology 06/22/2024     HISTORY OF PRESENTING ILLNESS:  Anne Wu is a  43 y.o.  female with diagnosis of cT2N0M0, grade 2 invasive mammary carcinoma.  She has significant family history on both maternal side as well as paternal side. She felt a right breast mass 2 months ago and diagnostic mammogram showed right upper quandrant 2.2 cm mass, which was confirmed on US  and biopsied. Right axillary no clinically suspicious lymph node. Pathology showed invasive mammary carcinoma, ER/PR positive,, HER2 IHC  equivocal, FISH negative.   # Patient was evaluated by Dr.Smith. From surgical standpoint, Dr.Smith recommend neoadjuvant chemotherapy to facilitate surgery to obtain negative margin and better surgical outcome. Also given patient's young age, neoadjuvant chemotherapy is reasonable.    #Patient stated that she does not desire future fertility.  #  2D Echo showed normal systolic function, mild mitral regurgitation.  # CT was done  to rule out metastatic disease given micrometastasis. Images were independently reviewed by me and discussed with patient.  Negative for metastatic disease. # Testing did not reveal any pathogenic mutation in any of these genes.  A copy of the genetic test report will be scanned into Epic under the Media tab.The genes analyzed were  the 23 genes on [facility]'s Breast/GYN panel (ATM, BARD1, BRCA1, BRCA2, BRIP1, CDH1, CHEK2, DICER1, EPCAM, MLH1,  MSH2, MSH6, NBN, NF1, PALB2, PMS2, PTEN, RAD50, RAD51C, RAD51D,SMARCA4, STK11, and TP53). 09/08/2017 Interval mammogram after 4 cycle of AC, showed good treatment response. The biopsy-proven malignancy in the upper outer quadrant of the right breast at posterior depth, associated with scattered microcalcifications and architectural distortion, has significantly decreased in size since the mammogram 06/20/2017. On today's mammogram, the mass measures approximately 1.7 x 2.2 x 1.2 cm (previously 2.4 x 1.6 x 2.7 cm).   Treatment Summary :  Jan 2019- May 2019 Neoadjuvant AC-->T  12/29/2017 Right lumpectomy and sentinel LN biopsy Pathology showed ypT2 ypN0 (i+) (sn) case was discussed on tumor board.ER/PR positive, HER 2 negative.  Aug 2019-September 2019 Adjuvant RT  Oct 2019 to start Zoladex  and Aromasin . Mediport was removed.  08/13/2021, bilateral screening mammogram showed no findings suspicious for malignancy  INTERVAL HISTORY Anne Wu is a 43 y.o. female who has above history reviewed by me presents for follow up for breast cancer management  Patient is on ovarian suppression with Zoladex  Q3 months Patient takes Aromasin .  She tolerates well,  Manageable side effects.+ hot flash. No new breast concerns.    Review of Systems  Constitutional:  Negative for appetite change, chills, fatigue and fever.  HENT:   Negative for hearing loss and voice change.   Eyes:  Negative for eye problems.  Respiratory:  Negative for chest tightness and cough.   Cardiovascular:  Negative for chest pain.  Gastrointestinal:  Negative for abdominal distention, abdominal pain and blood in stool.  Endocrine: Positive for hot flashes.  Genitourinary:  Negative for difficulty urinating and frequency.   Musculoskeletal:  Positive for arthralgias.  Skin:  Negative for itching and rash.  Neurological:   Negative for dizziness, extremity weakness and light-headedness.  Hematological:  Negative for adenopathy.  Psychiatric/Behavioral:  Negative for confusion. The patient is not nervous/anxious.     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   Personal history of chemotherapy    Personal history of radiation therapy     SURGICAL HISTORY: Past Surgical History:  Procedure Laterality Date   BREAST BIOPSY Right 06/20/2017   Invasive Mammary Carcinoma   BREAST EXCISIONAL BIOPSY Right 12/29/2017   RESIDUAL INVASIVE MAMMARY CARCINOMA and DCIS   BREAST LUMPECTOMY Right 12/29/2017   needle localization and sentinel node injection   NO PAST SURGERIES     PARTIAL MASTECTOMY WITH NEEDLE LOCALIZATION  Right 12/29/2017   Procedure: PARTIAL MASTECTOMY WITH NEEDLE LOCALIZATION;  Surgeon: Rodolph Romano, MD;  Location: [facility] ORS;  Service: General;  Laterality: Right;   PORTACATH PLACEMENT N/A 07/11/2017   Procedure: INSERTION PORT-A-CATH;  Surgeon: Claudene Larinda Bolder, MD;  Location: [facility] ORS;  Service: General;  Laterality: N/A;   SENTINEL NODE BIOPSY Right 12/29/2017   Procedure: SENTINEL NODE BIOPSY;  Surgeon: Rodolph Romano, MD;  Location: [facility] ORS;  Service: General;  Laterality: Right;    SOCIAL HISTORY: Social History   Socioeconomic History   Marital status: Married    Spouse name: Not on file   Number of children: Not on file   Years of  education: Not on file   Highest education level: Not on file  Occupational History   Not on file  Tobacco Use   Smoking status: Never   Smokeless tobacco: Never  Vaping Use   Vaping status: Never Used  Substance and Sexual Activity   Alcohol use: No    Alcohol/week: 0.0 standard drinks of alcohol   Drug use: No   Sexual activity: Yes  Other Topics Concern   Not on file  Social History Narrative   Not on file   Social Drivers of Health   Financial Resource Strain: Not on file  Food Insecurity: Not on file  Transportation Needs: Not on file  Physical Activity: Not on file  Stress: Not on file  Social Connections: Not on file  Intimate Partner Violence: Not on file    FAMILY HISTORY: Family History  Problem Relation Age of Onset   Breast cancer Maternal Aunt 94       currently late 65s   Breast cancer Maternal Grandmother 1       second breast vs. recurrence ca at 58; deceased at 78   Colon cancer Maternal Uncle 50       deceased in 71s   Breast cancer Paternal Aunt        age at dx unknown; currently in 2s   Breast cancer Paternal Grandmother        age at dx unknown   Melanoma Mother        on leg   Testicular cancer Maternal Uncle 37       currently 60s    ALLERGIES:  is allergic to codeine.  MEDICATIONS:  Current Outpatient Medications  Medication Sig Dispense Refill   acetaminophen  (TYLENOL ) 325 MG tablet Take 650 mg by mouth every 6 (six) hours as needed for moderate pain or headache.     Calcium Carbonate-Vit D-Min (CALCIUM 1200 PO) Take 1 tablet by mouth daily.     exemestane  (AROMASIN ) 25 MG tablet Take 1 tablet (25 mg total) by mouth daily after breakfast. 90 tablet 1   loratadine (CLARITIN) 10 MG tablet Take 10 mg by mouth daily. (Patient taking differently: Take 10 mg by mouth daily. Prn)     Multiple Vitamin (MULTI-VITAMINS) TABS Take 1 tablet by mouth daily.      cholecalciferol (VITAMIN D) 1000 units tablet Take 1,000 Units by mouth daily.      No current facility-administered medications for this visit.   Facility-Administered Medications Ordered in Other Visits  Medication Dose Route Frequency Provider Last Rate Last Admin   heparin  lock flush 100 unit/mL  500 Units Intravenous Once Anique Beckley, MD       sodium chloride  flush (NS) 0.9 % injection 10 mL  10 mL Intravenous PRN Babara Call, MD   10 mL at 08/01/17 [phone]  PHYSICAL EXAMINATION: ECOG PERFORMANCE STATUS: 1 - Symptomatic but completely ambulatory Vitals:   06/22/24 1500  BP: 126/87  Pulse: 92  Resp: 16  Temp: (!) 97.3 F (36.3 C)  SpO2: 98%   Filed Weights   06/22/24 1500  Weight: 184 lb (83.5 kg)    Physical Exam Constitutional:      General: She is not in acute distress. HENT:     Mouth/Throat:     Pharynx: No oropharyngeal exudate.  Eyes:     General: No scleral icterus. Neck:     Vascular: No JVD.  Cardiovascular:     Rate and Rhythm: Normal rate and regular rhythm.     Heart sounds: Normal heart sounds. No murmur heard.    No friction rub.  Pulmonary:     Effort: Pulmonary effort is normal. No respiratory distress.     Breath sounds: Normal breath sounds. No rales.  Chest:     Chest wall: No tenderness.  Abdominal:     General: Bowel sounds are normal. There is no distension.     Palpations: Abdomen is soft.     Tenderness: There is no abdominal tenderness.  Musculoskeletal:        General: No tenderness or deformity. Normal range of motion.     Cervical back: Normal range of motion and neck supple.  Lymphadenopathy:     Cervical: No cervical adenopathy.  Skin:    General: Skin is warm and dry.     Findings: No erythema.  Neurological:     Mental Status: She is alert and oriented to person, place, and time.     Cranial Nerves: No cranial nerve deficit.     Motor: No abnormal muscle tone.     Coordination: Coordination normal.  Psychiatric:        Mood and Affect: Mood and affect normal.   Breast exam was performed in seated  position. History of  right breast lumpectomy with a well-healed surgical scar. No palpable breast mass bilaterally. No palpable axillary lymphadenopathy.    RADIOGRAPHIC STUDIES: I have personally reviewed the radiological images as listed and agreed with the findings in the report. No results found.

## 2024-06-22 NOTE — Progress Notes (Signed)
Pt in for 6 month follow up, denies any difficulties or concerns.

## 2024-06-22 NOTE — Assessment & Plan Note (Addendum)
 Improved.  Recommend calcium and vitamin D supplementation.

## 2024-06-23 LAB — CANCER ANTIGEN 15-3: CA 15-3: 43.2 U/mL — ABNORMAL HIGH (ref 0.0–25.0)

## 2024-06-23 LAB — CANCER ANTIGEN 27.29: CA 27.29: 50.6 U/mL — ABNORMAL HIGH (ref 0.0–38.6)

## 2024-07-01 ENCOUNTER — Other Ambulatory Visit: Payer: Self-pay | Admitting: Oncology

## 2024-07-01 DIAGNOSIS — Z1231 Encounter for screening mammogram for malignant neoplasm of breast: Secondary | ICD-10-CM

## 2024-07-02 LAB — SIGNATERA
SIGNATERA MTM READOUT: 0 MTM/ml
SIGNATERA TEST RESULT: NEGATIVE

## 2024-08-16 ENCOUNTER — Encounter

## 2024-09-10 ENCOUNTER — Encounter

## 2024-09-20 ENCOUNTER — Inpatient Hospital Stay

## 2024-12-21 ENCOUNTER — Inpatient Hospital Stay: Admitting: Oncology

## 2024-12-21 ENCOUNTER — Inpatient Hospital Stay
